# Patient Record
Sex: Male | Born: 1955 | Race: White | Hispanic: No | Marital: Single | Smoking: Current every day smoker
Health system: Southern US, Community
[De-identification: ages and names within clinical notes are randomized; demographics above are authoritative.]

## PROBLEM LIST (undated history)

## (undated) DIAGNOSIS — Z720 Tobacco use: Secondary | ICD-10-CM

## (undated) DIAGNOSIS — F1010 Alcohol abuse, uncomplicated: Secondary | ICD-10-CM

---

## 2019-11-15 ENCOUNTER — Encounter (HOSPITAL_COMMUNITY): Payer: Self-pay

## 2019-11-15 ENCOUNTER — Emergency Department (HOSPITAL_COMMUNITY)
Admission: EM | Admit: 2019-11-15 | Discharge: 2019-11-17 | Disposition: A | Discharge status: Home / Self Care | Payer: No Typology Code available for payment source | Attending: Emergency Medicine | Admitting: Emergency Medicine

## 2019-11-15 ENCOUNTER — Other Ambulatory Visit: Payer: Self-pay

## 2019-11-15 DIAGNOSIS — F1010 Alcohol abuse, uncomplicated: Secondary | ICD-10-CM

## 2019-11-15 DIAGNOSIS — F10920 Alcohol use, unspecified with intoxication, uncomplicated: Secondary | ICD-10-CM

## 2019-11-15 DIAGNOSIS — R45851 Suicidal ideations: Secondary | ICD-10-CM | POA: Insufficient documentation

## 2019-11-15 DIAGNOSIS — F1924 Other psychoactive substance dependence with psychoactive substance-induced mood disorder: Secondary | ICD-10-CM | POA: Diagnosis not present

## 2019-11-15 DIAGNOSIS — Y903 Blood alcohol level of 60-79 mg/100 ml: Secondary | ICD-10-CM | POA: Diagnosis not present

## 2019-11-15 DIAGNOSIS — F1020 Alcohol dependence, uncomplicated: Secondary | ICD-10-CM | POA: Diagnosis not present

## 2019-11-15 DIAGNOSIS — F10229 Alcohol dependence with intoxication, unspecified: Secondary | ICD-10-CM | POA: Diagnosis present

## 2019-11-15 DIAGNOSIS — Z20822 Contact with and (suspected) exposure to covid-19: Secondary | ICD-10-CM | POA: Diagnosis not present

## 2019-11-15 LAB — COMPREHENSIVE METABOLIC PANEL
ALT: 73 U/L — ABNORMAL HIGH (ref 0–44)
AST: 154 U/L — ABNORMAL HIGH (ref 15–41)
Albumin: 3.5 g/dL (ref 3.5–5.0)
Alkaline Phosphatase: 101 U/L (ref 38–126)
Anion gap: 16 — ABNORMAL HIGH (ref 5–15)
BUN: <5 mg/dL — ABNORMAL LOW (ref 8–23)
CO2: 22 mmol/L (ref 22–32)
Calcium: 8.7 mg/dL — ABNORMAL LOW (ref 8.9–10.3)
Chloride: 91 mmol/L — ABNORMAL LOW (ref 98–111)
Creatinine, Ser: 0.71 mg/dL (ref 0.61–1.24)
GFR calc Af Amer: >60 mL/min (ref >60)
GFR calc non Af Amer: >60 mL/min (ref >60)
Glucose, Bld: 97 mg/dL (ref 70–99)
Potassium: 3.6 mmol/L (ref 3.5–5.1)
Sodium: 129 mmol/L — ABNORMAL LOW (ref 135–145)
Total Bilirubin: 1.4 mg/dL — ABNORMAL HIGH (ref 0.3–1.2)
Total Protein: 6.6 g/dL (ref 6.5–8.1)

## 2019-11-15 LAB — CBC WITH DIFFERENTIAL/PLATELET
Abs Immature Granulocytes: 0.01 10*3/uL (ref 0.00–0.07)
Basophils Absolute: 0 10*3/uL (ref 0.0–0.1)
Basophils Relative: 1 %
Eosinophils Absolute: 0 10*3/uL (ref 0.0–0.5)
Eosinophils Relative: 1 %
HCT: 48.9 % (ref 39.0–52.0)
Hemoglobin: 16.9 g/dL (ref 13.0–17.0)
Immature Granulocytes: 0 %
Lymphocytes Relative: 33 %
Lymphs Abs: 1.1 10*3/uL (ref 0.7–4.0)
MCH: 33.9 pg (ref 26.0–34.0)
MCHC: 34.6 g/dL (ref 30.0–36.0)
MCV: 98.2 fL (ref 80.0–100.0)
Monocytes Absolute: 0.3 10*3/uL (ref 0.1–1.0)
Monocytes Relative: 9 %
Neutro Abs: 1.8 10*3/uL (ref 1.7–7.7)
Neutrophils Relative %: 56 %
Platelets: 101 10*3/uL — ABNORMAL LOW (ref 150–400)
RBC: 4.98 MIL/uL (ref 4.22–5.81)
RDW: 13.4 % (ref 11.5–15.5)
WBC: 3.3 10*3/uL — ABNORMAL LOW (ref 4.0–10.5)
nRBC: 0 % (ref 0.0–0.2)

## 2019-11-15 LAB — RAPID URINE DRUG SCREEN, HOSP PERFORMED
Amphetamines: NOT DETECTED
Barbiturates: NOT DETECTED
Benzodiazepines: NOT DETECTED
Cocaine: NOT DETECTED
Opiates: NOT DETECTED
Tetrahydrocannabinol: NOT DETECTED

## 2019-11-15 LAB — ETHANOL: Alcohol, Ethyl (B): 75 mg/dL — ABNORMAL HIGH (ref <10)

## 2019-11-15 LAB — RESPIRATORY PANEL BY RT PCR (FLU A&B, COVID)
Influenza A by PCR: NEGATIVE
Influenza B by PCR: NEGATIVE
SARS Coronavirus 2 by RT PCR: NEGATIVE

## 2019-11-15 MED ORDER — NICOTINE 21 MG/24HR TD PT24
21.0000 mg | MEDICATED_PATCH | Freq: Every day | TRANSDERMAL | Status: DC
  Administered 2019-11-15 – 2019-11-16 (×2): 21 mg via TRANSDERMAL
  Filled 2019-11-15 (×2): qty 1

## 2019-11-15 MED ORDER — THIAMINE HCL 100 MG/ML IJ SOLN: 100.0000 mg | Freq: Every day | INTRAMUSCULAR | Status: DC

## 2019-11-15 MED ORDER — ONDANSETRON HCL 4 MG PO TABS: 4.0000 mg | ORAL_TABLET | Freq: Three times a day (TID) | ORAL | Status: DC | PRN

## 2019-11-15 MED ORDER — LORAZEPAM 1 MG PO TABS
0.0000 mg | ORAL_TABLET | Freq: Four times a day (QID) | ORAL | Status: DC
  Administered 2019-11-15: 2 mg via ORAL
  Filled 2019-11-15: qty 2

## 2019-11-15 MED ORDER — LORAZEPAM 2 MG/ML IJ SOLN: 0.0000 mg | Freq: Four times a day (QID) | INTRAMUSCULAR | Status: DC

## 2019-11-15 MED ORDER — LORAZEPAM 1 MG PO TABS: 0.0000 mg | ORAL_TABLET | Freq: Two times a day (BID) | ORAL | Status: DC

## 2019-11-15 MED ORDER — IBUPROFEN 200 MG PO TABS: 600.0000 mg | ORAL_TABLET | Freq: Three times a day (TID) | ORAL | Status: DC | PRN

## 2019-11-15 MED ORDER — THIAMINE HCL 100 MG PO TABS
100.0000 mg | ORAL_TABLET | Freq: Every day | ORAL | Status: DC
  Administered 2019-11-15 – 2019-11-16 (×2): 100 mg via ORAL
  Filled 2019-11-15 (×2): qty 1

## 2019-11-15 MED ORDER — LORAZEPAM 2 MG/ML IJ SOLN: 0.0000 mg | Freq: Two times a day (BID) | INTRAMUSCULAR | Status: DC

## 2019-11-15 MED ORDER — ALUM & MAG HYDROXIDE-SIMETH 200-200-20 MG/5ML PO SUSP: 30.0000 mL | Freq: Four times a day (QID) | ORAL | Status: DC | PRN

## 2019-11-15 NOTE — ED Triage Notes (Signed)
Pt BIB by [employer]. Pt has been IVC'd by sister due to being unable to care for himself related to excessive ETOH use. Pt has been refusing to care for himself and listen to his family. Family has attempted to help patient but patient continues to refuse and becomes verbally aggressive with them. Pt has been calm and cooperative with officers. Pt appears to be intoxicated at this time. Pt reports having depression and SI over the last year after his mother's passing. Pt reports increased drinking since his mother's passing over a year ago. Pt reports if he "had the guts" he would shoot and kill himself with a gun.

## 2019-11-15 NOTE — ED Provider Notes (Signed)
[facility]-EMERGENCY DEPT Provider Note   CSN: [id number] Arrival date & time: 11/15/19  1353     History Chief Complaint  Patient presents with  . IVC  . Alcohol Intoxication  . Suicidal    William [name] is a 64 y.o. male.  64 year old male with history of alcohol abuse presents under IVC.  Family concerned that patient cannot take care of himself.  Patient also expresses suicidal ideations without definitive plan.  Denies any prior history of suicide attempt he notes he has been more depressed recently.  States that he drinks about 9 beers every day.  Last use of alcohol was today.  Denies any abdominal pain or change in his stools.  No emesis noted.  Has also had poor nutrition as well.        History reviewed. No pertinent past medical history.  There are no problems to display for this patient.   History reviewed. No pertinent surgical history.     History reviewed. No pertinent family history.  Social History   Tobacco Use  . Smoking status: Not on file  Substance Use Topics  . Alcohol use: Not on file  . Drug use: Not on file    Home Medications Prior to Admission medications   Not on File    Allergies    Patient has no allergy information on record.  Review of Systems   Review of Systems  All other systems reviewed and are negative.   Physical Exam Updated Vital Signs BP 101/80 (BP Location: Left Arm)   Pulse 78   Temp 97.6 F (36.4 C) (Oral)   Resp 18   SpO2 99%   Physical Exam Vitals and nursing note reviewed.  Constitutional:      General: He is not in acute distress.    Appearance: Normal appearance. He is well-developed. He is not toxic-appearing.  HENT:     Head: Normocephalic and atraumatic.  Eyes:     General: Lids are normal.     Conjunctiva/sclera: Conjunctivae normal.     Pupils: Pupils are equal, round, and reactive to light.  Neck:     Thyroid: No thyroid mass.     Trachea: No tracheal deviation.    Cardiovascular:     Rate and Rhythm: Normal rate and regular rhythm.     Heart sounds: Normal heart sounds. No murmur. No gallop.   Pulmonary:     Effort: Pulmonary effort is normal. No respiratory distress.     Breath sounds: Normal breath sounds. No stridor. No decreased breath sounds, wheezing, rhonchi or rales.  Abdominal:     General: Bowel sounds are normal. There is no distension.     Palpations: Abdomen is soft.     Tenderness: There is no abdominal tenderness. There is no rebound.  Musculoskeletal:        General: No tenderness. Normal range of motion.     Cervical back: Normal range of motion and neck supple.  Skin:    General: Skin is warm and dry.     Findings: No abrasion or rash.  Neurological:     General: No focal deficit present.     Mental Status: He is alert and oriented to person, place, and time.     GCS: GCS eye subscore is 4. GCS verbal subscore is 5. GCS motor subscore is 6.     Cranial Nerves: No cranial nerve deficit.     Sensory: No sensory deficit.  Psychiatric:  Attention and Perception: Attention normal.        Mood and Affect: Affect is flat.        Speech: Speech normal.        Behavior: Behavior is withdrawn.        Thought Content: Thought content includes suicidal ideation. Thought content does not include suicidal plan.     ED Results / Procedures / Treatments   Labs (all labs ordered are listed, but only abnormal results are displayed) Labs Reviewed  ETHANOL  RAPID URINE DRUG SCREEN, HOSP PERFORMED  CBC WITH DIFFERENTIAL/PLATELET  COMPREHENSIVE METABOLIC PANEL    EKG None  Radiology No results found.  Procedures Procedures (including critical care time)  Medications Ordered in ED Medications - No data to display  ED Course  I have reviewed the triage vital signs and the nursing notes.  Pertinent labs & imaging results that were available during my care of the patient were reviewed by me and considered in my medical  decision making (see chart for details).    MDM Rules/Calculators/A&P                     Will check laboratory studies and medically clear patient and placed on CIWA while and have behavioral health see Final Clinical Impression(s) / ED Diagnoses Final diagnoses:  None    Rx / DC Orders ED Discharge Orders    None       Lacretia Leigh, MD 11/15/19 1453

## 2019-11-16 ENCOUNTER — Encounter (HOSPITAL_COMMUNITY): Payer: Self-pay | Admitting: *Deleted

## 2019-11-16 NOTE — ED Notes (Signed)
Pt provided with meal tray at this time 

## 2019-11-16 NOTE — Progress Notes (Addendum)
CSW called back to insure pt has no guns in his home or at his disposal and his sister Clydie Braun stated no, that pt does not own, nor has access to any guns or weapons.  CSW will continue to follow for D/C needs.  Dorothe Pea. Brek Reece  MSW, LCSW, LCAS, CSI Transitions of Care Clinical Social Worker Care Coordination Department Ph: [phone]

## 2019-11-16 NOTE — Progress Notes (Addendum)
[facility] did not receive pt's referral for SUD [place].  CSW resent to : [phone].  CSW spoke to [facility] who stated pt was denied due to a need for physical assistance.  EDP updated.  CSW will continue to follow for D/C needs.  Dorothe Pea. Martie Muhlbauer  MSW, LCSW, LCAS, CSI Transitions of Care Clinical Social Worker Care Coordination Department Ph: [phone]

## 2019-11-16 NOTE — ED Notes (Signed)
Patient resting comfortably. Per Nicola Girt with social work, patient will go home with home health if sister can agree to regularly check on patient and get home set up for patient to return. Will call TCU and let this RN know when patient can be transported home.

## 2019-11-16 NOTE — Progress Notes (Addendum)
CSW updated pt's sister Clydie Braun at ph: [phone] who voiced understanding that pt would be D/C'd and was agreeable to being present when the pt arrives via PTAR to assist him in getting comfortable for the night in his trailer.  Per pt's sister Clydie Braun, pt's sister Diane and Diane's son lives on the same property as the pt and can also check in on the pt as well as be present when the pt arrives.  Per pt's sister Clydie Braun, pt's sister Diane and her son will go to the pt's trailer and clean up prior to the pt returning due to there being, "beer cans everywhere, even covering the floor", per pt's sister.  8:54 PM CSW received a call from pt's brother-in-law who accepted information for recovery options including [facility] in [place], [place].  Pt's brother stated the CSW calling APS will not be helpful as pt has a hx of not accepting help or opening his door when help is offered.  CSW will continue to follow for D/C needs.  Dorothe Pea. Vianey Caniglia  MSW, LCSW, LCAS, CSI Transitions of Care Clinical Social Worker Care Coordination Department Ph: [phone]

## 2019-11-16 NOTE — ED Notes (Signed)
TTS assessment being done at this time.  

## 2019-11-16 NOTE — ED Notes (Signed)
Sitter remains at bedside. Pt is calm and cooperative at this time. Pt is alert and oriented. Will continue to monitor.

## 2019-11-16 NOTE — ED Notes (Signed)
TTS consulted with pt at this time. Peer support ordered for this pt.

## 2019-11-16 NOTE — ED Notes (Signed)
[name] sister would like an update, [phone].

## 2019-11-16 NOTE — Progress Notes (Signed)
Case mgmt thought this man would appreciate a visit.  Chaplain met him and had conversation.  He asked about what chaplains do and I said  We can be like a pastor in a hospital.  He started expressing guilt of not going to church - used to go regularly but then  he started drinking.  I helped him understand lots of people have something that they use to help them with difficult things.(Eating, drinking, etc.   He was really hard on himself .  He referred to being in a deep dark place and hard to seek any way out.. I sought to encourage him to do to let the program help him and do what he can to come out of that dark place. We talked about God loving his children no matter who they are and what has happened.  He warmed up to that.  He said his sister set it up cause she was worried about him.  He is on day 2  I think and he said he had been in a hallway somewhere upstairs yesterday and how he did not like that and couldn't sleep with all the hospital noises and patients and being out  in the open.  He liked the room he was in much better in the ED 32   He invited me to come back anytime that he would love to have another visit.   He welcomed me to pray with him.  I had prayer being thankful for the good days and for the tough days when they come that he could look at them as only temporary days.  Its like a book and he is writing a new chapter.  He can turn the page and no looking back unless he wants to.  He seemed encouraged.  S , Chaplain    11/16/19 1400  Clinical Encounter Type  Visited With Patient  Visit Type Initial;Spiritual support;Other (Comment)  Referral From Care management  Consult/Referral To Chaplain  Spiritual Encounters  Spiritual Needs Prayer;Other (Comment)  Stress Factors  Patient Stress Factors Other (Comment) (police came knocking on his door)   

## 2019-11-16 NOTE — ED Provider Notes (Signed)
9:45 p.m.-I was informed by transition of care team that the patient cannot be placed for treatment and that he is comfortable with discharge.  At this time he is alert and sober.  He understands it is important to stop drinking understands the mechanisms to do that.  Family members are apparently on the way to pick him up.   Mancel Bale, MD 11/16/19 2156

## 2019-11-16 NOTE — Evaluation (Addendum)
Physical Therapy Evaluation Patient Details Name: William [name] MRN: [id number] DOB: Jul 09, 1956 Today's Date: 11/16/2019   History of Present Illness  64 year old male with history of alcohol abuse presents under IVC.  Family concerned that patient cannot take care of himself.  Patient also expresses suicidal ideations without definitive plan.  Denies any prior history of suicide attempt he notes he has been more depressed recently. IVC rescinded.  Clinical Impression  The patient is very pleasant. Presents with decreased balance responses with ambulation if not supporting with UE. He may benefit from A Rw for stability while recovering strength. Patient did best with RW while ambulating. Patient could benefit from a short term rehab to improve strength  And independence. Noted to cruise on walls /bed in room without device. Pt admitted with above diagnosis.  Pt currently with functional limitations due to the deficits listed below (see PT Problem List). Pt will benefit from skilled PT to increase their independence and safety with mobility to allow discharge to the venue listed below.   .    Follow Up Recommendations SNF;Supervision for mobility/OOBif agreeable and eligible otherwise home with HHPT and RW and some support      Equipment Recommendations  Rolling walker with 5" wheels    Recommendations for Other Services       Precautions / Restrictions Precautions Precautions: Fall      Mobility  Bed Mobility Overal bed mobility: Independent                Transfers Overall transfer level: Needs assistance Equipment used: None Transfers: Sit to/from Stand Sit to Stand: Supervision         General transfer comment: extra effort from low seat. bed raised and stood, wide base, knees flexed  Ambulation/Gait Ambulation/Gait assistance: Min guard;Min assist Gait Distance (Feet): 80 Feet Assistive device: Rolling walker (2 wheeled) Gait Pattern/deviations: Step-through  pattern;Shuffle;Trunk flexed Gait velocity: decr   General Gait Details: ambulated x 30' without Ad with noted slower speed, reaching for walls to steady self  Stairs            Wheelchair Mobility    Modified Rankin (Stroke Patients Only)       Balance Overall balance assessment: History of Falls;Needs assistance   Sitting balance-Leahy Scale: Good     Standing balance support: No upper extremity supported;During functional activity Standing balance-Leahy Scale: Poor Standing balance comment: static is fair, duynamic without  holding on with 1 UE is poor.                             Pertinent Vitals/Pain Pain Assessment: Faces Faces Pain Scale: Hurts little more Pain Location: back Pain Descriptors / Indicators: Discomfort Pain Intervention(s): Monitored during session    Home Living Family/patient expects to be discharged to:: Private residence Living Arrangements: Alone Available Help at Discharge: Family;Available PRN/intermittently Type of Home: Mobile home Home Access: Stairs to enter Entrance Stairs-Rails: Psychiatric nurse of Steps: 3 Home Layout: One level Home Equipment: None      Prior Function Level of Independence: Independent         Comments: drives     Hand Dominance        Extremity/Trunk Assessment   Upper Extremity Assessment Upper Extremity Assessment: Overall WFL for tasks assessed    Lower Extremity Assessment Lower Extremity Assessment: Generalized weakness    Cervical / Trunk Assessment Cervical / Trunk Assessment: Kyphotic  Communication   Communication: No  difficulties  Cognition Arousal/Alertness: Awake/alert Behavior During Therapy: WFL for tasks assessed/performed Overall Cognitive Status: Within Functional Limits for tasks assessed                                 General Comments: knows hospital setting and date      General Comments      Exercises General  Exercises - Lower Extremity Long Arc Quad: 10 reps;AROM Heel Slides: Seated Hip Flexion/Marching: 10 reps;Both;Seated   Assessment/Plan    PT Assessment Patient needs continued PT services  PT Problem List Decreased strength;Decreased balance;Decreased knowledge of precautions;Decreased mobility;Decreased knowledge of use of DME;Decreased activity tolerance;Decreased skin integrity       PT Treatment Interventions DME instruction;Functional mobility training;Gait training;Therapeutic activities;Therapeutic exercise    PT Goals (Current goals can be found in the Care Plan section)  Acute Rehab PT Goals Patient Stated Goal: I want to go somewhere to stop drinking PT Goal Formulation: With patient Time For Goal Achievement: 11/30/19 Potential to Achieve Goals: Fair    Frequency Min 3X/week   Barriers to discharge Decreased caregiver support      Co-evaluation               AM-PAC PT "6 Clicks" Mobility  Outcome Measure Help needed turning from your back to your side while in a flat bed without using bedrails?: None Help needed moving from lying on your back to sitting on the side of a flat bed without using bedrails?: None Help needed moving to and from a bed to a chair (including a wheelchair)?: A Little Help needed standing up from a chair using your arms (e.g., wheelchair or bedside chair)?: A Little Help needed to walk in hospital room?: A Little Help needed climbing 3-5 steps with a railing? : A Lot 6 Click Score: 19    End of Session Equipment Utilized During Treatment: Gait belt Activity Tolerance: Patient tolerated treatment well Patient left: in bed;with nursing/sitter in room Nurse Communication: Mobility status PT Visit Diagnosis: Unsteadiness on feet (R26.81);Difficulty in walking, not elsewhere classified (R26.2);Repeated falls (R29.6)    Time: 6767-2094 PT Time Calculation (min) (ACUTE ONLY): 31 min   Charges:   PT Evaluation $PT Eval Low  Complexity: 1 Low PT Treatments $Gait Training: 8-22 mins        Blanchard Kelch PT Acute Rehabilitation Services Pager [phone] Office [phone]   Rada Hay 11/16/2019, 3:25 PM

## 2019-11-16 NOTE — Progress Notes (Signed)
RN updated and will update pt's sister Clydie Braun at ph: [phone] whe PTAR is in transit to the pt's home with the pt.  CSW will continue to follow for D/C needs.  Dorothe Pea. Haruto Demaria  MSW, LCSW, LCAS, CSI Transitions of Care Clinical Social Worker Care Coordination Department Ph: [phone]

## 2019-11-16 NOTE — Consult Note (Signed)
William [name]  11/16/2019 11:14 AM William [name]  MRN:  [id number] Principal Problem: <principal problem not specified> [name] Diagnoses: Active Problems:   * No active hospital problems. *   Subjective: "My sister worries because I was drinking too much and falling a lot."  Patient assessed by nurse practitioner along with Dr. Lucianne Muss.  Patient alert and oriented, answers appropriately. Patient denies suicidal and homicidal ideations. Patient denies history of suicide attempts, denies history of self-harm behaviors. Patient denies auditory and visual hallucinations. Patient denies symptoms of paranoia.  Patient reports he lives alone in [place].  Patient denies access to weapons.  Patient reports independent of activities of daily living.  Patient denies use of assistive devices. Patient reports desire to "cut down on drinking and cigarette smoking."     Total Time spent with patient: 20 minutes  Past Psychiatric History:   Past Medical History: History reviewed. No pertinent past medical history. History reviewed. No pertinent surgical history. Family History: History reviewed. No pertinent family history. Family Psychiatric  History: Denies Social History:  Social History   Substance and Sexual Activity  Alcohol Use None     Social History   Substance and Sexual Activity  Drug Use Not on file    Social History   Socioeconomic History  . Marital status: Single    Spouse name: Not on file  . Number of children: Not on file  . Years of education: Not on file  . Highest education level: Not on file  Occupational History  . Not on file  Tobacco Use  . Smoking status: Not on file  Substance and Sexual Activity  . Alcohol use: Not on file  . Drug use: Not on file  . Sexual activity: Not on file  Other Topics Concern  . Not on file  Social History Narrative  . Not on file   Social Determinants of Health   Financial Resource Strain:   . Difficulty of  Paying Living Expenses:   Food Insecurity:   . Worried About Programme researcher, broadcasting/film/video in the Last Year:   . Barista in the Last Year:   Transportation Needs:   . Freight forwarder (Medical):   Marland Kitchen Lack of Transportation (Non-Medical):   Physical Activity:   . Days of Exercise per Week:   . Minutes of Exercise per Session:   Stress:   . Feeling of Stress :   Social Connections:   . Frequency of Communication with Friends and Family:   . Frequency of Social Gatherings with Friends and Family:   . Attends Religious Services:   . Active Member of Clubs or Organizations:   . Attends Banker Meetings:   Marland Kitchen Marital Status:     Has this patient used any form of tobacco in the last 30 days? (Cigarettes, Smokeless Tobacco, Cigars, and/or Pipes) A prescription for an FDA-approved tobacco cessation medication was offered at [name] and the patient refused  Current Medications: Current Facility-Administered Medications  Medication Dose Route Frequency Provider Last Rate Last Admin  . alum & mag hydroxide-simeth (MAALOX/MYLANTA) 200-200-20 MG/5ML suspension 30 mL  30 mL Oral Q6H PRN Lorre Nick, MD      . ibuprofen (ADVIL) tablet 600 mg  600 mg Oral Q8H PRN Lorre Nick, MD      . LORazepam (ATIVAN) injection 0-4 mg  0-4 mg Intravenous Q6H Lorre Nick, MD       Or  . LORazepam (ATIVAN) tablet 0-4 mg  0-4 mg Oral Q6H Lorre Nick, MD   2 mg at 11/15/19 2121  . [START ON 11/17/2019] LORazepam (ATIVAN) injection 0-4 mg  0-4 mg Intravenous Q12H Lorre Nick, MD       Or  . Melene Muller ON 11/17/2019] LORazepam (ATIVAN) tablet 0-4 mg  0-4 mg Oral Q12H Lorre Nick, MD      . nicotine (NICODERM CQ - dosed in mg/24 hours) patch 21 mg  21 mg Transdermal Daily Lorre Nick, MD   21 mg at 11/16/19 1043  . ondansetron (ZOFRAN) tablet 4 mg  4 mg Oral Q8H PRN Lorre Nick, MD      . thiamine tablet 100 mg  100 mg Oral Daily Lorre Nick, MD   100 mg at 11/16/19 1043   Or  .  thiamine (B-1) injection 100 mg  100 mg Intravenous Daily Lorre Nick, MD       No current outpatient medications on file.   PTA Medications: (Not in a hospital admission)   Musculoskeletal: Strength & Muscle Tone: within normal limits Gait & Station: unsteady Patient leans: unable to assess  Psychiatric Specialty Exam: Physical Exam Vitals and nursing note reviewed.  Constitutional:      Appearance: He is well-developed.  HENT:     Head: Normocephalic.  Cardiovascular:     Rate and Rhythm: Normal rate.  Pulmonary:     Effort: Pulmonary effort is normal.  Neurological:     Mental Status: He is alert and oriented to person, place, and time.  Psychiatric:        Attention and Perception: Attention normal.        Mood and Affect: Mood is depressed.        Speech: Speech normal.        Behavior: Behavior normal. Behavior is cooperative.        Thought Content: Thought content normal.        Cognition and Memory: Cognition normal.        Judgment: Judgment normal.     Review of Systems  Constitutional: Negative.   HENT: Negative.   Eyes: Negative.   Respiratory: Negative.   Cardiovascular: Negative.   Gastrointestinal: Negative.   Genitourinary: Negative.   Musculoskeletal: Negative.   Skin: Negative.   Neurological: Negative.   Psychiatric/Behavioral: Negative.     Blood pressure (!) 107/52, pulse 78, temperature 97.9 F (36.6 C), temperature source Oral, resp. rate 16, SpO2 100 %.There is no height or weight on file to calculate BMI.  General Appearance: Casual  Eye Contact:  Fair  Speech:  Clear and Coherent and Normal Rate  Volume:  Normal  Mood:  Depressed  Affect:  Appropriate and Congruent  Thought Process:  Coherent, Goal Directed and Descriptions of Associations: Intact  Orientation:  Full (Time, Place, and Person)  Thought Content:  WDL and Logical  Suicidal Thoughts:  No  Homicidal Thoughts:  No  Memory:  Immediate;   Good Recent;    Good Remote;   Good  Judgement:  Fair  Insight:  Good  Psychomotor Activity:  Normal  Concentration:  Concentration: Good and Attention Span: Good  Recall:  Good  Fund of Knowledge:  Good  Language:  Good  Akathisia:  No  Handed:  Right  AIMS (if indicated):     Assets:  Communication Skills Desire for Improvement Financial Resources/Insurance Housing Intimacy Leisure Time Physical Health Resilience Social Support Transportation  ADL's:  Unable to assess   Cognition:  WNL  Sleep:  Demographic Factors:  Male and Caucasian  Loss Factors: NA  Historical Factors: NA  Risk Reduction Factors:   Positive social support, Positive therapeutic relationship and Positive coping skills or problem solving skills  Continued Clinical Symptoms:  Alcohol/Substance Abuse/Dependencies  Cognitive Features That Contribute To Risk:  None    Suicide Risk:  Minimal: No identifiable suicidal ideation.  Patients presenting with no risk factors but with morbid ruminations; may be classified as minimal risk based on the severity of the depressive symptoms    Plan Of Care/Follow-up recommendations:  Other:  Follow up with substance use treatment resources provided by Peer Support Specialist  Disposition:  Cleared by psychiatry Emmaline Kluver, [place] 11/16/2019, 11:14 AM

## 2019-11-16 NOTE — Progress Notes (Addendum)
  TOC CM contacted [facility] rep, Kathlene November and arranged HH. States it takes 7-10 days to get auth from [place] for [facility]. Contacted [facility] for RW for dc as recommended by PT. RW will be delivered to his room 32. Waiting on disposition Detox Program at [facility] with [facility]. Chaplain consulted for Spiritual Needs. Pt states he is ready to get better and stop drinking. Isidoro Donning RN CCM, [facility] ED TOC Caryl Ada [phone]  11/20/2019 late entry [employer] confirmed sister paid out of pocket for pt to receive RW for home. Isidoro Donning RN CCM, [facility] ED TOC CM [phone]

## 2019-11-16 NOTE — TOC Initial Note (Addendum)
Transition of Care [facility]) - Initial/Assessment Note    Patient Details  Name: William [name] MRN: [id number] Date of Birth: 1955-12-31  Transition of Care [facility]) CM/SW Contact:    Erenest Rasher, RN Phone Number: [phone] 11/16/2019, 1:36 PM  Clinical Narrative:                 TOC CM spoke to pt and states he really would like to get help with the drinking. He feels more depressed after his mother passed a year ago. He has two sisters who are concerned about his well-being and excessive drinking. Gave permission to speak to Santiago Glad and Shauna Hugh to give updates. Waiting response back to see if pt can go to IP substance abuse program. PT evaluation for gait and needed DME. Pt states he still drives. Goes to [facility], [facility] and send progress notes to his PCP. He receives a monthly check, and lives in his own mobile home.   TOC CM contacted sister and gave her an update on waiting acceptance at IP rehab program.     Expected Discharge Plan: IP Rehab Facility Barriers to Discharge: Active Substance Use - Placement   Patient Goals and CMS Choice Patient states their goals for this hospitalization and ongoing recovery are:: patient feels he wants to get better      Expected Discharge Plan and Services Expected Discharge Plan: [place] In-house Referral: Clinical Social Work Discharge Planning Services: CM Consult   Living arrangements for the past 2 months: Mobile Home                                      Prior Living Arrangements/Services Living arrangements for the past 2 months: Mobile Home Lives with:: Self          Need for Family Participation in Patient Care: Yes (Comment) Care giver support system in place?: No (comment)   Criminal Activity/Legal Involvement Pertinent to Current Situation/Hospitalization: No - Comment as needed  Activities of Daily Living Home Assistive Devices/Equipment: None ADL Screening (condition at  time of admission) Patient's cognitive ability adequate to safely complete daily activities?: Yes Is the patient deaf or have difficulty hearing?: No Does the patient have difficulty seeing, even when wearing glasses/contacts?: No Does the patient have difficulty concentrating, remembering, or making decisions?: No Patient able to express need for assistance with ADLs?: Yes Does the patient have difficulty dressing or bathing?: No Independently performs ADLs?: Yes (appropriate for developmental age) Does the patient have difficulty walking or climbing stairs?: No Weakness of Legs: None Weakness of Arms/Hands: None  Permission Sought/Granted Permission sought to share information with : Case Manager, Customer service manager, PCP, Family Supports Permission granted to share information with : Yes, Verbal Permission Granted  Share Information with NAME: Denny Levy  Permission granted to share info w AGENCY: IP Substance Abuse Facilities  Permission granted to share info w Relationship: sister  Permission granted to share info w Contact Information: [phone]  Emotional Assessment Appearance:: Appears older than stated age Attitude/Demeanor/Rapport: Engaged Affect (typically observed): Accepting, Pleasant Orientation: : Oriented to Self, Oriented to Place, Oriented to  Time, Oriented to Situation Alcohol / Substance Use: Tobacco Use, Alcohol Use Psych Involvement: No (comment)  Admission diagnosis:  IVC There are no problems to display for this patient.  PCP:  Clinic, [place]:   [id number] - [place], [place] -  [address] [place] [address] Phone: [phone] Fax: [phone]     Social Determinants of Health (SDOH) Interventions    Readmission Risk Interventions No flowsheet data found.

## 2019-11-16 NOTE — Discharge Instructions (Signed)
For your behavioral health needs, you are advised to follow up with the [facility].  Contact them at your earliest opportunity for an appointment:       [facility]      [address] [place], [place] [address]      [phone]  For supportive services for veterans in the [place] area, contact the [facility]:       [facility]      [address], [address]      [place], [place] [address]      [phone]      Hours of operation: 8:00 am - 7:00 pm, Monday - Friday  For crisis services for veterans, call the [employer], available 24 hours a day, 7 days a week:       [employer] [facility]      [phone]

## 2019-11-16 NOTE — ED Provider Notes (Signed)
Emergency Medicine Observation Re-evaluation Note  Dario Yono is a 64 y.o. male, seen on rounds today.  Pt initially presented to the ED for complaints of IVC, Alcohol Intoxication, and Suicidal Currently, the patient is in bed with no new complaints.  Physical Exam  BP (!) 113/92 (BP Location: Right Arm)   Pulse 79   Temp 98 F (36.7 C) (Oral)   Resp 16   SpO2 100%  Physical Exam Sitting up in hallway bed in no distress, resting comfortably   ED Course / MDM  EKG:    I have reviewed the labs performed to date as well as medications administered while in observation.  Recent changes in the last 24 hours include TTS eval awaiting reassessment in am. Plan  Current plan is for reassessment this am. Patient is under full IVC at this time.   Milagros Loll, MD 11/16/19 [phone]

## 2019-11-16 NOTE — ED Notes (Signed)
Sitter at bedside, pt calm and cooperative at this time.

## 2019-11-16 NOTE — Patient Outreach (Signed)
ED Peer Support Specialist Patient Intake (Complete at intake & 30-60 Day Follow-up)  Name: William [name]  MRN: [id number]  Age: 64 y.o.   Date of Admission: 11/16/2019  Intake:   Comments:      Primary Reason Admitted: Alcohol Intoxication   Lab values: Alcohol/ETOH:   Positive UDS?   Amphetamines:   Barbiturates:   Benzodiazepines:   Cocaine:   Opiates:   Cannabinoids:    Demographic information: Gender:   Ethnicity:   Marital Status:   Insurance Status:   Control and instrumentation engineer (Work Engineer, agricultural, Sales executive, etc.:   Lives with:   Living situation:    Reported Patient History: Patient reported health conditions:   Patient aware of HIV and hepatitis status:    In past year, has patient visited ED for any reason?    Number of ED visits:    Reason(s) for visit:    In past year, has patient been hospitalized for any reason?    Number of hospitalizations:    Reason(s) for hospitalization:    In past year, has patient been arrested?    Number of arrests:    Reason(s) for arrest:    In past year, has patient been incarcerated?    Number of incarcerations:    Reason(s) for incarceration:    In past year, has patient received medication-assisted treatment?    In past year, patient received the following treatments:    In past year, has patient received any harm reduction services?    Did this include any of the following?    In past year, has patient received care from a mental health provider for diagnosis other than SUD?    In past year, is this first time patient has overdosed?    Number of past overdoses:    In past year, is this first time patient has been hospitalized for an overdose?    Number of hospitalizations for overdose(s):    Is patient currently receiving treatment for a mental health diagnosis?    Patient reports experiencing difficulty participating in SUD treatment:      Most important reason(s) for this difficulty?     Has patient received prior services for treatment?    In past, patient has received services from following agencies:    Plan of Care:  Suggested follow up at these agencies/treatment centers:    Other information: Stated that he is tired of drinking so much an that he wants to seek help for his alcohol intake. [facility] is faxing Pt information to [facility] for detox services, and are waiting for reply.   Arlys John Rockie Vawter, CPSS  11/16/2019 12:50 PM

## 2019-11-16 NOTE — BH Assessment (Addendum)
Assessment Note  William [name] is an 64 y.o. male presenting to [facility] ED under IVC. Per triage note Pt BIB by [employer]. Pt has been IVC'd by sister due to being unable to care for himself related to excessive ETOH use. Pt has been refusing to care for himself and listen to his family. Family has attempted to help patient but patient continues to refuse and becomes verbally aggressive with them. Pt has been calm and cooperative with officers. Pt appears to be intoxicated at this time. Pt reports having depression and SI over the last year after his mother's passing. Pt reports increased drinking since his mother's passing over a year ago. Pt reports if he "had the guts" he would shoot and kill himself with a gun. During assessment patient was alert and oriented x4, calm and cooperative, appeared depressed with a flat affect, appearance was disheveled. When asked why patient was presenting to ED patient reported "My sister wanted me to come to the ED she's concerned about my health, drinking and smoking. I drink 24/7. I drink beer, I drink 9 beers 25oz a day."  "I've been falling a lot lately and not eating. 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 Sometimes I want hurt myself. My depression and drinking have gotten worse within the last 6 months."  Patient reported his age of first use for alcohol at 71.  Patient currently denies any withdrawal symptoms currently. Patient reports the only time he stopped drinking was "a year and a half ago for about 1 month." Patient currently denies SI/HI/AH/VH and does not appear to be responding to any internal or external stimuli.  Per NP Ada patient is recommended for observation overnight and reassess in the morning  Diagnosis: Substance-Induced Mood Disorder,  Alcohol use Disorder, Severe  Past Medical History: History reviewed. No pertinent past medical history.  History reviewed. No pertinent surgical history.  Family History: History reviewed. No pertinent family history.  Social History:  has no history on file for tobacco, alcohol, and drug.  Additional Social History:  Alcohol / Drug Use Pain Medications: See MAR Prescriptions: See MAR Over the Counter: See MAR History of alcohol / drug use?: Yes Substance #1 Name of Substance 1: Alcohol  CIWA: CIWA-Ar BP: (!) 92/55 Pulse Rate: 98 Nausea and Vomiting: no nausea and no vomiting Tactile Disturbances: none Tremor: no tremor Auditory Disturbances: not present Paroxysmal Sweats: no sweat visible Visual Disturbances: not present Anxiety: no anxiety, at ease Headache, Fullness in Head: none present Agitation: normal activity Orientation and Clouding of Sensorium: oriented and can do serial additions CIWA-Ar Total: 0 COWS:    Allergies: No Known Allergies  Home Medications: (Not in a hospital admission)   OB/GYN Status:  No LMP for male patient.  General Assessment Data [facility] of [facility]: [facility] TTS Assessment: In system Is this a Tele or Face-to-Face Assessment?: Tele Assessment Is this an Initial Assessment or a Re-assessment for this encounter?: Initial Assessment Patient Accompanied by:: N/A Language Other than English: No Living Arrangements: Other (Comment)(Private Residence) What gender do you identify as?: Male Marital status: Single Living Arrangements: Alone Can pt return to current living arrangement?: Yes Admission Status: Involuntary Petitioner: Police Is patient capable of signing voluntary admission?: No Referral Source: Other Insurance type: Therapist, sports Exam [facility]  Walk-in ONLY) Medical Exam completed: Yes  Crisis Care Plan Living Arrangements: Alone Legal Guardian: Other:(Self) Name of Psychiatrist: None Name  of Therapist: None  Education Status Is patient currently in school?: No Is the patient employed, unemployed or receiving disability?: Unemployed  Risk to self with the past 6 months Suicidal Ideation: No Has patient been a risk to self within the past 6 months prior to admission? : No Suicidal Intent: No Has patient had any suicidal intent within the past 6 months prior to admission? : No Is patient at risk for suicide?: No Suicidal Plan?: No Has patient had any suicidal plan within the past 6 months prior to admission? : No Access to Means: No What has been your use of drugs/alcohol within the last 12 months?: Alcohol Abuse Previous Attempts/Gestures: No How many times?: 0 Other Self Harm Risks: NOne Triggers for Past Attempts: None known Intentional Self Injurious Behavior: None Family Suicide History: No Recent stressful life event(s): Other (Comment)(None reported) Persecutory voices/beliefs?: No Depression: Yes Depression Symptoms: Isolating, Loss of interest in usual pleasures, Feeling worthless/self pity Substance abuse history and/or treatment for substance abuse?: Yes Suicide prevention information given to non-admitted patients: Not applicable  Risk to Others within the past 6 months Homicidal Ideation: No Does patient have any lifetime risk of violence toward others beyond the six months prior to admission? : No Thoughts of Harm to Others: No Current Homicidal Intent: No Current Homicidal Plan: No Access to Homicidal Means: No Identified Victim: None History of harm to others?: No Assessment of Violence: None Noted Does patient have access to weapons?: No Criminal Charges Pending?: No Does patient have a court date: No Is patient on probation?: No  Psychosis Hallucinations: None noted Delusions: None noted  Mental Status Report Appearance/Hygiene: In scrubs Eye Contact: Fair Motor Activity: Freedom of movement Speech: Logical/coherent Level of  Consciousness: Alert Mood: Depressed Affect: Flat Anxiety Level: None Thought Processes: Coherent Judgement: Unimpaired Orientation: Person, Place, Situation, Time, Appropriate for developmental age Obsessive Compulsive Thoughts/Behaviors: None  Cognitive Functioning Concentration: Normal Memory: Recent Intact, Remote Intact Is patient IDD: No Insight: Fair Impulse Control: Fair Appetite: Fair Have you had any weight changes? : No Change Sleep: No Change Total Hours of Sleep: 5 Vegetative Symptoms: None  ADLScreening [facility] Assessment Services) Patient's cognitive ability adequate to safely complete daily activities?: Yes Patient able to express need for assistance with ADLs?: Yes Independently performs ADLs?: Yes (appropriate for developmental age)  Prior Inpatient Therapy Prior Inpatient Therapy: No  Prior Outpatient Therapy Prior Outpatient Therapy: Yes Prior Therapy Dates: Unknown Prior Therapy Facilty/Provider(s): Unknown Reason for Treatment: Unknown Does patient have an ACCT team?: No Does patient have Intensive In-House Services?  : No Does patient have [facility] services? : No Does patient have P4CC services?: No  ADL Screening (condition at time of admission) Patient's cognitive ability adequate to safely complete daily activities?: Yes Is the patient deaf or have difficulty hearing?: No Does the patient have difficulty seeing, even when wearing glasses/contacts?: No Does the patient have difficulty concentrating, remembering, or making decisions?: No Patient able to express need for assistance with ADLs?: Yes Does the patient have difficulty dressing or bathing?: No Independently performs ADLs?: Yes (appropriate for developmental age) Does the patient have difficulty walking or climbing stairs?: No Weakness of Legs: None Weakness of Arms/Hands: None  Home Assistive Devices/Equipment Home Assistive Devices/Equipment: None  Therapy Consults (therapy consults  require a physician order) PT Evaluation Needed: No OT Evalulation Needed: No SLP Evaluation Needed:  No Abuse/Neglect Assessment (Assessment to be complete while patient is alone) Abuse/Neglect Assessment Can Be Completed: Yes Physical Abuse: Denies Verbal Abuse: Denies Sexual Abuse: Denies Exploitation of patient/patient's resources: Denies Self-Neglect: Denies Values / Beliefs Cultural Requests During Hospitalization: None Spiritual Requests During Hospitalization: None Consults Spiritual Care Consult Needed: No Transition of Care Team Consult Needed: No Advance Directives (For Healthcare) Does Patient Have a Medical Advance Directive?: No Would patient like information on creating a medical advance directive?: No - Patient declined          Disposition: Per NP Ada patient is recommended for observation overnight and reassess in the morning Disposition Initial Assessment Completed for this Encounter: Yes  On Site Evaluation by:   Reviewed with Physician:    Benay Pike MS LCASA 11/16/2019 1:41 AM

## 2019-11-16 NOTE — BH Assessment (Signed)
[facility] Assessment Progress Note  Per Berneice Heinrich, FNP, this pt does not require psychiatric hospitalization at this time.  Pt presents under IVC initiated by sister and upheld by EDP Lorre Nick, MD, which has been rescinded by Nelly Rout, MD.  Pt is to be discharged from [facility].  Discharge instructions advise pt to follow up with the [facility], and also include other area supportive services for veterans.  Pt would also benefit from seeing Peer Support Specialists, and a peer support consult has been ordered for pt..  Pt's nurse, Dekina, has been notified.  Doylene Canning, MA Triage Specialist [phone]

## 2019-11-20 ENCOUNTER — Telehealth: Payer: Self-pay | Admitting: *Deleted

## 2019-11-20 NOTE — Telephone Encounter (Signed)
TOC CM received call from [facility], Kathlene November, agency spoke to [facility] and pt has not seen PCP in 2 years. He will need to establish with PCP before they can authorize [facility] visits. TOC CM contacted pt and sister left HIPAA compliant message for a return call. Isidoro Donning RN CCM, [facility] ED TOC CM [phone]

## 2019-12-05 ENCOUNTER — Encounter: Payer: Self-pay | Admitting: Emergency Medicine

## 2019-12-05 ENCOUNTER — Emergency Department: Payer: No Typology Code available for payment source

## 2019-12-05 ENCOUNTER — Inpatient Hospital Stay
Admission: EM | Admit: 2019-12-05 | Discharge: 2019-12-18 | DRG: 640 | Disposition: A | Discharge status: Home / Self Care | Payer: No Typology Code available for payment source | Attending: Internal Medicine | Admitting: Internal Medicine

## 2019-12-05 ENCOUNTER — Other Ambulatory Visit: Payer: Self-pay

## 2019-12-05 DIAGNOSIS — R4182 Altered mental status, unspecified: Secondary | ICD-10-CM | POA: Diagnosis not present

## 2019-12-05 DIAGNOSIS — F1020 Alcohol dependence, uncomplicated: Secondary | ICD-10-CM | POA: Diagnosis present

## 2019-12-05 DIAGNOSIS — J441 Chronic obstructive pulmonary disease with (acute) exacerbation: Secondary | ICD-10-CM

## 2019-12-05 DIAGNOSIS — D6959 Other secondary thrombocytopenia: Secondary | ICD-10-CM | POA: Diagnosis present

## 2019-12-05 DIAGNOSIS — F1010 Alcohol abuse, uncomplicated: Secondary | ICD-10-CM

## 2019-12-05 DIAGNOSIS — G92 Toxic encephalopathy: Secondary | ICD-10-CM | POA: Diagnosis present

## 2019-12-05 DIAGNOSIS — R0602 Shortness of breath: Secondary | ICD-10-CM

## 2019-12-05 DIAGNOSIS — J9601 Acute respiratory failure with hypoxia: Secondary | ICD-10-CM

## 2019-12-05 DIAGNOSIS — F1029 Alcohol dependence with unspecified alcohol-induced disorder: Secondary | ICD-10-CM

## 2019-12-05 DIAGNOSIS — K5900 Constipation, unspecified: Secondary | ICD-10-CM | POA: Diagnosis present

## 2019-12-05 DIAGNOSIS — Z66 Do not resuscitate: Secondary | ICD-10-CM | POA: Diagnosis present

## 2019-12-05 DIAGNOSIS — E512 Wernicke's encephalopathy: Secondary | ICD-10-CM | POA: Diagnosis not present

## 2019-12-05 DIAGNOSIS — R0603 Acute respiratory distress: Secondary | ICD-10-CM

## 2019-12-05 DIAGNOSIS — G9341 Metabolic encephalopathy: Secondary | ICD-10-CM | POA: Diagnosis present

## 2019-12-05 DIAGNOSIS — R000 Tachycardia, unspecified: Secondary | ICD-10-CM

## 2019-12-05 DIAGNOSIS — R1310 Dysphagia, unspecified: Secondary | ICD-10-CM | POA: Diagnosis present

## 2019-12-05 DIAGNOSIS — K760 Fatty (change of) liver, not elsewhere classified: Secondary | ICD-10-CM

## 2019-12-05 DIAGNOSIS — E876 Hypokalemia: Secondary | ICD-10-CM

## 2019-12-05 DIAGNOSIS — F17210 Nicotine dependence, cigarettes, uncomplicated: Secondary | ICD-10-CM | POA: Diagnosis present

## 2019-12-05 DIAGNOSIS — Z20822 Contact with and (suspected) exposure to covid-19: Secondary | ICD-10-CM | POA: Diagnosis present

## 2019-12-05 DIAGNOSIS — R296 Repeated falls: Secondary | ICD-10-CM | POA: Diagnosis present

## 2019-12-05 HISTORY — DX: Tobacco use: Z72.0

## 2019-12-05 HISTORY — DX: Alcohol abuse, uncomplicated: F10.10

## 2019-12-05 LAB — COMPREHENSIVE METABOLIC PANEL
ALT: 24 U/L (ref 0–44)
AST: 44 U/L — ABNORMAL HIGH (ref 15–41)
Albumin: 3.2 g/dL — ABNORMAL LOW (ref 3.5–5.0)
Alkaline Phosphatase: 83 U/L (ref 38–126)
Anion gap: 13 (ref 5–15)
BUN: 9 mg/dL (ref 8–23)
CO2: 25 mmol/L (ref 22–32)
Calcium: 8.8 mg/dL — ABNORMAL LOW (ref 8.9–10.3)
Chloride: 94 mmol/L — ABNORMAL LOW (ref 98–111)
Creatinine, Ser: 0.63 mg/dL (ref 0.61–1.24)
GFR calc Af Amer: >60 mL/min (ref >60)
GFR calc non Af Amer: >60 mL/min (ref >60)
Glucose, Bld: 126 mg/dL — ABNORMAL HIGH (ref 70–99)
Potassium: 3.3 mmol/L — ABNORMAL LOW (ref 3.5–5.1)
Sodium: 132 mmol/L — ABNORMAL LOW (ref 135–145)
Total Bilirubin: 1.8 mg/dL — ABNORMAL HIGH (ref 0.3–1.2)
Total Protein: 6.7 g/dL (ref 6.5–8.1)

## 2019-12-05 LAB — CBC WITH DIFFERENTIAL/PLATELET
Abs Immature Granulocytes: 0.04 10*3/uL (ref 0.00–0.07)
Basophils Absolute: 0 10*3/uL (ref 0.0–0.1)
Basophils Relative: 0 %
Eosinophils Absolute: 0 10*3/uL (ref 0.0–0.5)
Eosinophils Relative: 0 %
HCT: 42.2 % (ref 39.0–52.0)
Hemoglobin: 15 g/dL (ref 13.0–17.0)
Immature Granulocytes: 0 %
Lymphocytes Relative: 9 %
Lymphs Abs: 1 10*3/uL (ref 0.7–4.0)
MCH: 34.8 pg — ABNORMAL HIGH (ref 26.0–34.0)
MCHC: 35.5 g/dL (ref 30.0–36.0)
MCV: 97.9 fL (ref 80.0–100.0)
Monocytes Absolute: 0.8 10*3/uL (ref 0.1–1.0)
Monocytes Relative: 8 %
Neutro Abs: 8.8 10*3/uL — ABNORMAL HIGH (ref 1.7–7.7)
Neutrophils Relative %: 83 %
Platelets: 135 10*3/uL — ABNORMAL LOW (ref 150–400)
RBC: 4.31 MIL/uL (ref 4.22–5.81)
RDW: 13.2 % (ref 11.5–15.5)
WBC: 10.7 10*3/uL — ABNORMAL HIGH (ref 4.0–10.5)
nRBC: 0 % (ref 0.0–0.2)

## 2019-12-05 LAB — CK: Total CK: 33 U/L — ABNORMAL LOW (ref 49–397)

## 2019-12-05 LAB — MAGNESIUM: Magnesium: 1.9 mg/dL (ref 1.7–2.4)

## 2019-12-05 LAB — SARS CORONAVIRUS 2 BY RT PCR (HOSPITAL ORDER, PERFORMED IN ~~LOC~~ HOSPITAL LAB): SARS Coronavirus 2: NEGATIVE

## 2019-12-05 MED ORDER — THIAMINE HCL 100 MG PO TABS
100.0000 mg | ORAL_TABLET | Freq: Every day | ORAL | Status: DC
  Administered 2019-12-05 – 2019-12-06 (×2): 100 mg via ORAL
  Filled 2019-12-05 (×2): qty 1

## 2019-12-05 MED ORDER — LORAZEPAM 2 MG PO TABS
0.0000 mg | ORAL_TABLET | Freq: Four times a day (QID) | ORAL | Status: AC
  Administered 2019-12-05 – 2019-12-06 (×2): 2 mg via ORAL
  Filled 2019-12-05 (×2): qty 1

## 2019-12-05 MED ORDER — LORAZEPAM 2 MG/ML IJ SOLN: 0.0000 mg | Freq: Two times a day (BID) | INTRAMUSCULAR | Status: DC

## 2019-12-05 MED ORDER — LORAZEPAM 2 MG/ML IJ SOLN
0.0000 mg | Freq: Four times a day (QID) | INTRAMUSCULAR | Status: AC
  Administered 2019-12-06 – 2019-12-07 (×2): 2 mg via INTRAVENOUS
  Filled 2019-12-05 (×2): qty 1

## 2019-12-05 MED ORDER — LORAZEPAM 2 MG PO TABS: 0.0000 mg | ORAL_TABLET | Freq: Two times a day (BID) | ORAL | Status: AC

## 2019-12-05 MED ORDER — THIAMINE HCL 100 MG/ML IJ SOLN: 100.0000 mg | Freq: Every day | INTRAMUSCULAR | Status: DC

## 2019-12-05 MED ORDER — POTASSIUM CHLORIDE CRYS ER 20 MEQ PO TBCR
40.0000 meq | EXTENDED_RELEASE_TABLET | Freq: Once | ORAL | Status: AC
  Administered 2019-12-06: 40 meq via ORAL
  Filled 2019-12-05: qty 2

## 2019-12-05 NOTE — ED Provider Notes (Signed)
[facility] Emergency Department Provider Note  ____________________________________________   First MD Initiated Contact with Patient 12/05/19 1320     (approximate)  I have reviewed the triage vital signs and the nursing notes.   HISTORY  Chief Complaint Fall    HPI William [name] is a 64 y.o. male here with fall and generalized weakness.  The patient states that he is here because he feels generally weak.  He states that over the last several days, he has essentially been drinking only.  He states he does not have much of an appetite and has been essentially drinking alcohol only.  He states that over the last several days, he has felt weak, and has fallen multiple times due to this weakness.  He states he fell and landed on his right chest wall yesterday, and since then has had aching, throbbing, stabbing, right chest wall pain.  He said some mild cough.  No shortness of breath.  No fever.  He states he feels generally fatigued, and subsequent presents for evaluation.  He does not know if he had his head but has had some mild headache and paraspinal neck pain.  No upper extremity or lower extremity numbness or weakness.  No other complaints.  Nursing medication changes.  No abdominal pain, nausea, vomiting.        History reviewed. No pertinent past medical history.  There are no problems to display for this patient.   History reviewed. No pertinent surgical history.  Prior to Admission medications   Not on File    Allergies Patient has no known allergies.  No family history on file.  Social History Social History   Tobacco Use  . Smoking status: Current Every Day Smoker    Packs/day: 2.00    Types: Cigarettes  Substance Use Topics  . Alcohol use: Yes    Alcohol/week: 10.0 standard drinks    Types: 10 Cans of beer per week    Comment: everyday  . Drug use: Not on file    Review of Systems  Review of Systems  Constitutional: Positive  for appetite change and fatigue. Negative for chills and fever.  HENT: Negative for sore throat.   Respiratory: Positive for cough. Negative for shortness of breath.   Cardiovascular: Positive for chest pain.  Gastrointestinal: Positive for nausea. Negative for abdominal pain.  Genitourinary: Negative for flank pain.  Musculoskeletal: Negative for neck pain.  Skin: Negative for rash and wound.  Allergic/Immunologic: Negative for immunocompromised state.  Neurological: Positive for weakness. Negative for numbness.  Hematological: Does not bruise/bleed easily.  All other systems reviewed and are negative.    ____________________________________________  PHYSICAL EXAM:      VITAL SIGNS: ED Triage Vitals  Enc Vitals Group     BP 12/05/19 0928 95/63     Pulse Rate 12/05/19 0928 98     Resp 12/05/19 0928 20     Temp 12/05/19 0928 98.2 F (36.8 C)     Temp Source 12/05/19 0928 Oral     SpO2 12/05/19 0928 96 %     Weight 12/05/19 0929 175 lb (79.4 kg)     Height 12/05/19 0929 6\' 2"  (1.88 m)     Head Circumference --      Peak Flow --      Pain Score 12/05/19 1317 0     Pain Loc --      Pain Edu? --      Excl. in [place]? --  Physical Exam Vitals and nursing note reviewed.  Constitutional:      General: He is not in acute distress.    Appearance: He is well-developed.     Comments: Disheveled, appears older than stated age  HENT:     Head: Normocephalic and atraumatic.     Mouth/Throat:     Mouth: Mucous membranes are dry.  Eyes:     Conjunctiva/sclera: Conjunctivae normal.  Cardiovascular:     Rate and Rhythm: Normal rate and regular rhythm.     Heart sounds: Normal heart sounds. No murmur. No friction rub.  Pulmonary:     Effort: Pulmonary effort is normal. No respiratory distress.     Breath sounds: Normal breath sounds. No wheezing or rales.  Chest:     Comments: Tenderness to palpation over the right anterolateral chest wall without bruising or  deformity. Abdominal:     General: There is no distension.     Palpations: Abdomen is soft.     Tenderness: There is no abdominal tenderness.  Musculoskeletal:     Cervical back: Neck supple.     Comments: Scattered ecchymoses in various stages of healing  Skin:    General: Skin is warm.     Capillary Refill: Capillary refill takes less than 2 seconds.  Neurological:     Mental Status: He is alert and oriented to person, place, and time.     Motor: No abnormal muscle tone.       ____________________________________________   LABS (all labs ordered are listed, but only abnormal results are displayed)  Labs Reviewed  CBC WITH DIFFERENTIAL/PLATELET - Abnormal; Notable for the following components:      Result Value   WBC 10.7 (*)    MCH 34.8 (*)    Platelets 135 (*)    Neutro Abs 8.8 (*)    All other components within normal limits  COMPREHENSIVE METABOLIC PANEL - Abnormal; Notable for the following components:   Sodium 132 (*)    Potassium 3.3 (*)    Chloride 94 (*)    Glucose, Bld 126 (*)    Calcium 8.8 (*)    Albumin 3.2 (*)    AST 44 (*)    Total Bilirubin 1.8 (*)    All other components within normal limits  CK - Abnormal; Notable for the following components:   Total CK 33 (*)    All other components within normal limits  SARS CORONAVIRUS 2 BY RT PCR (HOSPITAL ORDER, PERFORMED IN [facility])  MAGNESIUM  URINALYSIS, COMPLETE (UACMP) WITH MICROSCOPIC    ____________________________________________  EKG: Sinus tachycardia, ventricular rate 100.  PR 146, QRS 102, QTc 513.  PVC noted.  Incomplete right bundle branch block.  Nonspecific ST changes, but no acute ST elevations to suggest acute ischemia or infarct. ________________________________________  RADIOLOGY All imaging, including plain films, CT scans, and ultrasounds, independently reviewed by me, and interpretations confirmed via formal radiology reads.  ED MD interpretation:   CT  Head/C-Spine: NAICA, negative C-Spine CXR: Clear, no rib fx or pulm contusions  Official radiology report(s): DG Chest 2 View  Result Date: 12/05/2019 CLINICAL DATA:  Fall, weakness EXAM: CHEST - 2 VIEW COMPARISON:  None. FINDINGS: Mild, likely chronic interstitial prominence related to background emphysema. Patchy atelectasis/scarring at the lung bases. No pleural effusion. No pneumothorax cardiomediastinal contours are within normal limits. No acute osseous abnormality. Chronic rib fractures IMPRESSION: No acute process in the chest Electronically Signed   By: Jackquline Berlin.D.  On: 12/05/2019 14:38   CT Head Wo Contrast  Result Date: 12/05/2019 CLINICAL DATA:  Fall EXAM: CT HEAD WITHOUT CONTRAST TECHNIQUE: Contiguous axial images were obtained from the base of the skull through the vertex without intravenous contrast. COMPARISON:  None. FINDINGS: Brain: There is no acute intracranial hemorrhage, mass effect, or edema. Gray-white differentiation is preserved. There is no extra-axial fluid collection. Prominence of the ventricles and sulci reflects generalized parenchymal volume loss. Patchy and confluent areas of hypoattenuation in the supratentorial white matter nonspecific but probably reflect moderate chronic microvascular ischemic changes. These findings are greater than expected for age. Vascular: No hyperdense vessel. There is mild atherosclerotic calcification at the skull base. Skull: Calvarium is unremarkable. Sinuses/Orbits: Patchy mucosal thickening.  Orbits are unremarkable. Other: None. IMPRESSION: No evidence of acute intracranial injury. Chronic/nonemergent findings detailed above. Electronically Signed   By: Guadlupe Spanish M.D.   On: 12/05/2019 14:29   CT Cervical Spine Wo Contrast  Result Date: 12/05/2019 CLINICAL DATA:  Fall EXAM: CT CERVICAL SPINE WITHOUT CONTRAST TECHNIQUE: Multidetector CT imaging of the cervical spine was performed without intravenous contrast. Multiplanar CT  image reconstructions were also generated. COMPARISON:  None. FINDINGS: Alignment: Anteroposterior alignment is maintained. Skull base and vertebrae: There is fusion across the C6-C7 disc space as well as both facets. There are large anterior osteophytes at C3-C6. The largest at C3 and C4 indent the pharynx. There is no acute fracture. Soft tissues and spinal canal: No prevertebral fluid or swelling. No visible canal hematoma. Disc levels: Multilevel degenerative changes are present without high-grade osseous encroachment on spinal canal. There is multilevel foraminal stenosis Upper chest: Emphysema. Other: None. IMPRESSION: No acute cervical spine fracture. Electronically Signed   By: Guadlupe Spanish M.D.   On: 12/05/2019 14:35    ____________________________________________  PROCEDURES   Procedure(s) performed (including Critical Care):  Procedures  ____________________________________________  INITIAL IMPRESSION / MDM / ASSESSMENT AND PLAN / ED COURSE  As part of my medical decision making, I reviewed the following data within the electronic MEDICAL RECORD NUMBER Nursing notes reviewed and incorporated, Old chart reviewed, Notes from prior ED visits, and [place] Controlled Substance Database       *Dehaven Sine was evaluated in Emergency Department on 12/05/2019 for the symptoms described in the history of present illness. He was evaluated in the context of the global COVID-19 pandemic, which necessitated consideration that the patient might be at risk for infection with the SARS-CoV-2 virus that causes COVID-19. Institutional protocols and algorithms that pertain to the evaluation of patients at risk for COVID-19 are in a state of rapid change based on information released by regulatory bodies including the CDC and federal and state organizations. These policies and algorithms were followed during the patient's care in the ED.  Some ED evaluations and interventions may be delayed as a result of limited  staffing during the pandemic.*     Medical Decision Making:  64 yo M here with recurrent falls, generalized weakness, and rib pain s/p fall. On exam, pt appears dehydrated, disheveled but with no apparent distress. Imaging, labs as above. CXR shows no rib fx, PNA, hemothorax or other complications. CT negative for bleed or injury. Labs show mild hypokalemia but are otherwise largely unremarkable. Following fluids and eating, patient continues to have difficulty ambulating independently and reportedly lives alone, with >5 falls in the last 2-3 days. He expresses concern about returning home and feels like he cannot care for himself. He also expresses interested in alcohol detox. Will  place on CIWA and consult SW to assist with home resources or placement if needed. PT ordered as well for evaluation.  ____________________________________________  FINAL CLINICAL IMPRESSION(S) / ED DIAGNOSES  Final diagnoses:  Multiple falls  Uncomplicated alcohol dependence (HCC)     MEDICATIONS GIVEN DURING THIS VISIT:  Medications  LORazepam (ATIVAN) injection 0-4 mg ( Intravenous See Alternative 12/05/19 2117)    Or  LORazepam (ATIVAN) tablet 0-4 mg (2 mg Oral Given 12/05/19 2117)  LORazepam (ATIVAN) injection 0-4 mg (has no administration in time range)    Or  LORazepam (ATIVAN) tablet 0-4 mg (has no administration in time range)  thiamine tablet 100 mg (100 mg Oral Given 12/05/19 2118)    Or  thiamine (B-1) injection 100 mg ( Intravenous See Alternative 12/05/19 2118)     ED Discharge Orders    None       Note:  This document was prepared using Dragon voice recognition software and may include unintentional dictation errors.   Shaune Pollack, MD 12/05/19 2249

## 2019-12-05 NOTE — ED Notes (Signed)
Pt alert and NAD at this time.

## 2019-12-05 NOTE — ED Notes (Signed)
Patient ambulated down hall and back with walker.  Steady gait.  When I talked about him going home, he says he really doesn't want to go home.  He lives by self --says some help from a neighbor.

## 2019-12-05 NOTE — ED Notes (Signed)
Patient on stretcher. Says he has pain on and off--with movement.  Right lower ribs.  He is disheveled with dark bruising over arms, black substance on bottoms of feet.  He is alert and oriented.

## 2019-12-05 NOTE — ED Triage Notes (Signed)
Patient presents to the ED with left side pain after a fall this morning.  Patient states he has been drinking a large amount of alcohol over the past 2-3 days and has had nothing to eat for three days.  Patient told EMS, this morning he had only had 1 beer.  Patient was able to get up after his fall and bring himself to a chair on his back porch to wait for EMS.  Patient appears uncomfortable.

## 2019-12-05 NOTE — ED Notes (Signed)
PT given snacks and drink

## 2019-12-05 NOTE — BH Assessment (Signed)
Contacted the [facility] to determine if they can accept pt for hospitalization or if they are on diversion. The lady who answered, it sounded like her name was Tameka, but the line was very soft, stated that they are currently on diversion and have no beds available.  [place]: [phone]

## 2019-12-05 NOTE — TOC Initial Note (Signed)
Transition of Care [facility]) - Initial/Assessment Note   Patient Details  Name: William [name] MRN: [id number] Date of Birth: 11/02/1955  Transition of Care [facility]) CM/SW Contact:    Sherie Don, LCSW Phone Number: 12/05/2019, 9:31 PM  Clinical Narrative: Patient is a 64 year old male who presented to the ED for recent falls and subsequent pain. TOC received consult for HH/DME needs. CSW spoke with patient to complete initial assessment. Patient reported he lives alone in a mobile home and uses his rolling walker to get around, but stated "I don't wanna go home right now." CSW discussed [facility] and patient was agreeable to referral. CSW reached out to [place] with [facility], who accepted the referral. Helene Kelp requested the clinicals be faxed over to the patient's PCP at the [facility] clinic. Patient was unable to remember his PCP's name. CSW attempted to get the fax information through the [facility] as the [facility] clinic was closed. CSW was informed the patient has not been seen for several years and no longer has a PCP and will need to get in touch with eligibility in order to be reconnected with a PCP.  CSW spoke with patient again and patient reported he has not been seen by a PCP in 3-4 years. CSW explained HH cannot be set up without a PCP and patient expressed safety concerns about returning home. CSW spoke with patient's RN, Arby Barrette, who informed CSW the patient has poor hygiene as he is unable to complete his ADLs adequately at home and is having difficulty with ambulation. CSW discussed SNF placement with EDP. EDP agreeable to requesting PT consult in order to determine if patient meets criteria for SNF placement.              Expected Discharge Plan: Skilled Nursing Facility Barriers to Discharge: Continued Medical Work up  Patient Goals and CMS Choice Patient states their goals for this hospitalization and ongoing recovery are:: Go to rehab to improve strength  Expected Discharge Plan and  Services Expected Discharge Plan: [place] Choice: [place] Living arrangements for the past 2 months: Mobile Home  Prior Living Arrangements/Services Living arrangements for the past 2 months: Mobile Home Lives with:: Self Patient language and need for interpreter reviewed:: Yes Do you feel safe going back to the place where you live?: No   Patient continues to have frequent falls and cannot provide personal care to adequately complete ADLs  Need for Family Participation in Patient Care: Yes (Comment) Care giver support system in place?: Yes (comment)(Karen Francoise Schaumann (sister) PH: [phone]) Current home services: DME(Rolling walker) Criminal Activity/Legal Involvement Pertinent to Current Situation/Hospitalization: No - Comment as needed  Permission Sought/Granted Permission sought to share information with : Case Manager, Customer service manager, Family Supports Permission granted to share information with : Yes, Verbal Permission Granted  Share Information with NAME: Denny Levy  Permission granted to share info w AGENCY: SNF placements  Permission granted to share info w Relationship: Sister  Permission granted to share info w Contact Information: [phone]  Emotional Assessment Appearance:: Appears older than stated age Attitude/Demeanor/Rapport: Engaged Affect (typically observed): Accepting Orientation: : Oriented to Self, Oriented to Place, Oriented to  Time, Oriented to Situation Alcohol / Substance Use: Tobacco Use, Alcohol Use Psych Involvement: No (comment)  Admission diagnosis:  fall There are no problems to display for this patient.  PCP:  Clinic, [place]:   [id number] [place], [place] [address] [place] [address] Phone: [phone] Fax: [phone]  Readmission Risk Interventions No flowsheet data found.

## 2019-12-06 ENCOUNTER — Encounter: Payer: Self-pay | Admitting: Internal Medicine

## 2019-12-06 DIAGNOSIS — R1310 Dysphagia, unspecified: Secondary | ICD-10-CM | POA: Diagnosis present

## 2019-12-06 DIAGNOSIS — F1010 Alcohol abuse, uncomplicated: Secondary | ICD-10-CM | POA: Diagnosis not present

## 2019-12-06 DIAGNOSIS — E512 Wernicke's encephalopathy: Principal | ICD-10-CM

## 2019-12-06 DIAGNOSIS — G9341 Metabolic encephalopathy: Secondary | ICD-10-CM | POA: Diagnosis present

## 2019-12-06 DIAGNOSIS — Z720 Tobacco use: Secondary | ICD-10-CM

## 2019-12-06 DIAGNOSIS — R4182 Altered mental status, unspecified: Secondary | ICD-10-CM | POA: Diagnosis present

## 2019-12-06 DIAGNOSIS — K5900 Constipation, unspecified: Secondary | ICD-10-CM | POA: Diagnosis present

## 2019-12-06 DIAGNOSIS — F17210 Nicotine dependence, cigarettes, uncomplicated: Secondary | ICD-10-CM | POA: Diagnosis present

## 2019-12-06 DIAGNOSIS — F1020 Alcohol dependence, uncomplicated: Secondary | ICD-10-CM | POA: Diagnosis present

## 2019-12-06 DIAGNOSIS — Z66 Do not resuscitate: Secondary | ICD-10-CM | POA: Diagnosis present

## 2019-12-06 DIAGNOSIS — K760 Fatty (change of) liver, not elsewhere classified: Secondary | ICD-10-CM | POA: Diagnosis present

## 2019-12-06 DIAGNOSIS — R296 Repeated falls: Secondary | ICD-10-CM | POA: Diagnosis present

## 2019-12-06 DIAGNOSIS — G92 Toxic encephalopathy: Secondary | ICD-10-CM | POA: Diagnosis present

## 2019-12-06 DIAGNOSIS — R062 Wheezing: Secondary | ICD-10-CM

## 2019-12-06 DIAGNOSIS — R531 Weakness: Secondary | ICD-10-CM | POA: Insufficient documentation

## 2019-12-06 DIAGNOSIS — J9601 Acute respiratory failure with hypoxia: Secondary | ICD-10-CM | POA: Diagnosis present

## 2019-12-06 DIAGNOSIS — J441 Chronic obstructive pulmonary disease with (acute) exacerbation: Secondary | ICD-10-CM | POA: Diagnosis present

## 2019-12-06 DIAGNOSIS — Z20822 Contact with and (suspected) exposure to covid-19: Secondary | ICD-10-CM | POA: Diagnosis present

## 2019-12-06 DIAGNOSIS — D6959 Other secondary thrombocytopenia: Secondary | ICD-10-CM | POA: Diagnosis present

## 2019-12-06 DIAGNOSIS — D696 Thrombocytopenia, unspecified: Secondary | ICD-10-CM | POA: Insufficient documentation

## 2019-12-06 DIAGNOSIS — E876 Hypokalemia: Secondary | ICD-10-CM | POA: Diagnosis present

## 2019-12-06 LAB — URINALYSIS, COMPLETE (UACMP) WITH MICROSCOPIC
Bacteria, UA: NONE SEEN
Bilirubin Urine: NEGATIVE
Glucose, UA: NEGATIVE mg/dL
Hgb urine dipstick: NEGATIVE
Ketones, ur: NEGATIVE mg/dL
Leukocytes,Ua: NEGATIVE
Nitrite: NEGATIVE
Protein, ur: NEGATIVE mg/dL
Specific Gravity, Urine: 1.021 (ref 1.005–1.030)
pH: 5 (ref 5.0–8.0)

## 2019-12-06 LAB — BASIC METABOLIC PANEL
Anion gap: 11 (ref 5–15)
BUN: 9 mg/dL (ref 8–23)
CO2: 26 mmol/L (ref 22–32)
Calcium: 8.7 mg/dL — ABNORMAL LOW (ref 8.9–10.3)
Chloride: 98 mmol/L (ref 98–111)
Creatinine, Ser: 0.53 mg/dL — ABNORMAL LOW (ref 0.61–1.24)
GFR calc Af Amer: >60 mL/min (ref >60)
GFR calc non Af Amer: >60 mL/min (ref >60)
Glucose, Bld: 115 mg/dL — ABNORMAL HIGH (ref 70–99)
Potassium: 3.5 mmol/L (ref 3.5–5.1)
Sodium: 135 mmol/L (ref 135–145)

## 2019-12-06 LAB — GLUCOSE, CAPILLARY: Glucose-Capillary: 169 mg/dL — ABNORMAL HIGH (ref 70–99)

## 2019-12-06 LAB — CBC WITH DIFFERENTIAL/PLATELET
Abs Immature Granulocytes: 0.02 10*3/uL (ref 0.00–0.07)
Basophils Absolute: 0 10*3/uL (ref 0.0–0.1)
Basophils Relative: 1 %
Eosinophils Absolute: 0.1 10*3/uL (ref 0.0–0.5)
Eosinophils Relative: 1 %
HCT: 43.2 % (ref 39.0–52.0)
Hemoglobin: 14.9 g/dL (ref 13.0–17.0)
Immature Granulocytes: 0 %
Lymphocytes Relative: 10 %
Lymphs Abs: 0.6 10*3/uL — ABNORMAL LOW (ref 0.7–4.0)
MCH: 34.2 pg — ABNORMAL HIGH (ref 26.0–34.0)
MCHC: 34.5 g/dL (ref 30.0–36.0)
MCV: 99.1 fL (ref 80.0–100.0)
Monocytes Absolute: 0.4 10*3/uL (ref 0.1–1.0)
Monocytes Relative: 7 %
Neutro Abs: 4.6 10*3/uL (ref 1.7–7.7)
Neutrophils Relative %: 81 %
Platelets: 132 10*3/uL — ABNORMAL LOW (ref 150–400)
RBC: 4.36 MIL/uL (ref 4.22–5.81)
RDW: 13.3 % (ref 11.5–15.5)
WBC: 5.8 10*3/uL (ref 4.0–10.5)
nRBC: 0 % (ref 0.0–0.2)

## 2019-12-06 LAB — VITAMIN B12: Vitamin B-12: 833 pg/mL (ref 180–914)

## 2019-12-06 LAB — FOLATE: Folate: 8.8 ng/mL (ref >5.9)

## 2019-12-06 MED ORDER — SODIUM CHLORIDE 0.9 % IV BOLUS
1000.0000 mL | Freq: Once | INTRAVENOUS | Status: AC
  Administered 2019-12-06: 1000 mL via INTRAVENOUS

## 2019-12-06 MED ORDER — THIAMINE HCL 100 MG/ML IJ SOLN: 500.0000 mg | Freq: Three times a day (TID) | INTRAVENOUS | Status: DC

## 2019-12-06 MED ORDER — NICOTINE 21 MG/24HR TD PT24
21.0000 mg | MEDICATED_PATCH | Freq: Once | TRANSDERMAL | Status: AC
  Administered 2019-12-06: 21 mg via TRANSDERMAL
  Filled 2019-12-06: qty 1

## 2019-12-06 MED ORDER — IPRATROPIUM-ALBUTEROL 0.5-2.5 (3) MG/3ML IN SOLN
3.0000 mL | Freq: Four times a day (QID) | RESPIRATORY_TRACT | Status: DC
  Administered 2019-12-06 – 2019-12-07 (×2): 3 mL via RESPIRATORY_TRACT
  Filled 2019-12-06 (×3): qty 3

## 2019-12-06 MED ORDER — ACETAMINOPHEN 650 MG RE SUPP: 650.0000 mg | Freq: Four times a day (QID) | RECTAL | Status: DC | PRN

## 2019-12-06 MED ORDER — THIAMINE HCL 100 MG/ML IJ SOLN
500.0000 mg | Freq: Three times a day (TID) | INTRAVENOUS | Status: DC
  Administered 2019-12-07 – 2019-12-10 (×11): 500 mg via INTRAVENOUS
  Filled 2019-12-06 (×13): qty 5

## 2019-12-06 MED ORDER — FOLIC ACID 5 MG/ML IJ SOLN
1.0000 mg | Freq: Every day | INTRAMUSCULAR | Status: DC
  Administered 2019-12-06 – 2019-12-10 (×4): 1 mg via INTRAVENOUS
  Filled 2019-12-06 (×6): qty 0.2

## 2019-12-06 MED ORDER — BUDESONIDE 0.5 MG/2ML IN SUSP
0.5000 mg | Freq: Two times a day (BID) | RESPIRATORY_TRACT | Status: DC
  Administered 2019-12-06 – 2019-12-18 (×22): 0.5 mg via RESPIRATORY_TRACT
  Filled 2019-12-06 (×24): qty 2

## 2019-12-06 MED ORDER — ONDANSETRON HCL 4 MG PO TABS: 4.0000 mg | ORAL_TABLET | Freq: Four times a day (QID) | ORAL | Status: DC | PRN

## 2019-12-06 MED ORDER — DOXEPIN HCL 25 MG PO CAPS
25.0000 mg | ORAL_CAPSULE | Freq: Every day | ORAL | Status: DC
  Administered 2019-12-06: 25 mg via ORAL
  Filled 2019-12-06: qty 1

## 2019-12-06 MED ORDER — THIAMINE HCL 100 MG/ML IJ SOLN: 100.0000 mg | Freq: Once | INTRAMUSCULAR | Status: DC

## 2019-12-06 MED ORDER — ACETAMINOPHEN 325 MG PO TABS: 650.0000 mg | ORAL_TABLET | Freq: Four times a day (QID) | ORAL | Status: DC | PRN

## 2019-12-06 MED ORDER — THIAMINE HCL 100 MG/ML IJ SOLN
500.0000 mg | INTRAVENOUS | Status: AC
  Administered 2019-12-06: 500 mg via INTRAVENOUS
  Filled 2019-12-06: qty 5

## 2019-12-06 MED ORDER — ONDANSETRON HCL 4 MG/2ML IJ SOLN: 4.0000 mg | Freq: Four times a day (QID) | INTRAMUSCULAR | Status: DC | PRN

## 2019-12-06 NOTE — TOC Progression Note (Signed)
Transition of Care [facility]) - Progression Note    Patient Details  Name: William [name] MRN: [id number] Date of Birth: 1956-02-03  [facility]) CM/SW Contact  Trenton Founds, RN Phone Number: 12/06/2019, 3:00 PM  Clinical Narrative:   RNCM completed PASSR information to obtain number and completed FL-2 for signature. Patient is open to looking into any facility that can offer him a bed. Information sent to all local skilled facilities. RNCM will follow.     Expected Discharge Plan: Skilled Nursing Facility Barriers to Discharge: Continued Medical Work up  Expected Discharge Plan and Services Expected Discharge Plan: Skilled Nursing Facility     Post Acute Care Choice: Skilled Nursing Facility Living arrangements for the past 2 months: Mobile Home                                       Social Determinants of Health (SDOH) Interventions    Readmission Risk Interventions No flowsheet data found.

## 2019-12-06 NOTE — ED Provider Notes (Signed)
Procedures     ----------------------------------------- 2:51 PM on 12/06/2019 -----------------------------------------  Patient ambulated to the bathroom, found to be ataxic.  He is confused and slowly responsive.  Worried about Warnicke's encephalopathy.  Discussed with the hospitalist for further evaluation.  Chest x-ray and CT scan of the head obtained were okay.  I am repeating some labs.  He was placed on 2 L nasal cannula due to borderline hypoxia with a oxygen saturation of 89% on room air.    Sharman Cheek, MD 12/06/19 1452

## 2019-12-06 NOTE — ED Notes (Signed)
RN went to assess pt. Pt states he thinks it is September and says he is in a room. Pt lights dimmed for comfort for pt to relax. Pt on cardiac, bp and pulse ox monitoring.

## 2019-12-06 NOTE — ED Notes (Signed)
Pt using urinal, noted shob on exertion with productive cough. Sats 89% on RA, pt denies any hx of lung disease, is an every day smoker. Pt denies any pain at this time, EDP made aware of VS.

## 2019-12-06 NOTE — ED Notes (Signed)
Pt taken to floor with ED tech.

## 2019-12-06 NOTE — ED Notes (Signed)
Report attempted. Placed on hold for 6 minutes. Will try again in several minutes

## 2019-12-06 NOTE — ED Notes (Signed)
Patient oxygen level has been sitting at 89-90% patient placed on 2L oxygen

## 2019-12-06 NOTE — ED Notes (Signed)
Patient is with physical therapy 

## 2019-12-06 NOTE — NC FL2 (Signed)
  [place] MEDICAID FL2 LEVEL OF CARE SCREENING TOOL     IDENTIFICATION  Patient Name: William [name] Birthdate: 1955/08/07 Sex: male Admission Date (Current Location): 12/05/2019  [place] and [place] Number:  Chiropodist and Address:  [facility], [address] [place], [place] [address]      Provider Number: [id number]  Attending Physician Name and Address:  Alford [place], MD  Relative Name and Phone Number:  [name] [phone]    Current Level of Care: Hospital Recommended Level of Care: Skilled Nursing Facility Prior Approval Number:    Date Approved/Denied:   PASRR Number: [id number]  Discharge Plan: SNF    Current Diagnoses: There are no problems to display for this patient.   Orientation RESPIRATION BLADDER Height & Weight     Self, Time, Situation, Place  O2 Continent Weight: 79.4 kg Height:  6\' 2"  (188 cm)  BEHAVIORAL SYMPTOMS/MOOD NEUROLOGICAL BOWEL NUTRITION STATUS      Continent Diet(Regular)  AMBULATORY STATUS COMMUNICATION OF NEEDS Skin   Limited Assist Verbally Normal                       Personal Care Assistance Level of Assistance  Bathing, Feeding, Dressing Bathing Assistance: Limited assistance Feeding assistance: Limited assistance Dressing Assistance: Limited assistance     Functional Limitations Info  Sight, Hearing, Speech Sight Info: Adequate Hearing Info: Adequate Speech Info: Adequate    SPECIAL CARE FACTORS FREQUENCY  PT (By licensed PT), OT (By licensed OT)                    Contractures Contractures Info: Not present    Additional Factors Info  Code Status, Allergies Code Status Info: Full Allergies Info: No known allergies           Current Medications (12/06/2019):  This is the current hospital active medication list Current Facility-Administered Medications  Medication Dose Route Frequency Provider Last Rate Last Admin  . folic acid injection 1 mg  1 mg  Intravenous Daily 12/08/2019, MD      . LORazepam (ATIVAN) injection 0-4 mg  0-4 mg Intravenous Q6H Sharman Cheek, MD   Stopped at 12/06/19 [phone]   Or  . LORazepam (ATIVAN) tablet 0-4 mg  0-4 mg Oral Q6H 1601, MD   2 mg at 12/06/19 0439  . [START ON 12/08/2019] LORazepam (ATIVAN) injection 0-4 mg  0-4 mg Intravenous Q12H 12/10/2019, MD       Or  . Shaune Pollack ON 12/08/2019] LORazepam (ATIVAN) tablet 0-4 mg  0-4 mg Oral Q12H 12/10/2019, MD      . nicotine (NICODERM CQ - dosed in mg/24 hours) patch 21 mg  21 mg Transdermal Once Shaune Pollack, MD   21 mg at 12/06/19 0432  . thiamine tablet 100 mg  100 mg Oral Daily 12/08/19, MD   100 mg at 12/06/19 1050   Or  . thiamine (B-1) injection 100 mg  100 mg Intravenous Daily 12/08/19, MD      . thiamine (B-1) injection 100 mg  100 mg Intravenous Once Shaune Pollack, MD       No current outpatient medications on file.     Discharge Medications: Please see discharge summary for a list of discharge medications.  Relevant Imaging Results:  Relevant Lab Results:   Additional Information SS# Sharman Cheek  [id number], RN

## 2019-12-06 NOTE — Evaluation (Signed)
Physical Therapy Evaluation Patient Details Name: William [name] MRN: [id number] DOB: 06-09-1956 Today's Date: 12/06/2019   History of Present Illness  Patient is a 64 y.o. male admitted with fall with rib pain and generalized weakness after recent falls. Patient self reports 10 falls within the past 6 months due to weakness.Patient feels he is unable to care for himself at home at this time.    Clinical Impression  Patient presents with impaired functional mobility, fatigue, generalized weakness, and deconditioning. Patient required moderate assistance for bed mobility and transfers. Sitting balance impaired with right and posterior lean. Sp02 90% at rest and 94-89% with activity on room air. Patient unable to ambulate more than 2 ft at this time due to fatigue and generalized weakness. Patient would benefit from continued PT services to address functional deficits. Given these functional mobility impairments and high risk for falls, patient lives at home alone and is unsafe to discharge home alone at this time. SNF is recommended.     Follow Up Recommendations SNF    Equipment Recommendations  3in1 (PT)    Recommendations for Other Services       Precautions / Restrictions Precautions Precautions: Fall Restrictions Weight Bearing Restrictions: No      Mobility  Bed Mobility Overal bed mobility: Needs Assistance Bed Mobility: Supine to Sit;Sit to Supine     Supine to sit: Mod assist;HOB elevated Sit to supine: Mod assist   General bed mobility comments: assistance for trunk support provided with bed mobility. verbal cues for sequending and technique. extra time required to complete tasks and patient fatigued with activity   Transfers Overall transfer level: Needs assistance Equipment used: Rolling walker (2 wheeled) Transfers: Sit to/from Stand Sit to Stand: Mod assist         General transfer comment: facilitation for forward weight shifting with transition from sit to  stand. verbal cues for technique. lifting and lowering assistance provided   Ambulation/Gait Ambulation/Gait assistance: Mod assist Gait Distance (Feet): 2 Feet Assistive device: Rolling walker (2 wheeled) Gait Pattern/deviations: Leaning posteriorly;Wide base of support Gait velocity: decreased    General Gait Details: patient unable to safely ambulate further due to poor activity tolerance and generalized weakness. patient required faciliation for weight shifting and verbal cues for foot placement to take steps along edge of bed.   Stairs            Wheelchair Mobility    Modified Rankin (Stroke Patients Only)       Balance Overall balance assessment: History of Falls;Needs assistance Sitting-balance support: Single extremity supported;Feet supported Sitting balance-Leahy Scale: Poor Sitting balance - Comments: patient with right and posterior lean in sitting position. maximal cues and minimal faciliation provided for midline to maintain midline posture  Postural control: Right lateral lean;Posterior lean Standing balance support: Bilateral upper extremity supported Standing balance-Leahy Scale: Poor Standing balance comment: patient relying on bed for posterior leg support. cues for forward weight shifting, upright posture. standing tolerance limited to ~ 2 minutes and patient fatigued with activity                              Pertinent Vitals/Pain Pain Assessment: 0-10 Pain Score: 2  Pain Location: right hip  Pain Descriptors / Indicators: Discomfort Pain Intervention(s): Monitored during session;Limited activity within patient's tolerance;Repositioned    Home Living Family/patient expects to be discharged to:: Private residence Living Arrangements: Alone   Type of Home: Mobile home Home Access: Stairs  to enter Entrance Stairs-Rails: Chemical engineer of Steps: 4 Home Layout: One level Home Equipment: Walker - 2 wheels      Prior  Function Level of Independence: Independent with assistive device(s)         Comments: with rolling walker      Hand Dominance        Extremity/Trunk Assessment   Upper Extremity Assessment Upper Extremity Assessment: Generalized weakness    Lower Extremity Assessment Lower Extremity Assessment: Generalized weakness;RLE deficits/detail;LLE deficits/detail RLE Deficits / Details: quad 4/5 BLE, hip flexor 3/5 RLE with guarding due to hip pain on right RLE Sensation: WNL    Cervical / Trunk Assessment Cervical / Trunk Assessment: Kyphotic(with right neck side bend at rest )  Communication   Communication: Other (comment)(difficult to understand at times )  Cognition Arousal/Alertness: Awake/alert;Lethargic Behavior During Therapy: Flat affect(delayed responses at time ) Overall Cognitive Status: Within Functional Limits for tasks assessed                                 General Comments: oriented to month, year, place, self, and situation       General Comments General comments (skin integrity, edema, etc.): patient with cough during session that he reports is chronic and unchanged from baseline.     Exercises General Exercises - Lower Extremity Ankle Circles/Pumps: AROM;15 reps;Supine;Both(with cues for technique )   Assessment/Plan    PT Assessment Patient needs continued PT services  PT Problem List Decreased strength;Decreased range of motion;Decreased activity tolerance;Decreased balance;Decreased mobility;Decreased safety awareness;Pain       PT Treatment Interventions DME instruction;Gait training;Functional mobility training;Stair training;Therapeutic activities;Therapeutic exercise;Balance training;Neuromuscular re-education;Patient/family education    PT Goals (Current goals can be found in the Care Plan section)  Acute Rehab PT Goals Patient Stated Goal: patient feels he is unable to manage at home and wants to go to rehab at discharge  PT  Goal Formulation: With patient Time For Goal Achievement: 12/20/19 Potential to Achieve Goals: Fair    Frequency Min 2X/week   Barriers to discharge Decreased caregiver support lives alone     Co-evaluation               AM-PAC PT "6 Clicks" Mobility  Outcome Measure Help needed turning from your back to your side while in a flat bed without using bedrails?: A Lot Help needed moving from lying on your back to sitting on the side of a flat bed without using bedrails?: A Lot Help needed moving to and from a bed to a chair (including a wheelchair)?: A Lot Help needed standing up from a chair using your arms (e.g., wheelchair or bedside chair)?: A Lot Help needed to walk in hospital room?: Total Help needed climbing 3-5 steps with a railing? : Total 6 Click Score: 10    End of Session Equipment Utilized During Treatment: Gait belt Activity Tolerance: Patient limited by fatigue Patient left: in bed;with nursing/sitter in room;with call bell/phone within reach Nurse Communication: Mobility status(discharge recommendations ) PT Visit Diagnosis: Unsteadiness on feet (R26.81);Repeated falls (R29.6);Muscle weakness (generalized) (M62.81);Difficulty in walking, not elsewhere classified (R26.2)    Time: 1610-9604 PT Time Calculation (min) (ACUTE ONLY): 23 min   Charges:   PT Evaluation $PT Eval Low Complexity: 1 Low PT Treatments $Therapeutic Activity: 8-22 mins        Minna Merritts, PT, MPT   Percell Locus 12/06/2019, 2:49 PM

## 2019-12-06 NOTE — ED Notes (Signed)
Patient asked writer to assist him to the bathroom, when patient sat up in his bed he had urinated and had a small amount of stool in his bed. Writer assisted him to use his walker and he attempted but was unable to stand with the walker, he appeared to be weak and his legs were wobbly. Writer offered a wheelchair and assisted patient to wheelchair and moved to bathroom. While in bathroom patient had a hard time trying to get fresh clothing on and cleaning himself, very weak, had a hard time holding his head up while on the commode. Writer informed MD.

## 2019-12-06 NOTE — H&P (Signed)
[facility] [place] NAME: William [name]    MR#:  [id number]  DATE OF BIRTH:  Jun 17, 1956  DATE OF ADMISSION:  12/05/2019  PRIMARY CARE PHYSICIAN: [facility]   REQUESTING/REFERRING PHYSICIAN: Dr Maisie Fus  CHIEF COMPLAINT:   Chief Complaint  Patient presents with  . Fall    HISTORY OF PRESENT ILLNESS:  William [name]  is a 64 y.o. male coming in with falls and weakness.  He states he has been drinking too much and smoking too much and not eating very well.  He normally walks with a walker.  Patient coughing and has shortness of breath.  Sister had him involuntarily committed couple weeks ago.  Patient refused to go to rehab at that time.  Family has noticed a decline in the patient over the past couple years  PAST MEDICAL HISTORY:   Past Medical History:  Diagnosis Date  . Alcohol abuse   . Tobacco abuse     PAST SURGICAL HISTORY:  History reviewed. No pertinent surgical history.  SOCIAL HISTORY:   Social History   Tobacco Use  . Smoking status: Current Every Day Smoker    Packs/day: 2.00    Types: Cigarettes  . Smokeless tobacco: Never Used  Substance Use Topics  . Alcohol use: Yes    Alcohol/week: 18.0 standard drinks    Types: 18 Cans of beer per week    Comment: everyday    FAMILY HISTORY:   Family History  Problem Relation Age of Onset  . CAD Father     DRUG ALLERGIES:  No Known Allergies  REVIEW OF SYSTEMS:  CONSTITUTIONAL: No fever, positive for chills.  Positive for fatigue and weakness.  EYES: No blurred or double vision.  EARS, NOSE, AND THROAT: No tinnitus or ear pain. No sore throat RESPIRATORY: Positive for cough and shortness of breath.  No wheezing or hemoptysis.  CARDIOVASCULAR: No chest pain, orthopnea, edema.  GASTROINTESTINAL: Some nausea.  No vomiting, diarrhea or abdominal pain. No blood in bowel movements GENITOURINARY: No dysuria, hematuria.  ENDOCRINE: No polyuria,  nocturia,  HEMATOLOGY: No anemia, easy bruising or bleeding SKIN: No rash or lesion. MUSCULOSKELETAL: No joint pain or arthritis.   NEUROLOGIC: No tingling, numbness, weakness.  PSYCHIATRY: No anxiety or depression.   MEDICATIONS AT HOME:   Prior to Admission medications   Not on File   Patient states he takes no medications  VITAL SIGNS:  Blood pressure (!) 121/94, pulse (!) 103, temperature 98 F (36.7 C), temperature source Oral, resp. rate 20, height 6\' 2"  (1.88 m), weight 79.4 kg, SpO2 92 %.  PHYSICAL EXAMINATION:  GENERAL:  64 y.o.-year-old patient lying in the bed with no acute distress.  EYES: Pupils equal, round, reactive to light and accommodation. No scleral icterus. Extraocular muscles intact.  HEENT: Head atraumatic, normocephalic. Oropharynx and nasopharynx clear.  NECK:  Supple, no jugular venous distention. No thyroid enlargement, no tenderness.  LUNGS: Decreased breath sounds bilaterally, no wheezing, rales,rhonchi or crepitation. No use of accessory muscles of respiration.  CARDIOVASCULAR: S1, S2 normal. No murmurs, rubs, or gallops.  ABDOMEN: Soft, nontender, nondistended. Bowel sounds present. No organomegaly or mass.  EXTREMITIES: No pedal edema, cyanosis, or clubbing.  NEUROLOGIC: Cranial nerves II through XII are intact. Muscle strength 5/5 in all extremities. Sensation intact. Gait not checked.  PSYCHIATRIC: The patient is alert and answers simple questions SKIN: No rash, lesion, or ulcer.   LABORATORY PANEL:   CBC Recent Labs  Lab 12/06/19  1444  WBC 5.8  HGB 14.9  HCT 43.2  PLT 132*   ------------------------------------------------------------------------------------------------------------------  Chemistries  Recent Labs  Lab 12/05/19 1346  NA 132*  K 3.3*  CL 94*  CO2 25  GLUCOSE 126*  BUN 9  CREATININE 0.63  CALCIUM 8.8*  MG 1.9  AST 44*  ALT 24  ALKPHOS 83  BILITOT 1.8*    ------------------------------------------------------------------------------------------------------------------    RADIOLOGY:  DG Chest 2 View  Result Date: 12/05/2019 CLINICAL DATA:  Fall, weakness EXAM: CHEST - 2 VIEW COMPARISON:  None. FINDINGS: Mild, likely chronic interstitial prominence related to background emphysema. Patchy atelectasis/scarring at the lung bases. No pleural effusion. No pneumothorax cardiomediastinal contours are within normal limits. No acute osseous abnormality. Chronic rib fractures IMPRESSION: No acute process in the chest Electronically Signed   By: Guadlupe Spanish M.D.   On: 12/05/2019 14:38   CT Head Wo Contrast  Result Date: 12/05/2019 CLINICAL DATA:  Fall EXAM: CT HEAD WITHOUT CONTRAST TECHNIQUE: Contiguous axial images were obtained from the base of the skull through the vertex without intravenous contrast. COMPARISON:  None. FINDINGS: Brain: There is no acute intracranial hemorrhage, mass effect, or edema. Gray-white differentiation is preserved. There is no extra-axial fluid collection. Prominence of the ventricles and sulci reflects generalized parenchymal volume loss. Patchy and confluent areas of hypoattenuation in the supratentorial white matter nonspecific but probably reflect moderate chronic microvascular ischemic changes. These findings are greater than expected for age. Vascular: No hyperdense vessel. There is mild atherosclerotic calcification at the skull base. Skull: Calvarium is unremarkable. Sinuses/Orbits: Patchy mucosal thickening.  Orbits are unremarkable. Other: None. IMPRESSION: No evidence of acute intracranial injury. Chronic/nonemergent findings detailed above. Electronically Signed   By: Guadlupe Spanish M.D.   On: 12/05/2019 14:29   CT Cervical Spine Wo Contrast  Result Date: 12/05/2019 CLINICAL DATA:  Fall EXAM: CT CERVICAL SPINE WITHOUT CONTRAST TECHNIQUE: Multidetector CT imaging of the cervical spine was performed without intravenous  contrast. Multiplanar CT image reconstructions were also generated. COMPARISON:  None. FINDINGS: Alignment: Anteroposterior alignment is maintained. Skull base and vertebrae: There is fusion across the C6-C7 disc space as well as both facets. There are large anterior osteophytes at C3-C6. The largest at C3 and C4 indent the pharynx. There is no acute fracture. Soft tissues and spinal canal: No prevertebral fluid or swelling. No visible canal hematoma. Disc levels: Multilevel degenerative changes are present without high-grade osseous encroachment on spinal canal. There is multilevel foraminal stenosis Upper chest: Emphysema. Other: None. IMPRESSION: No acute cervical spine fracture. Electronically Signed   By: Guadlupe Spanish M.D.   On: 12/05/2019 14:35    IMPRESSION AND PLAN:   1.  Acute metabolic encephalopathy which could be a Wernicke's encephalopathy we will give high-dose thiamine and physical therapy evaluation. 2.  Mild wheeze with tobacco abuse.  We will give nebulizer treatments and continue to monitor. 3.  Alcohol abuse.  Monitor closely for signs of withdrawal.  Patient high risk to go into withdrawal.  Alcohol withdrawal protocol. 4.  Thrombocytopenia likely secondary to alcohol abuse 5.  Weakness.  Physical therapy recommends rehab  All the records, laboratory and radiological data are reviewed and case discussed with ED provider. Management plans discussed with the patient, family and they are in agreement.  The patient requires inpatient hospitalization because usually takes a few days of high-dose IV thiamine to get improvement in the patient's mental status.  Patient also high risk to go through withdrawal.  CODE STATUS: DNR  TOTAL  TIME TAKING CARE OF THIS PATIENT: 50 minutes.    Alford Highland M.D on 12/06/2019 at 3:20 PM  Between 7am to 6pm - Pager - [phone]  After 6pm call admission pager [phone]  [facility]  CC: Primary care physician; [facility]

## 2019-12-07 DIAGNOSIS — J441 Chronic obstructive pulmonary disease with (acute) exacerbation: Secondary | ICD-10-CM

## 2019-12-07 LAB — BASIC METABOLIC PANEL
Anion gap: 7 (ref 5–15)
BUN: 7 mg/dL — ABNORMAL LOW (ref 8–23)
CO2: 29 mmol/L (ref 22–32)
Calcium: 8.4 mg/dL — ABNORMAL LOW (ref 8.9–10.3)
Chloride: 104 mmol/L (ref 98–111)
Creatinine, Ser: 0.6 mg/dL — ABNORMAL LOW (ref 0.61–1.24)
GFR calc Af Amer: >60 mL/min (ref >60)
GFR calc non Af Amer: >60 mL/min (ref >60)
Glucose, Bld: 92 mg/dL (ref 70–99)
Potassium: 3.8 mmol/L (ref 3.5–5.1)
Sodium: 140 mmol/L (ref 135–145)

## 2019-12-07 LAB — CBC
HCT: 39 % (ref 39.0–52.0)
Hemoglobin: 13 g/dL (ref 13.0–17.0)
MCH: 33.7 pg (ref 26.0–34.0)
MCHC: 33.3 g/dL (ref 30.0–36.0)
MCV: 101 fL — ABNORMAL HIGH (ref 80.0–100.0)
Platelets: 107 10*3/uL — ABNORMAL LOW (ref 150–400)
RBC: 3.86 MIL/uL — ABNORMAL LOW (ref 4.22–5.81)
RDW: 13.6 % (ref 11.5–15.5)
WBC: 4 10*3/uL (ref 4.0–10.5)
nRBC: 0 % (ref 0.0–0.2)

## 2019-12-07 LAB — HIV ANTIBODY (ROUTINE TESTING W REFLEX): HIV Screen 4th Generation wRfx: NONREACTIVE

## 2019-12-07 LAB — GLUCOSE, CAPILLARY: Glucose-Capillary: 135 mg/dL — ABNORMAL HIGH (ref 70–99)

## 2019-12-07 MED ORDER — SODIUM CHLORIDE 0.9 % IV SOLN
INTRAVENOUS | Status: DC | PRN
  Administered 2019-12-07 – 2019-12-09 (×2): 250 mL via INTRAVENOUS

## 2019-12-07 MED ORDER — LORAZEPAM 2 MG/ML IJ SOLN
0.0000 mg | Freq: Four times a day (QID) | INTRAMUSCULAR | Status: AC
  Administered 2019-12-07 – 2019-12-08 (×2): 2 mg via INTRAVENOUS
  Administered 2019-12-08: 1 mg via INTRAVENOUS
  Administered 2019-12-08 – 2019-12-09 (×2): 2 mg via INTRAVENOUS
  Filled 2019-12-07 (×5): qty 1

## 2019-12-07 MED ORDER — METHYLPREDNISOLONE SODIUM SUCC 40 MG IJ SOLR
40.0000 mg | Freq: Every day | INTRAMUSCULAR | Status: DC
  Administered 2019-12-07: 40 mg via INTRAVENOUS
  Filled 2019-12-07: qty 1

## 2019-12-07 MED ORDER — METHYLPREDNISOLONE SODIUM SUCC 40 MG IJ SOLR
40.0000 mg | Freq: Three times a day (TID) | INTRAMUSCULAR | Status: DC
  Administered 2019-12-07 – 2019-12-08 (×2): 40 mg via INTRAVENOUS
  Filled 2019-12-07 (×2): qty 1

## 2019-12-07 MED ORDER — LORAZEPAM 2 MG PO TABS: 0.0000 mg | ORAL_TABLET | Freq: Four times a day (QID) | ORAL | Status: AC

## 2019-12-07 MED ORDER — QUETIAPINE FUMARATE 25 MG PO TABS
25.0000 mg | ORAL_TABLET | Freq: Every day | ORAL | Status: DC
  Administered 2019-12-08 – 2019-12-17 (×9): 25 mg via ORAL
  Filled 2019-12-07 (×10): qty 1

## 2019-12-07 MED ORDER — IPRATROPIUM-ALBUTEROL 0.5-2.5 (3) MG/3ML IN SOLN
3.0000 mL | Freq: Three times a day (TID) | RESPIRATORY_TRACT | Status: DC
  Administered 2019-12-07 – 2019-12-15 (×20): 3 mL via RESPIRATORY_TRACT
  Filled 2019-12-07 (×23): qty 3

## 2019-12-07 MED ORDER — FUROSEMIDE 10 MG/ML IJ SOLN
INTRAMUSCULAR | Status: AC
  Filled 2019-12-07: qty 4

## 2019-12-07 MED ORDER — METHYLPREDNISOLONE SODIUM SUCC 125 MG IJ SOLR
INTRAMUSCULAR | Status: AC
  Administered 2019-12-07: 125 mg
  Filled 2019-12-07: qty 2

## 2019-12-07 MED ORDER — ALBUTEROL SULFATE (2.5 MG/3ML) 0.083% IN NEBU
2.5000 mg | INHALATION_SOLUTION | RESPIRATORY_TRACT | Status: DC | PRN
  Filled 2019-12-07: qty 3

## 2019-12-07 MED ORDER — FUROSEMIDE 10 MG/ML IJ SOLN
40.0000 mg | Freq: Once | INTRAMUSCULAR | Status: AC
  Administered 2019-12-07: 40 mg via INTRAVENOUS

## 2019-12-07 NOTE — Significant Event (Signed)
Rapid Response Follow Up Spoke with Tresa Endo RN in person on 2C. Patient appears much the same as earlier. Calm but increased respirations. Per her he did tolerate chest PT well and seemed to have a bit better respirations after.  Assessed patient in person. Currently patient's heart rate better in upper 90s (SR) and oxygen saturations 92% on room air. Respirations 22 and still shallow. Patient reports he just feels sleepy and is as oriented as previous assessment. Lungs still with rhonchi but slightly less than earlier. Cough still congested but no longer needs encouragement to give strong cough.

## 2019-12-07 NOTE — TOC Progression Note (Signed)
Transition of Care [facility]) - Progression Note    Patient Details  Name: William [name] MRN: [id number] Date of Birth: 04/27/1956  Transition [facility]) CM/SW Contact  Margarito Liner, LCSW Phone Number: 12/07/2019, 10:31 AM  Clinical Narrative: Faxed [facility] Screening Check[facility] to [facility] for [facility]F authorization.    Expected Discharge Plan: Skilled Nursing Facility Barriers to Discharge: Continued Medical Work up  Expected Discharge Plan and Services Expected Discharge Plan: Skilled Nursing Facility     Post Acute Care Choice: Skilled Nursing Facility Living arrangements for the past 2 months: Mobile Home                                       Social Determinants of Health (SDOH) Interventions    Readmission Risk Interventions No flowsheet data found.

## 2019-12-07 NOTE — Plan of Care (Signed)
Continuing with plan of care. 

## 2019-12-07 NOTE — Progress Notes (Signed)
MEWS guidelines initiated.  Dr. Renae Gloss ordered Lasix 40mg  IV once.

## 2019-12-07 NOTE — Progress Notes (Signed)
Attempted to deliver nebulizer treatment and chest PT via the Chest Vest. Patient repeatedly pulling mask off. Pulling at tubing. Patient refusing therapy at this time.

## 2019-12-07 NOTE — Plan of Care (Signed)
Patient scored 13 on the CIWA scale and he was given 2mg  of ativan and doxepin. Patient was combative and uncooperative. He was placed on a low bed with floor mats for safety and mittens to prevent pulling of lines. Patient is currently resting.  He is alert to self only. Will continue to monitor.  

## 2019-12-07 NOTE — Progress Notes (Signed)
Patient ID: William [name], male   DOB: Jun 30, 1956, 64 y.o.   MRN: [id number] [facility] PROGRESS NOTE  William [name] [id number]:[id number] DOB: June 27, 1956 DOA: 12/05/2019 PCP: [facility], [name]  HPI/Subjective: Patient was agitated overnight.  Did not sleep too much.  Still coughing and some wheeze.  Still feeling weak but agreeable to rehab at this time.  Objective: Vitals:   12/07/19 1329 12/07/19 1337  BP: 105/83   Pulse: (!) 116 (!) 112  Resp: (!) 42 (!) 48  Temp: 98.7 F (37.1 C)   SpO2:  90%    Intake/Output Summary (Last 24 hours) at 12/07/2019 1416 Last data filed at 12/07/2019 0900 Gross per 24 hour  Intake 1250 ml  Output 0 ml  Net 1250 ml   Filed Weights   12/05/19 0929 12/06/19 2301  Weight: 79.4 kg 86.6 kg    ROS: Review of Systems  Constitutional: Negative for fever.  Eyes: Negative for blurred vision.  Respiratory: Positive for cough, shortness of breath and wheezing.   Cardiovascular: Negative for chest pain.  Gastrointestinal: Negative for abdominal pain, nausea and vomiting.  Genitourinary: Negative for dysuria.  Musculoskeletal: Negative for joint pain.  Neurological: Negative for dizziness.   Exam: Physical Exam  HENT:  Nose: No mucosal edema.  Mouth/Throat: No oropharyngeal exudate or posterior oropharyngeal edema.  Eyes: Conjunctivae and lids are normal.  Neck: Carotid bruit is not present.  Cardiovascular: S1 normal and S2 normal. Exam reveals no gallop.  No murmur heard. Respiratory: No respiratory distress. He has decreased breath sounds in the right lower field and the left lower field. He has no wheezes. He has no rhonchi. He has no rales.  GI: Soft. Bowel sounds are normal. There is no abdominal tenderness.  Musculoskeletal:     Right ankle: No swelling.     Left ankle: No swelling.  Lymphadenopathy:    He has no cervical adenopathy.  Neurological: He is alert. No cranial nerve deficit.  Able to straight leg raise  Skin: Skin is  warm. No rash noted. Nails show no clubbing.  Psychiatric:  Able to follow commands this morning when I saw him      Data Reviewed: Basic Metabolic Panel: Recent Labs  Lab 12/05/19 1346 12/06/19 1444 12/07/19 0457  NA 132* 135 140  K 3.3* 3.5 3.8  CL 94* 98 104  CO2 25 26 29   GLUCOSE 126* 115* 92  BUN 9 9 7*  CREATININE 0.63 0.53* 0.60*  CALCIUM 8.8* 8.7* 8.4*  MG 1.9  --   --    Liver Function Tests: Recent Labs  Lab 12/05/19 1346  AST 44*  ALT 24  ALKPHOS 83  BILITOT 1.8*  PROT 6.7  ALBUMIN 3.2*   CBC: Recent Labs  Lab 12/05/19 1346 12/06/19 1444 12/07/19 0457  WBC 10.7* 5.8 4.0  NEUTROABS 8.8* 4.6  --   HGB 15.0 14.9 13.0  HCT 42.2 43.2 39.0  MCV 97.9 99.1 101.0*  PLT 135* 132* 107*   Cardiac Enzymes: Recent Labs  Lab 12/05/19 1346  CKTOTAL 33*    CBG: Recent Labs  Lab 12/06/19 1257 12/07/19 1343  GLUCAP 169* 135*    Recent Results (from the past 240 hour(s))  SARS Coronavirus 2 by RT PCR (hospital order, performed in [facility] hospital lab) Nasopharyngeal Nasopharyngeal Swab     Status: None   Collection Time: 12/05/19  6:35 PM   Specimen: Nasopharyngeal Swab  Result Value Ref Range Status   SARS Coronavirus 2 NEGATIVE NEGATIVE Final  Comment: (NOTE) SARS-CoV-2 target nucleic acids are NOT DETECTED. The SARS-CoV-2 RNA is generally detectable in upper and lower respiratory specimens during the acute phase of infection. The lowest concentration of SARS-CoV-2 viral copies this assay can detect is 250 copies / mL. A negative result does not preclude SARS-CoV-2 infection and should not be used as the sole basis for treatment or other patient management decisions.  A negative result may occur with improper specimen collection / handling, submission of specimen other than nasopharyngeal swab, presence of viral mutation(s) within the areas targeted by this assay, and inadequate number of viral copies (<250 copies / mL). A negative result  must be combined with clinical observations, patient history, and epidemiological information. Fact Sheet for Patients:   BoilerBrush.com.cy Fact Sheet for Healthcare Providers: https://pope.com/ This test is not yet approved or cleared  by the [place] FDA and has been authorized for detection and/or diagnosis of SARS-CoV-2 by FDA under an Emergency Use Authorization (EUA).  This EUA will remain in effect (meaning this test can be used) for the duration of the COVID-19 declaration under Section 564(b)(1) of the Act, 21 U.S.C. section 360bbb-3(b)(1), unless the authorization is terminated or revoked sooner. Performed at [facility], [address], [place], [place] [address]      Studies: No results found.  Scheduled Meds: . budesonide (PULMICORT) nebulizer solution  0.5 mg Nebulization BID  . folic acid  1 mg Intravenous Daily  . ipratropium-albuterol  3 mL Nebulization TID  . LORazepam  0-4 mg Intravenous Q6H   Or  . LORazepam  0-4 mg Oral Q6H  . [START ON 12/08/2019] LORazepam  0-4 mg Intravenous Q12H   Or  . [START ON 12/08/2019] LORazepam  0-4 mg Oral Q12H  . methylPREDNISolone (SOLU-MEDROL) injection  40 mg Intravenous Q8H  . QUEtiapine  25 mg Oral QHS   Continuous Infusions: . thiamine injection 500 mg (12/07/19 1326)    Assessment/Plan:  1. COPD exacerbation with tachypnea.  Solu-Medrol started this morning and will give every 8 for right now.  Continue nebulizer treatments. 2. Acute metabolic encephalopathy which could be Wernicke's encephalopathy.  Continue high-dose thiamine.  We will also give Seroquel at night. 3. Alcohol abuse.  Continue to monitor closely for signs of withdrawal.  Patient high risk to go into delirium tremens.  Alcohol withdrawal protocol. 4. Thrombocytopenia secondary to alcohol abuse 5. Weakness.  Physical therapy recommends rehab      Code Status:     Code Status Orders   (From admission, onward)         Start     Ordered   12/06/19 1519  Do not attempt resuscitation (DNR)  Continuous    Question Answer Comment  In the event of cardiac or respiratory ARREST Do not call a "code blue"   In the event of cardiac or respiratory ARREST Do not perform Intubation, CPR, defibrillation or ACLS   In the event of cardiac or respiratory ARREST Use medication by any route, position, wound care, and other measures to relive pain and suffering. May use oxygen, suction and manual treatment of airway obstruction as needed for comfort.   Comments nurse may pronounce      12/06/19 1519        Code Status History    Date Active Date Inactive Code Status Order ID Comments User Context   12/05/2019 1617 12/06/2019 1519 Full Code [id number]  Shaune Pollack, MD ED   11/15/2019 1454 11/17/2019 0858 Full Code [id number]  Lorre Nick, MD ED   Advance Care Planning Activity     Family Communication: Left message for patient's sister Disposition Plan: Status is: Inpatient  Dispo: The patient is from: Home              Anticipated d/c is to: Rehab              Anticipated d/c date is: Patient high risk to go through withdrawal while here in the hospital.  Likely will be here through the weekend              Patient currently being treated for COPD exacerbation.  Patient high risk for going through withdrawal.  Likely will be with [place] through the weekend.  Time spent: 29 minutes, case discussed with nursing staff  Alford Highland  [facility]

## 2019-12-07 NOTE — Progress Notes (Signed)
Pt unable to comprehend instructions for IS and Flutter. Equipment left in room. Chest Vest ordered TID

## 2019-12-07 NOTE — Progress Notes (Signed)
   12/07/19 1300  Clinical Encounter Type  Visited With Patient;Health care provider  Visit Type Initial  Referral From Chaplain  Consult/Referral To Chaplain  Chaplain responded to a page. When chaplain arrived she was told it was a RR. Staff were working on patient and chaplain said a silent prayer and left.

## 2019-12-07 NOTE — Significant Event (Signed)
Rapid Response Event Note  Overview: Paged: 13:40  Arrival: 13:40      Initial Focused Assessment: Rapid response RN arrived in patient's room with RT and Patient's RN at bedside with patient finishing a breathing treatment. Patient calm in bed with noted tachynpea and shallow breathing. Patient able to respond to voice and oriented to self and place and while able to state it was Friday said that it was September when it is May. Patient with known history of alcohol abuse and smoking two packs of cigarettes a day. Patient also had received lasix dose earlier in shift. Currently lungs with bilateral rhnochi, patient able to cough upon command but needed encouragement to produce strong cough. Cough congested. Patient's vitals at this time HR 117 ST, BP 92/63, RR 45 and shallow, oxygen saturations 93% on room air.   Interventions: Discuss patient's condition with Dr. Renae Gloss over the phone on speaker with patient's RN and RT. Current plan to increase steroid dose and start patient with incentive spirometer, flutter valve, and chest PT vest to try and help patient mobilize secretions.  Plan of Care (if not transferred): Current plan to remain on 1C and increase steroids and try to mobilize secretions with above interventions. Nurse instructed to recall rapid response if needed.  Event Summary:   at      at          [place]

## 2019-12-07 NOTE — Progress Notes (Signed)
   12/07/19 1202  Assess: MEWS Score  Temp 97.8 F (36.6 C)  BP (!) 141/78  Pulse Rate (!) 104  Resp (!) 38  Level of Consciousness Alert  SpO2 92 %  O2 Device Room Air  Patient Activity (if Appropriate) In bed  Assess: MEWS Score  MEWS Temp 0  MEWS Systolic 0  MEWS Pulse 1  MEWS RR 3  MEWS LOC 0  MEWS Score 4  MEWS Score Color Red  Assess: if the MEWS score is Yellow or Red  Were vital signs taken at a resting state? Yes  Focused Assessment Documented focused assessment  Early Detection of Sepsis Score *See Row Information* Low  MEWS guidelines implemented *See Row Information* Yes  Treat  MEWS Interventions Other (Comment) (Dr. Renae Gloss ordered Lasix 40mg  IV once)  Take Vital Signs  Increase Vital Sign Frequency  Red: Q 1hr X 4 then Q 4hr X 4, if remains red, continue Q 4hrs  Escalate  MEWS: Escalate Red: discuss with charge nurse/RN and provider, consider discussing with RRT  Notify: Charge Nurse/RN  Name of Charge Nurse/RN Notified , RN  Date Charge Nurse/RN Notified 12/07/19  Time Charge Nurse/RN Notified 1212  Notify: Provider  Provider Name/Title Dr. 12/09/19  Date Provider Notified 12/07/19  Time Provider Notified 1212  Notification Type Page  Notification Reason Change in status  Response See new orders  Date of Provider Response 12/07/19  Time of Provider Response 1215  Notify: Rapid Response  Name of Rapid Response RN Notified Sarah, ICU RN  Date Rapid Response Notified 12/07/19  Time Rapid Response Notified 1235  Document  Patient Outcome Other (Comment) (Monitoring after intervention)  Progress note created (see row info) Yes

## 2019-12-08 ENCOUNTER — Inpatient Hospital Stay: Payer: No Typology Code available for payment source

## 2019-12-08 DIAGNOSIS — J9601 Acute respiratory failure with hypoxia: Secondary | ICD-10-CM

## 2019-12-08 LAB — GLUCOSE, CAPILLARY: Glucose-Capillary: 176 mg/dL — ABNORMAL HIGH (ref 70–99)

## 2019-12-08 MED ORDER — METHYLPREDNISOLONE SODIUM SUCC 40 MG IJ SOLR
40.0000 mg | Freq: Two times a day (BID) | INTRAMUSCULAR | Status: DC
  Administered 2019-12-08 – 2019-12-10 (×4): 40 mg via INTRAVENOUS
  Filled 2019-12-08 (×4): qty 1

## 2019-12-08 NOTE — Progress Notes (Signed)
Responded to Rapid Response. Upon arrival, RN informed that patient's SpO2 dropped into the 70's. RN placed patient on 100% NRB. Once RT arrived, SpO2 was up to 100%. Patient is diminished with a loose cough, upper airway secretions. Patient with weak/moderate cough. SVN given since patient is calm at this time. O2 weaned back down to room air. Will continue to monitor.

## 2019-12-08 NOTE — Progress Notes (Addendum)
   12/08/19 0000  Clinical Encounter Type  Visited With Patient;Other (Comment)  Visit Type Initial  Referral From Nurse  Consult/Referral To Chaplain  Chaplain responded to a page and when she arrived to room discovered it was a RR. Staff was working on patient. Chaplain silently prayed and left shortly thereafter.

## 2019-12-08 NOTE — TOC Progression Note (Signed)
Transition of Care [facility]) - Progression Note    Patient Details  Name: William [name] MRN: [id number] Date of Birth: [birth year]  Transition [facility] CM/SW Contact  Eilleen Kempf, [place] Phone Number: 12/08/2019, 4:35 PM  Clinical Narrative:    5/29: CSW contacted Clydie Braun and shared info about [facility] and [facility] in [place]. Clydie Braun reports both sisters will visit tomorrow (Sunday) and let CSW know what they decide, most likely will prefer [facility].     Expected Discharge Plan: Skilled Nursing Facility Barriers to Discharge: Continued Medical Work up  Expected Discharge Plan and Services Expected Discharge Plan: Skilled Nursing Facility     Post Acute Care Choice: Skilled Nursing Facility Living arrangements for the past 2 months: Mobile Home                                       Social Determinants of Health (SDOH) Interventions    Readmission Risk Interventions No flowsheet data found.

## 2019-12-08 NOTE — Progress Notes (Signed)
Patient ID: William [name], male   DOB: 15-Dec-1955, 64 y.o.   MRN: [id number] [facility] PROGRESS NOTE  Ashrith Sagan [id number]:[id number] DOB: April 04, 1956 DOA: 12/05/2019 PCP: [facility], [facility]  HPI/Subjective: Patient awakened from sleep.  He asked me who I was.  I told him I have been seeing him every day from when I admitted him 2 days ago in the emergency room.  I introduced myself as Dr. Renae Gloss.  Patient has a little shortness of breath.  Objective: Vitals:   12/08/19 0531 12/08/19 1153  BP: 111/86 96/71  Pulse: 86 83  Resp: 20 (!) 32  Temp: 97.8 F (36.6 C) 97.7 F (36.5 C)  SpO2: 98% 98%    Intake/Output Summary (Last 24 hours) at 12/08/2019 1216 Last data filed at 12/08/2019 0404 Gross per 24 hour  Intake 170.1 ml  Output --  Net 170.1 ml   Filed Weights   12/05/19 0929 12/06/19 2301  Weight: 79.4 kg 86.6 kg    ROS: Review of Systems  Unable to perform ROS: Acuity of condition  Respiratory: Positive for shortness of breath.   Cardiovascular: Negative for chest pain.  Gastrointestinal: Negative for abdominal pain.   Exam: Physical Exam  HENT:  Nose: No mucosal edema.  Mouth/Throat: No oropharyngeal exudate or posterior oropharyngeal edema.  Eyes: Conjunctivae are normal.  Left eyelid ptosis  Neck: Carotid bruit is not present.  Cardiovascular: S1 normal and S2 normal. Exam reveals no gallop.  No murmur heard. Respiratory: No respiratory distress. He has decreased breath sounds in the right lower field and the left lower field. He has wheezes in the right lower field and the left lower field. He has no rhonchi. He has no rales.  GI: Soft. Bowel sounds are normal. There is no abdominal tenderness.  Musculoskeletal:     Right ankle: No swelling.     Left ankle: No swelling.  Lymphadenopathy:    He has no cervical adenopathy.  Neurological: He is alert. No cranial nerve deficit.  Able to straight leg raise  Skin: Skin is warm. No rash noted. Nails show no  clubbing.  Psychiatric:  Patient awakened from sleep and follows simple commands      Data Reviewed: Basic Metabolic Panel: Recent Labs  Lab 12/05/19 1346 12/06/19 1444 12/07/19 0457  NA 132* 135 140  K 3.3* 3.5 3.8  CL 94* 98 104  CO2 25 26 29   GLUCOSE 126* 115* 92  BUN 9 9 7*  CREATININE 0.63 0.53* 0.60*  CALCIUM 8.8* 8.7* 8.4*  MG 1.9  --   --    Liver Function Tests: Recent Labs  Lab 12/05/19 1346  AST 44*  ALT 24  ALKPHOS 83  BILITOT 1.8*  PROT 6.7  ALBUMIN 3.2*   CBC: Recent Labs  Lab 12/05/19 1346 12/06/19 1444 12/07/19 0457  WBC 10.7* 5.8 4.0  NEUTROABS 8.8* 4.6  --   HGB 15.0 14.9 13.0  HCT 42.2 43.2 39.0  MCV 97.9 99.1 101.0*  PLT 135* 132* 107*   Cardiac Enzymes: Recent Labs  Lab 12/05/19 1346  CKTOTAL 33*    CBG: Recent Labs  Lab 12/06/19 1257 12/07/19 1343 12/08/19 0023  GLUCAP 169* 135* 176*    Recent Results (from the past 240 hour(s))  SARS Coronavirus 2 by RT PCR (hospital order, performed in [facility] hospital lab) Nasopharyngeal Nasopharyngeal Swab     Status: None   Collection Time: 12/05/19  6:35 PM   Specimen: Nasopharyngeal Swab  Result Value Ref Range Status  SARS Coronavirus 2 NEGATIVE NEGATIVE Final    Comment: (NOTE) SARS-CoV-2 target nucleic acids are NOT DETECTED. The SARS-CoV-2 RNA is generally detectable in upper and lower respiratory specimens during the acute phase of infection. The lowest concentration of SARS-CoV-2 viral copies this assay can detect is 250 copies / mL. A negative result does not preclude SARS-CoV-2 infection and should not be used as the sole basis for treatment or other patient management decisions.  A negative result may occur with improper specimen collection / handling, submission of specimen other than nasopharyngeal swab, presence of viral mutation(s) within the areas targeted by this assay, and inadequate number of viral copies (<250 copies / mL). A negative result must be  combined with clinical observations, patient history, and epidemiological information. Fact Sheet for Patients:   StrictlyIdeas.no Fact Sheet for Healthcare Providers: BankingDealers.co.za This test is not yet approved or cleared  by the [place] FDA and has been authorized for detection and/or diagnosis of SARS-CoV-2 by FDA under an Emergency Use Authorization (EUA).  This EUA will remain in effect (meaning this test can be used) for the duration of the COVID-19 declaration under Section 564(b)(1) of the Act, 21 U.S.C. section 360bbb-3(b)(1), unless the authorization is terminated or revoked sooner. Performed at [facility], [address] [place], [place] [address]      Studies: DG Chest Regal 1 View  Result Date: 12/08/2019 CLINICAL DATA:  Respiratory distress EXAM: PORTABLE CHEST 1 VIEW COMPARISON:  12/05/2019 FINDINGS: The heart size and mediastinal contours are within normal limits. Both lungs are clear. The visualized skeletal structures are unremarkable. IMPRESSION: No active disease. Electronically Signed   By: Ulyses Jarred M.D.   On: 12/08/2019 01:09    Scheduled Meds: . budesonide (PULMICORT) nebulizer solution  0.5 mg Nebulization BID  . folic acid  1 mg Intravenous Daily  . ipratropium-albuterol  3 mL Nebulization TID  . LORazepam  0-4 mg Intravenous Q6H   Or  . LORazepam  0-4 mg Oral Q6H  . LORazepam  0-4 mg Oral Q12H  . methylPREDNISolone (SOLU-MEDROL) injection  40 mg Intravenous Q12H  . QUEtiapine  25 mg Oral QHS   Continuous Infusions: . sodium chloride Stopped (12/08/19 0110)  . thiamine injection 500 mg (12/08/19 0535)    Assessment/Plan:  1. Acute hypoxic respiratory failure with COPD exacerbation.  The patient required to go on oxygen yesterday with his pulse ox down at 83%.  Patient moving better air today.  Decrease dose of Solu-Medrol 40 mg every 12 hours. 2. Acute metabolic  encephalopathy which could be Wernicke's encephalopathy.  Continue high-dose thiamine.  Continue to give Seroquel at night. 3. Alcohol abuse.  Continue to monitor closely for signs of withdrawal.  Continue alcohol withdrawal protocol.  We will check an ultrasound of the right upper quadrant tomorrow. 4. Thrombocytopenia secondary to alcohol abuse 5. Weakness.  Physical therapy recommends rehab   Code Status:     Code Status Orders  (From admission, onward)         Start     Ordered   12/06/19 1519  Do not attempt resuscitation (DNR)  Continuous    Question Answer Comment  In the event of cardiac or respiratory ARREST Do not call a "code blue"   In the event of cardiac or respiratory ARREST Do not perform Intubation, CPR, defibrillation or ACLS   In the event of cardiac or respiratory ARREST Use medication by any route, position, wound care, and other measures to relive pain  and suffering. May use oxygen, suction and manual treatment of airway obstruction as needed for comfort.   Comments nurse may pronounce      12/06/19 1519        Code Status History    Date Active Date Inactive Code Status Order ID Comments User Context   12/05/2019 1617 12/06/2019 1519 Full Code [id number]  Shaune Pollack, MD ED   11/15/2019 1454 11/17/2019 0858 Full Code [id number]  Lorre Nick, MD ED   Advance Care Planning Activity     Family Communication: Left message for patient's sister Disposition Plan: Status is: Inpatient  Dispo: The patient is from: Home              Anticipated d/c is to: Rehab              Anticipated d/c date is: We will need rehab bed for this patient.  Earliest potential likely 12/11/2019              Patient currently being treated for COPD exacerbation.  Patient treated for alcohol withdrawal.  Time spent: 26 minutes.  Willona Phariss [employer]  [facility] [employer]

## 2019-12-08 NOTE — Progress Notes (Signed)
Rapid response called o2 sats 76% on r/a, 4l o2 applied sats remained in the 80's non rebreather placed sats 98%, resp in for treatment, Nsg supervisor I to see pt, Erica CCU nurse into see pt, Ouma NP notified, orders given

## 2019-12-08 NOTE — Plan of Care (Signed)
Continuing with plan of care. 

## 2019-12-08 NOTE — Progress Notes (Signed)
Patient ID: William [name], male   DOB: 05/24/1956, 64 y.o.   MRN: [id number]  Patient sister William [name] gave me a call back and I gave her an update on the patient's condition and what I am treating.  Dr Alford Highland

## 2019-12-08 NOTE — Progress Notes (Signed)
Spoke with NP Ouma @ 2115 and she stated she was OK with pt BP of 92/63 (73) as long as his MAP is >65  A.Arlys John

## 2019-12-09 DIAGNOSIS — K760 Fatty (change of) liver, not elsewhere classified: Secondary | ICD-10-CM

## 2019-12-09 NOTE — Progress Notes (Signed)
Patient ID: William [name], male   DOB: 1956/05/04, 64 y.o.   MRN: [id number] [facility] PROGRESS NOTE  [name]:[id number] DOB: 01/12/1956 DOA: 12/05/2019 PCP: [facility]  HPI/Subjective: Patient feeling okay today. Slept well last night. States they will feed him enough.  Objective: Vitals:   12/08/19 2043 12/09/19 0529  BP: 92/63 137/89  Pulse: 96 85  Resp: 20 (!) 22  Temp: (!) 97.5 F (36.4 C) 98.6 F (37 C)  SpO2: 98% 97%    Intake/Output Summary (Last 24 hours) at 12/09/2019 1117 Last data filed at 12/08/2019 1600 Gross per 24 hour  Intake 220.22 ml  Output --  Net 220.22 ml   Filed Weights   12/05/19 0929 12/06/19 2301  Weight: 79.4 kg 86.6 kg    ROS: Review of Systems  Constitutional: Negative for fever.  Eyes: Negative for blurred vision.  Respiratory: Positive for shortness of breath. Negative for cough.   Cardiovascular: Negative for chest pain.  Gastrointestinal: Negative for abdominal pain, constipation, diarrhea, nausea and vomiting.  Genitourinary: Negative for dysuria.  Musculoskeletal: Negative for joint pain.  Neurological: Negative for dizziness.   Exam: Physical Exam  HENT:  Nose: No mucosal edema.  Mouth/Throat: No oropharyngeal exudate or posterior oropharyngeal edema.  Eyes: Conjunctivae are normal.  Left eyelid ptosis  Neck: Carotid bruit is not present.  Cardiovascular: S1 normal and S2 normal. Exam reveals no gallop.  No murmur heard. Respiratory: No respiratory distress. He has decreased breath sounds in the right lower field and the left lower field. He has wheezes in the right lower field and the left lower field. He has no rhonchi. He has no rales.  GI: Soft. Bowel sounds are normal. There is no abdominal tenderness.  Musculoskeletal:     Right ankle: No swelling.     Left ankle: No swelling.  Lymphadenopathy:    He has no cervical adenopathy.  Neurological: He is alert. No cranial nerve deficit.  Able to  straight leg raise  Skin: Skin is warm. No rash noted. Nails show no clubbing.  Psychiatric:  Patient awakened from sleep and follows simple commands      Data Reviewed: Basic Metabolic Panel: Recent Labs  Lab 12/05/19 1346 12/06/19 1444 12/07/19 0457  NA 132* 135 140  K 3.3* 3.5 3.8  CL 94* 98 104  CO2 25 26 29   GLUCOSE 126* 115* 92  BUN 9 9 7*  CREATININE 0.63 0.53* 0.60*  CALCIUM 8.8* 8.7* 8.4*  MG 1.9  --   --    Liver Function Tests: Recent Labs  Lab 12/05/19 1346  AST 44*  ALT 24  ALKPHOS 83  BILITOT 1.8*  PROT 6.7  ALBUMIN 3.2*   CBC: Recent Labs  Lab 12/05/19 1346 12/06/19 1444 12/07/19 0457  WBC 10.7* 5.8 4.0  NEUTROABS 8.8* 4.6  --   HGB 15.0 14.9 13.0  HCT 42.2 43.2 39.0  MCV 97.9 99.1 101.0*  PLT 135* 132* 107*   Cardiac Enzymes: Recent Labs  Lab 12/05/19 1346  CKTOTAL 33*    CBG: Recent Labs  Lab 12/06/19 1257 12/07/19 1343 12/08/19 0023  GLUCAP 169* 135* 176*    Recent Results (from the past 240 hour(s))  SARS Coronavirus 2 by RT PCR (hospital order, performed in [facility] hospital lab) Nasopharyngeal Nasopharyngeal Swab     Status: None   Collection Time: 12/05/19  6:35 PM   Specimen: Nasopharyngeal Swab  Result Value Ref Range Status   SARS Coronavirus 2 NEGATIVE NEGATIVE  Final    Comment: (NOTE) SARS-CoV-2 target nucleic acids are NOT DETECTED. The SARS-CoV-2 RNA is generally detectable in upper and lower respiratory specimens during the acute phase of infection. The lowest concentration of SARS-CoV-2 viral copies this assay can detect is 250 copies / mL. A negative result does not preclude SARS-CoV-2 infection and should not be used as the sole basis for treatment or other patient management decisions.  A negative result may occur with improper specimen collection / handling, submission of specimen other than nasopharyngeal swab, presence of viral mutation(s) within the areas targeted by this assay, and inadequate  number of viral copies (<250 copies / mL). A negative result must be combined with clinical observations, patient history, and epidemiological information. Fact Sheet for Patients:   StrictlyIdeas.no Fact Sheet for Healthcare Providers: BankingDealers.co.za This test is not yet approved or cleared  by the [place] FDA and has been authorized for detection and/or diagnosis of SARS-CoV-2 by FDA under an Emergency Use Authorization (EUA).  This EUA will remain in effect (meaning this test can be used) for the duration of the COVID-19 declaration under Section 564(b)(1) of the Act, 21 U.S.C. section 360bbb-3(b)(1), unless the authorization is terminated or revoked sooner. Performed at [facility], [address] [place], [place] [address]      Studies: DG Chest [place] 1 View  Result Date: 12/08/2019 CLINICAL DATA:  Respiratory distress EXAM: PORTABLE CHEST 1 VIEW COMPARISON:  12/05/2019 FINDINGS: The heart size and mediastinal contours are within normal limits. Both lungs are clear. The visualized skeletal structures are unremarkable. IMPRESSION: No active disease. Electronically Signed   By: Ulyses Jarred M.D.   On: 12/08/2019 01:09   US Abdomen Limited RUQ  Result Date: 12/08/2019 CLINICAL DATA:  Alcohol abuse EXAM: ULTRASOUND ABDOMEN LIMITED RIGHT UPPER QUADRANT COMPARISON:  None. FINDINGS: Gallbladder: No gallstones or wall thickening visualized. No sonographic Murphy sign noted by sonographer. Common bile duct: Diameter: 3.1 mm Liver: Diffuse increased echogenicity throughout the liver without focal mass. Portal vein is patent on color Doppler imaging with normal direction of blood flow towards the liver. Other: None. IMPRESSION: 1. Diffuse increased echogenicity throughout the liver is nonspecific but often seen with hepatic steatosis. No other abnormalities. Electronically Signed   By: Dorise Bullion III M.D   On: 12/08/2019  14:29    Scheduled Meds: . budesonide (PULMICORT) nebulizer solution  0.5 mg Nebulization BID  . folic acid  1 mg Intravenous Daily  . ipratropium-albuterol  3 mL Nebulization TID  . LORazepam  0-4 mg Intravenous Q6H   Or  . LORazepam  0-4 mg Oral Q6H  . LORazepam  0-4 mg Oral Q12H  . methylPREDNISolone (SOLU-MEDROL) injection  40 mg Intravenous Q12H  . QUEtiapine  25 mg Oral QHS   Continuous Infusions: . sodium chloride 10 mL/hr at 12/08/19 1600  . thiamine injection 500 mg (12/09/19 1937)    Assessment/Plan:  1. Acute hypoxic respiratory failure with COPD exacerbation.  The patient required to go on oxygen yesterday with his pulse ox down at 83%. Check pulse ox on room air.. Continue dose of Solu-Medrol 40 mg every 12 hours. 2. Acute metabolic encephalopathy which could be Wernicke's encephalopathy.  Continue high-dose thiamine.  Continue to give Seroquel at night. Will likely switch over to oral thiamine tomorrow. 3. Alcohol abuse.  Continue alcohol withdrawal protocol. Right upper quadrant ultrasound shows hepatic steatosis 4. Thrombocytopenia secondary to alcohol abuse 5. Weakness.  Physical therapy recommends rehab   Code Status:  Code Status Orders  (From admission, onward)         Start     Ordered   12/06/19 1519  Do not attempt resuscitation (DNR)  Continuous    Question Answer Comment  In the event of cardiac or respiratory ARREST Do not call a "code blue"   In the event of cardiac or respiratory ARREST Do not perform Intubation, CPR, defibrillation or ACLS   In the event of cardiac or respiratory ARREST Use medication by any route, position, wound care, and other measures to relive pain and suffering. May use oxygen, suction and manual treatment of airway obstruction as needed for comfort.   Comments nurse may pronounce      12/06/19 1519        Code Status History    Date Active Date Inactive Code Status Order ID Comments User Context   12/05/2019 1617  12/06/2019 1519 Full Code [id number]  Shaune Pollack, MD ED   11/15/2019 1454 11/17/2019 0858 Full Code [id number]  Lorre Nick, MD ED   Advance Care Planning Activity     Family Communication: Spoke with patient's sister yesterday afternoon Disposition Plan: Status is: Inpatient  Dispo: The patient is from: Home              Anticipated d/c is to: Rehab              Anticipated d/c date is: We will need rehab bed for this patient.  Earliest potential likely 12/11/2019               Patient currently being treated for COPD exacerbation with IV steroids. Patient also on high-dose IV thiamine.  Time spent: 27 minutes.  Richie Vadala [employer]  [facility] [employer]

## 2019-12-09 NOTE — Progress Notes (Signed)
PT Cancellation Note  Patient Details Name: William [name] MRN: [id number] DOB: September 23, 1955   Cancelled Treatment:    Reason Eval/Treat Not Completed: Fatigue/lethargy limiting ability to participate , Patient refused PT today.  Ezekiel Ina , [place] DPT 12/09/2019, 11:38 AM

## 2019-12-09 NOTE — Plan of Care (Signed)
Continuing with plan of care. 

## 2019-12-10 DIAGNOSIS — E876 Hypokalemia: Secondary | ICD-10-CM

## 2019-12-10 LAB — BASIC METABOLIC PANEL
Anion gap: 11 (ref 5–15)
BUN: 13 mg/dL (ref 8–23)
CO2: 27 mmol/L (ref 22–32)
Calcium: 8.5 mg/dL — ABNORMAL LOW (ref 8.9–10.3)
Chloride: 105 mmol/L (ref 98–111)
Creatinine, Ser: 0.48 mg/dL — ABNORMAL LOW (ref 0.61–1.24)
GFR calc Af Amer: >60 mL/min (ref >60)
GFR calc non Af Amer: >60 mL/min (ref >60)
Glucose, Bld: 101 mg/dL — ABNORMAL HIGH (ref 70–99)
Potassium: 3.1 mmol/L — ABNORMAL LOW (ref 3.5–5.1)
Sodium: 143 mmol/L (ref 135–145)

## 2019-12-10 LAB — CBC
HCT: 42.4 % (ref 39.0–52.0)
Hemoglobin: 14.3 g/dL (ref 13.0–17.0)
MCH: 33.8 pg (ref 26.0–34.0)
MCHC: 33.7 g/dL (ref 30.0–36.0)
MCV: 100.2 fL — ABNORMAL HIGH (ref 80.0–100.0)
Platelets: 109 10*3/uL — ABNORMAL LOW (ref 150–400)
RBC: 4.23 MIL/uL (ref 4.22–5.81)
RDW: 13.4 % (ref 11.5–15.5)
WBC: 8.8 10*3/uL (ref 4.0–10.5)
nRBC: 0 % (ref 0.0–0.2)

## 2019-12-10 LAB — PHOSPHORUS: Phosphorus: 2.6 mg/dL (ref 2.5–4.6)

## 2019-12-10 LAB — MAGNESIUM: Magnesium: 2.2 mg/dL (ref 1.7–2.4)

## 2019-12-10 MED ORDER — AZITHROMYCIN 250 MG PO TABS
500.0000 mg | ORAL_TABLET | Freq: Every day | ORAL | Status: AC
  Administered 2019-12-10: 500 mg via ORAL
  Filled 2019-12-10: qty 2

## 2019-12-10 MED ORDER — METHYLPREDNISOLONE SODIUM SUCC 40 MG IJ SOLR
40.0000 mg | Freq: Every day | INTRAMUSCULAR | Status: DC
  Administered 2019-12-11 – 2019-12-12 (×2): 40 mg via INTRAVENOUS
  Filled 2019-12-10 (×2): qty 1

## 2019-12-10 MED ORDER — LABETALOL HCL 5 MG/ML IV SOLN
5.0000 mg | Freq: Once | INTRAVENOUS | Status: DC
  Filled 2019-12-10: qty 4

## 2019-12-10 MED ORDER — THIAMINE HCL 100 MG PO TABS
100.0000 mg | ORAL_TABLET | Freq: Every day | ORAL | Status: DC
  Filled 2019-12-10: qty 1

## 2019-12-10 MED ORDER — AZITHROMYCIN 250 MG PO TABS
250.0000 mg | ORAL_TABLET | Freq: Every day | ORAL | Status: AC
  Administered 2019-12-12 – 2019-12-14 (×3): 250 mg via ORAL
  Filled 2019-12-10 (×4): qty 1

## 2019-12-10 MED ORDER — FOLIC ACID 1 MG PO TABS
1.0000 mg | ORAL_TABLET | Freq: Every day | ORAL | Status: DC
  Administered 2019-12-12 – 2019-12-18 (×7): 1 mg via ORAL
  Filled 2019-12-10 (×8): qty 1

## 2019-12-10 MED ORDER — POTASSIUM CHLORIDE CRYS ER 20 MEQ PO TBCR
40.0000 meq | EXTENDED_RELEASE_TABLET | Freq: Once | ORAL | Status: AC
  Administered 2019-12-10: 40 meq via ORAL
  Filled 2019-12-10: qty 2

## 2019-12-10 MED ORDER — STARCH (THICKENING) PO POWD
ORAL | Status: DC | PRN
  Filled 2019-12-10: qty 227

## 2019-12-10 MED ORDER — DILTIAZEM HCL ER COATED BEADS 180 MG PO CP24
180.0000 mg | ORAL_CAPSULE | Freq: Every day | ORAL | Status: DC
  Administered 2019-12-10 – 2019-12-18 (×8): 180 mg via ORAL
  Filled 2019-12-10 (×10): qty 1

## 2019-12-10 NOTE — Progress Notes (Signed)
   12/10/19 1617  Assess: MEWS Score  Temp 98 F (36.7 C)  BP 100/71  Pulse Rate (!) 110  Resp (!) 37  SpO2 90 %  O2 Device Room Air  Assess: MEWS Score  MEWS Temp 0  MEWS Systolic 1  MEWS Pulse 1  MEWS RR 3  MEWS LOC 1  MEWS Score 6  MEWS Score Color Red  Assess: if the MEWS score is Yellow or Red  Were vital signs taken at a resting state? Yes  Focused Assessment Documented focused assessment  Early Detection of Sepsis Score *See Row Information* Low  MEWS guidelines implemented *See Row Information* Yes  Treat  MEWS Interventions Administered scheduled meds/treatments  Take Vital Signs  Increase Vital Sign Frequency  Red: Q 1hr X 4 then Q 4hr X 4, if remains red, continue Q 4hrs  Escalate  MEWS: Escalate Red: discuss with charge nurse/RN and provider, consider discussing with RRT  Notify: Charge Nurse/RN  Name of Charge Nurse/RN Notified Alcario Drought  Date Charge Nurse/RN Notified 12/10/19  Time Charge Nurse/RN Notified 1649  Notify: Provider  Provider Name/Title Wieting  Date Provider Notified 12/10/19  Time Provider Notified 1649  Notification Type Call  Notification Reason Change in status  Response No new orders  Document  Patient Outcome Not stable and remains on department  Progress note created (see row info) Yes

## 2019-12-10 NOTE — Progress Notes (Signed)
Mittens applied to pt's hands as he immediately tried to pull of oxygen when I applied it due to his low oxygen levels. Pt also trying to hit me despite my explaining why I have to put oxygen on him. Calling out to his wife. Of course patient is so weak he can't actually hit; the most he can do is lift his arm up.   Dr. Renae Gloss made aware.  When I checked on patient 5 minutes after application of mittens, he was resting quietly and comfortably in bed.

## 2019-12-10 NOTE — TOC Progression Note (Addendum)
Transition of Care [facility]) - Progression Note    Patient Details  Name: William [name] MRN: [id number] Date of Birth: [birth year]  [facility] CM/SW Contact  Liliana Cline, LCSW Phone Number: 12/10/2019, 10:51 AM  Clinical Narrative:   Per Weekend TOC notes, family prefers [facility] or [facility]. CSW called Gavin Pound with [facility] who reported they are not able to take any [facility] patients at this time.   Referral had not been sent to [facility] yet. CSW sent referral then called Admissions Worker Annice Pih who reported they cannot accept patient due to his behaviors.   Attempted to call patient's sister, Clydie Braun, with update. Left a voicemail requesting a return call.   Per chart review, it appears patient is followed by the [facility]. Will send referral to other SNFs contracted with [facility].   12:25- Return call from patient's sister. Provided update. She was agreeable to CSW sending patient's information to other [facility] SNFs and following up on bed availability.  1:20- Faxed patient's information to [facility] SNFs - [facility], [place], [facility], [address] [facility] [place], [facility], and [facility].      Expected Discharge Plan: Skilled Nursing Facility Barriers to Discharge: Continued Medical Work up  Expected Discharge Plan and Services Expected Discharge Plan: Skilled Nursing Facility     Post Acute Care Choice: Skilled Nursing Facility Living arrangements for the past 2 months: Mobile Home                                       Social Determinants of Health (SDOH) Interventions    Readmission Risk Interventions No flowsheet data found.

## 2019-12-10 NOTE — Progress Notes (Signed)
Patient ID: William [name], male   DOB: 13-Aug-1955, 64 y.o.   MRN: [id number] [facility] PROGRESS NOTE  William [name] [id number] DOB: [birth year] DOA: 12/05/2019 [facility]: [facility]  HPI/Subjective: Patient still has a cough.  Not eating very well.  Some shortness of breath.  Objective: Vitals:   12/10/19 1200 12/10/19 1204  BP: 95/82   Pulse: (!) 101 (!) 101  Resp: (!) 38 (!) 38  Temp: 97.7 F (36.5 C)   SpO2: 92%     Intake/Output Summary (Last 24 hours) at 12/10/2019 1242 Last data filed at 12/10/2019 0651 Gross per 24 hour  Intake 638.11 ml  Output --  Net 638.11 ml   Filed Weights   12/05/19 0929 12/06/19 2301  Weight: 79.4 kg 86.6 kg    ROS: Review of Systems  Unable to perform ROS: Acuity of condition  Respiratory: Positive for cough and shortness of breath.   Cardiovascular: Negative for chest pain.  Gastrointestinal: Negative for abdominal pain.   Exam: Physical Exam  HENT:  Nose: No mucosal edema.  Mouth/Throat: No oropharyngeal exudate or posterior oropharyngeal edema.  Eyes: Conjunctivae are normal.  Left eyelid ptosis  Neck: Carotid bruit is not present.  Cardiovascular: S1 normal and S2 normal. Tachycardia present. Exam reveals no gallop.  No murmur heard. Respiratory: No respiratory distress. He has decreased breath sounds in the right middle field, the right lower field and the left lower field. He has wheezes in the right middle field, the right lower field and the left lower field. He has no rhonchi. He has no rales.  GI: Soft. Bowel sounds are normal. There is no abdominal tenderness.  Musculoskeletal:     Right ankle: No swelling.     Left ankle: No swelling.  Lymphadenopathy:    He has no cervical adenopathy.  Neurological: He is alert.  Able to straight leg raise  Skin: Skin is warm. No rash noted. Nails show no clubbing.  Bruising on his arms  Psychiatric:  Patient follows some simple commands      Data Reviewed: Basic  Metabolic Panel: Recent Labs  Lab 12/05/19 1346 12/06/19 1444 12/07/19 0457 12/10/19 0514  NA 132* 135 140 143  K 3.3* 3.5 3.8 3.1*  CL 94* 98 104 105  CO2 25 26 29 27   GLUCOSE 126* 115* 92 101*  BUN 9 9 7* 13  CREATININE 0.63 0.53* 0.60* 0.48*  CALCIUM 8.8* 8.7* 8.4* 8.5*  MG 1.9  --   --  2.2  PHOS  --   --   --  2.6   Liver Function Tests: Recent Labs  Lab 12/05/19 1346  AST 44*  ALT 24  ALKPHOS 83  BILITOT 1.8*  PROT 6.7  ALBUMIN 3.2*   CBC: Recent Labs  Lab 12/05/19 1346 12/06/19 1444 12/07/19 0457 12/10/19 0514  WBC 10.7* 5.8 4.0 8.8  NEUTROABS 8.8* 4.6  --   --   HGB 15.0 14.9 13.0 14.3  HCT 42.2 43.2 39.0 42.4  MCV 97.9 99.1 101.0* 100.2*  PLT 135* 132* 107* 109*   Cardiac Enzymes: Recent Labs  Lab 12/05/19 1346  CKTOTAL 33*    CBG: Recent Labs  Lab 12/06/19 1257 12/07/19 1343 12/08/19 0023  GLUCAP 169* 135* 176*    Recent Results (from the past 240 hour(s))  SARS Coronavirus 2 by RT PCR (hospital order, performed in [facility] hospital lab) Nasopharyngeal Nasopharyngeal Swab     Status: None   Collection Time: 12/05/19  6:35 PM  Specimen: Nasopharyngeal Swab  Result Value Ref Range Status   SARS Coronavirus 2 NEGATIVE NEGATIVE Final    Comment: (NOTE) SARS-CoV-2 target nucleic acids are NOT DETECTED. The SARS-CoV-2 RNA is generally detectable in upper and lower respiratory specimens during the acute phase of infection. The lowest concentration of SARS-CoV-2 viral copies this assay can detect is 250 copies / mL. A negative result does not preclude SARS-CoV-2 infection and should not be used as the sole basis for treatment or other patient management decisions.  A negative result may occur with improper specimen collection / handling, submission of specimen other than nasopharyngeal swab, presence of viral mutation(s) within the areas targeted by this assay, and inadequate number of viral copies (<250 copies / mL). A negative result  must be combined with clinical observations, patient history, and epidemiological information. Fact Sheet for Patients:   BoilerBrush.com.cy Fact Sheet for Healthcare Providers: https://pope.com/ This test is not yet approved or cleared  by the [place] FDA and has been authorized for detection and/or diagnosis of SARS-CoV-2 by FDA under an Emergency Use Authorization (EUA).  This EUA will remain in effect (meaning this test can be used) for the duration of the COVID-19 declaration under Section 564(b)(1) of the Act, 21 U.S.C. section 360bbb-3(b)(1), unless the authorization is terminated or revoked sooner. Performed at [facility], [address] [place], [place] [address]      Studies: US Abdomen Limited RUQ  Result Date: 12/08/2019 CLINICAL DATA:  Alcohol abuse EXAM: ULTRASOUND ABDOMEN LIMITED RIGHT UPPER QUADRANT COMPARISON:  None. FINDINGS: Gallbladder: No gallstones or wall thickening visualized. No sonographic Murphy sign noted by sonographer. Common bile duct: Diameter: 3.1 mm Liver: Diffuse increased echogenicity throughout the liver without focal mass. Portal vein is patent on color Doppler imaging with normal direction of blood flow towards the liver. Other: None. IMPRESSION: 1. Diffuse increased echogenicity throughout the liver is nonspecific but often seen with hepatic steatosis. No other abnormalities. Electronically Signed   By: Gerome Sam III M.D   On: 12/08/2019 14:29    Scheduled Meds: . budesonide (PULMICORT) nebulizer solution  0.5 mg Nebulization BID  . diltiazem  180 mg Oral Daily  . folic acid  1 mg Intravenous Daily  . ipratropium-albuterol  3 mL Nebulization TID  . labetalol  5 mg Intravenous Once  . [START ON 12/11/2019] methylPREDNISolone (SOLU-MEDROL) injection  40 mg Intravenous Daily  . QUEtiapine  25 mg Oral QHS  . [START ON 12/11/2019] thiamine  100 mg Oral Daily   Continuous  Infusions: . sodium chloride 50 mL/hr at 12/10/19 0651    Assessment/Plan:  1. Acute hypoxic respiratory failure with COPD exacerbation.  The patient required to go on oxygen yesterday with his pulse ox down at 83%.  The patient had his oxygen off today and pulse ox was in the 90s so we did take him off oxygen.  Continue Solu-Medrol daily dosing.  The patient does take shallow rapid breaths at times.  Since I am still hearing noises in the lungs, I will get speech therapy evaluation to see if he is aspirating. 2. Acute metabolic encephalopathy which could be Wernicke's encephalopathy.  Patient was given high-dose thiamine without improvement for a few days.  We will switch over to oral thiamine for tomorrow..  Continue to give Seroquel at night. 3. Alcohol abuse.  Continue alcohol withdrawal protocol. Right upper quadrant ultrasound shows hepatic steatosis 4. Thrombocytopenia secondary to alcohol abuse 5. Weakness.  Physical therapy recommends rehab 6. Hypokalemia replace  potassium orally 7. Tachycardia.  Started on Cardizem CD today continue to monitor.   Code Status:     Code Status Orders  (From admission, onward)         Start     Ordered   12/06/19 1519  Do not attempt resuscitation (DNR)  Continuous    Question Answer Comment  In the event of cardiac or respiratory ARREST Do not call a "code blue"   In the event of cardiac or respiratory ARREST Do not perform Intubation, CPR, defibrillation or ACLS   In the event of cardiac or respiratory ARREST Use medication by any route, position, wound care, and other measures to relive pain and suffering. May use oxygen, suction and manual treatment of airway obstruction as needed for comfort.   Comments nurse may pronounce      12/06/19 1519        Code Status History    Date Active Date Inactive Code Status Order ID Comments User Context   12/05/2019 1617 12/06/2019 1519 Full Code [id number]  Duffy Bruce, MD ED   11/15/2019 1454  11/17/2019 0858 Full Code [id number]  Lacretia Leigh, MD ED   Advance Care Planning Activity     Family Communication: Left message for patient's sister on the phone Disposition Plan: Status is: Inpatient  Dispo: The patient is from: Home              Anticipated d/c is to: Rehab              Anticipated d/c date is: We will need rehab bed for this patient.  Earliest potential likely 12/11/2019               Patient currently being treated for COPD exacerbation with IV steroids.  We will also get speech therapy evaluation due to persistent wheeze heard in the lungs.  Time spent: 27 minutes.  [place]  [facility] [employer]

## 2019-12-10 NOTE — Progress Notes (Signed)
   12/10/19 1200  Assess: MEWS Score  Temp 97.7 F (36.5 C)  BP 95/82  Pulse Rate (!) 101  ECG Heart Rate (!) 101  Resp (!) 38  Level of Consciousness Responds to Voice  SpO2 92 %  O2 Device Room Air  Patient Activity (if Appropriate) In bed  Assess: MEWS Score  MEWS Temp 0  MEWS Systolic 1  MEWS Pulse 1  MEWS RR 3  MEWS LOC 1  MEWS Score 6  MEWS Score Color Red  Assess: if the MEWS score is Yellow or Red  Were vital signs taken at a resting state? Yes  Focused Assessment Documented focused assessment  Early Detection of Sepsis Score *See Row Information* Low  MEWS guidelines implemented *See Row Information* Yes  Treat  MEWS Interventions Administered scheduled meds/treatments  Take Vital Signs  Increase Vital Sign Frequency  Red: Q 1hr X 4 then Q 4hr X 4, if remains red, continue Q 4hrs  Escalate  MEWS: Escalate Red: discuss with charge nurse/RN and provider, consider discussing with RRT  Notify: Charge Nurse/RN  Name of Charge Nurse/RN Notified Alcario Drought  Date Charge Nurse/RN Notified 12/10/19  Time Charge Nurse/RN Notified 1216  Notify: Provider  Provider Name/Title Wieting  Date Provider Notified 12/10/19  Time Provider Notified 1217  Notification Type Call  Notification Reason Change in status  Document  Patient Outcome Other (Comment) (Monitoring)  Progress note created (see row info) Yes

## 2019-12-10 NOTE — Progress Notes (Signed)
   12/10/19 0858  Assess: MEWS Score  Temp 97.7 F (36.5 C)  BP 118/78  Pulse Rate (!) 108  Resp (!) 32  SpO2 94 %  O2 Device Nasal Cannula  O2 Flow Rate (L/min) 2 L/min  Assess: MEWS Score  MEWS Temp 0  MEWS Systolic 0  MEWS Pulse 1  MEWS RR 2  MEWS LOC 0  MEWS Score 3  MEWS Score Color Yellow  Assess: if the MEWS score is Yellow or Red  Were vital signs taken at a resting state? Yes  Focused Assessment Documented focused assessment  Early Detection of Sepsis Score *See Row Information* Low  MEWS guidelines implemented *See Row Information* Yes  Treat  MEWS Interventions Administered scheduled meds/treatments  Take Vital Signs  Increase Vital Sign Frequency  Yellow: Q 2hr X 2 then Q 4hr X 2, if remains yellow, continue Q 4hrs  Escalate  MEWS: Escalate Yellow: discuss with charge nurse/RN and consider discussing with provider and RRT  Notify: Provider  Provider Name/Title Wieting  Date Provider Notified 12/10/19  Time Provider Notified 0800  Notification Type Call  Notification Reason Other (Comment) (to make sure he's aware.)  Response See new orders  Date of Provider Response 12/10/19  Time of Provider Response [phone]  Document  Patient Outcome Other (Comment) (Will monitor)  Progress note created (see row info) Yes

## 2019-12-11 ENCOUNTER — Inpatient Hospital Stay: Payer: No Typology Code available for payment source

## 2019-12-11 ENCOUNTER — Encounter: Payer: Self-pay | Admitting: Internal Medicine

## 2019-12-11 DIAGNOSIS — R000 Tachycardia, unspecified: Secondary | ICD-10-CM

## 2019-12-11 MED ORDER — IOHEXOL 300 MG/ML  SOLN
75.0000 mL | Freq: Once | INTRAMUSCULAR | Status: AC | PRN
  Administered 2019-12-11: 75 mL via INTRAVENOUS

## 2019-12-11 MED ORDER — THIAMINE HCL 100 MG/ML IJ SOLN
100.0000 mg | Freq: Every day | INTRAMUSCULAR | Status: DC
  Administered 2019-12-12: 100 mg via INTRAVENOUS
  Filled 2019-12-11: qty 2

## 2019-12-11 MED ORDER — POTASSIUM CHLORIDE IN NACL 20-0.9 MEQ/L-% IV SOLN
INTRAVENOUS | Status: DC
  Filled 2019-12-11 (×2): qty 1000

## 2019-12-11 MED ORDER — HALOPERIDOL LACTATE 5 MG/ML IJ SOLN
1.0000 mg | Freq: Four times a day (QID) | INTRAMUSCULAR | Status: DC | PRN
  Administered 2019-12-11: 1 mg via INTRAVENOUS
  Filled 2019-12-11: qty 1

## 2019-12-11 MED ORDER — LORAZEPAM 2 MG/ML IJ SOLN
0.5000 mg | Freq: Once | INTRAMUSCULAR | Status: AC | PRN
  Administered 2019-12-12: 0.5 mg via INTRAVENOUS
  Filled 2019-12-11: qty 1

## 2019-12-11 NOTE — Progress Notes (Signed)
PT Cancellation Note  Patient Details Name: William [name] MRN: [id number] DOB: [birth year]   Cancelled Treatment:     PT attempt. Pt off floor for barium swallow study. PT will continue to follow per current POC.   Rushie Chestnut 12/11/2019, 1:34 PM

## 2019-12-11 NOTE — Progress Notes (Signed)
   12/11/19 1943  Vitals  Temp 97.6 F (36.4 C)  Temp Source Oral  BP (!) 87/62  MAP (mmHg) 71  BP Location Left Arm  BP Method Automatic  Patient Position (if appropriate) Lying  Pulse Rate 81  Pulse Rate Source Dinamap  Resp 20  Oxygen Therapy  SpO2 100 %  MEWS Score  MEWS Temp 0  MEWS Systolic 1  MEWS Pulse 0  MEWS RR 0  MEWS LOC 1  MEWS Score 2  MEWS Score Color Yellow   Webb Silversmith, NP Notified of current VS. Will continue to monitor patient and continue with the MEWS protocol.

## 2019-12-11 NOTE — Progress Notes (Addendum)
Patient ID: William [name], male   DOB: 1956-03-26, 64 y.o.   MRN: [id number]  Patient did not do well with the swallow eval.  They thought they saw a soft tissue neck mass on the modified barium swallow today.  We will get a CT soft tissues neck and ENT evaluation.  We will give IV fluids and keep n.p.o.  Dr Alford Highland

## 2019-12-11 NOTE — Progress Notes (Signed)
Patient ID: William [name], male   DOB: 1955/12/29, 65 y.o.   MRN: [id number] [facility] PROGRESS NOTE  Jerrik Housholder ATF:[id number] DOB: 1955-08-19 DOA: 12/05/2019 PCP: [facility]  HPI/Subjective: Patient had some agitation yesterday.  This morning still had the safety mittens on.  Does answer few questions.  Still having some cough and some shortness of breath.  He asked me why he was here in the hospital.  Objective: Vitals:   12/11/19 0037 12/11/19 0740  BP: 97/66   Pulse: 94 92  Resp:  20  Temp:    SpO2:  93%   No intake or output data in the 24 hours ending 12/11/19 1225 Filed Weights   12/05/19 0929 12/06/19 2301  Weight: 79.4 kg 86.6 kg    ROS: Review of Systems  Unable to perform ROS: Acuity of condition  Respiratory: Positive for cough and shortness of breath.   Cardiovascular: Negative for chest pain.  Gastrointestinal: Negative for abdominal pain.   Exam: Physical Exam  HENT:  Nose: No mucosal edema.  Mouth/Throat: No oropharyngeal exudate or posterior oropharyngeal edema.  Eyes: Conjunctivae are normal.  Left eyelid ptosis  Neck: Carotid bruit is not present.  Cardiovascular: S1 normal and S2 normal. Exam reveals no gallop.  No murmur heard. Respiratory: No respiratory distress. He has decreased breath sounds in the right middle field, the right lower field, the left middle field and the left lower field. He has no wheezes. He has no rhonchi. He has no rales.  GI: Soft. Bowel sounds are normal. There is no abdominal tenderness.  Musculoskeletal:     Right ankle: No swelling.     Left ankle: No swelling.  Lymphadenopathy:    He has no cervical adenopathy.  Neurological: He is alert.  Able to straight leg raise right leg easier than the left leg  Skin: Skin is warm. Nails show no clubbing.  Bruising on his arms  Psychiatric:  Patient follows some simple commands      Data Reviewed: Basic Metabolic Panel: Recent Labs  Lab  12/05/19 1346 12/06/19 1444 12/07/19 0457 12/10/19 0514  NA 132* 135 140 143  K 3.3* 3.5 3.8 3.1*  CL 94* 98 104 105  CO2 25 26 29 27   GLUCOSE 126* 115* 92 101*  BUN 9 9 7* 13  CREATININE 0.63 0.53* 0.60* 0.48*  CALCIUM 8.8* 8.7* 8.4* 8.5*  MG 1.9  --   --  2.2  PHOS  --   --   --  2.6   Liver Function Tests: Recent Labs  Lab 12/05/19 1346  AST 44*  ALT 24  ALKPHOS 83  BILITOT 1.8*  PROT 6.7  ALBUMIN 3.2*   CBC: Recent Labs  Lab 12/05/19 1346 12/06/19 1444 12/07/19 0457 12/10/19 0514  WBC 10.7* 5.8 4.0 8.8  NEUTROABS 8.8* 4.6  --   --   HGB 15.0 14.9 13.0 14.3  HCT 42.2 43.2 39.0 42.4  MCV 97.9 99.1 101.0* 100.2*  PLT 135* 132* 107* 109*   Cardiac Enzymes: Recent Labs  Lab 12/05/19 1346  CKTOTAL 33*    CBG: Recent Labs  Lab 12/06/19 1257 12/07/19 1343 12/08/19 0023  GLUCAP 169* 135* 176*    Recent Results (from the past 240 hour(s))  SARS Coronavirus 2 by RT PCR (hospital order, performed in [facility] hospital lab) Nasopharyngeal Nasopharyngeal Swab     Status: None   Collection Time: 12/05/19  6:35 PM   Specimen: Nasopharyngeal Swab  Result Value Ref Range Status  SARS Coronavirus 2 NEGATIVE NEGATIVE Final    Comment: (NOTE) SARS-CoV-2 target nucleic acids are NOT DETECTED. The SARS-CoV-2 RNA is generally detectable in upper and lower respiratory specimens during the acute phase of infection. The lowest concentration of SARS-CoV-2 viral copies this assay can detect is 250 copies / mL. A negative result does not preclude SARS-CoV-2 infection and should not be used as the sole basis for treatment or other patient management decisions.  A negative result may occur with improper specimen collection / handling, submission of specimen other than nasopharyngeal swab, presence of viral mutation(s) within the areas targeted by this assay, and inadequate number of viral copies (<250 copies / mL). A negative result must be combined with  clinical observations, patient history, and epidemiological information. Fact Sheet for Patients:   BoilerBrush.com.cy Fact Sheet for Healthcare Providers: https://pope.com/ This test is not yet approved or cleared  by the [place] FDA and has been authorized for detection and/or diagnosis of SARS-CoV-2 by FDA under an Emergency Use Authorization (EUA).  This EUA will remain in effect (meaning this test can be used) for the duration of the COVID-19 declaration under Section 564(b)(1) of the Act, 21 U.S.C. section 360bbb-3(b)(1), unless the authorization is terminated or revoked sooner. Performed at [facility], [address], [place], [place] [address]      Studies: No results found.  Scheduled Meds: . azithromycin  250 mg Oral Daily  . budesonide (PULMICORT) nebulizer solution  0.5 mg Nebulization BID  . diltiazem  180 mg Oral Daily  . folic acid  1 mg Oral Daily  . ipratropium-albuterol  3 mL Nebulization TID  . methylPREDNISolone (SOLU-MEDROL) injection  40 mg Intravenous Daily  . QUEtiapine  25 mg Oral QHS  . thiamine  100 mg Oral Daily   Continuous Infusions: . sodium chloride 50 mL/hr at 12/10/19 0651  . 0.9 % NaCl with KCl 20 mEq / L      Assessment/Plan:  1. Acute hypoxic respiratory failure with COPD exacerbation.  The patient intermittently having to go back on oxygen secondary to hypoxia.  Since the patient took a long time to clear with his respirations and deep cough I did get speech therapy to see him.  They will do a modified barium swallow to see what type of diet is safe for him. 2. Acute metabolic encephalopathy which could be Wernicke's encephalopathy.  Patient was given high-dose thiamine without improvement for a few days.  Now on oral thiamine.  Continue to give Seroquel at night. As needed Haldol.  We will get an MRI of the brain for further evaluation. 3. Alcohol abuse.  Finished alcohol  withdrawal protocol right upper quadrant ultrasound shows hepatic steatosis 4. Thrombocytopenia secondary to alcohol abuse 5. Weakness.  Physical therapy recommends rehab 6. Hypokalemia replace potassium and IV fluid 7. Tachycardia.  Started on Cardizem CD   Code Status:     Code Status Orders  (From admission, onward)         Start     Ordered   12/06/19 1519  Do not attempt resuscitation (DNR)  Continuous    Question Answer Comment  In the event of cardiac or respiratory ARREST Do not call a "code blue"   In the event of cardiac or respiratory ARREST Do not perform Intubation, CPR, defibrillation or ACLS   In the event of cardiac or respiratory ARREST Use medication by any route, position, wound care, and other measures to relive pain and suffering. May use oxygen,  suction and manual treatment of airway obstruction as needed for comfort.   Comments nurse may pronounce      12/06/19 1519        Code Status History    Date Active Date Inactive Code Status Order ID Comments User Context   12/05/2019 1617 12/06/2019 1519 Full Code [id number]  Shaune Pollack, MD ED   11/15/2019 1454 11/17/2019 0858 Full Code [id number]  Lorre Nick, MD ED   Advance Care Planning Activity     Family Communication: Left message for patient's sister on the phone today.  Did speak with her yesterday afternoon. Disposition Plan: Status is: Inpatient  Dispo: The patient is from: Home              Anticipated d/c is to: Rehab              Anticipated d/c date is: We will have to have a rehab bed for him prior to any disposition.  He will have to be less agitated prior to disposition.              Patient currently being treated for COPD exacerbation with IV steroids.  Speech therapy to do a modified barium swallow today to see what type of diet would be safe for him.  Since he has trouble swallowing and altered mental status I will also try to get an MRI of the brain.  Time spent: 27 minutes.  Kyilee Gregg  [employer]  [facility] [employer]

## 2019-12-11 NOTE — Progress Notes (Signed)
Mild RUE CT contrast extravasation..Soft tissue and skin intact .Pulses intact.Recomend observation.Orders written.[employer]

## 2019-12-11 NOTE — Progress Notes (Signed)
Modified Barium Swallow Progress Note  Patient Details  Name: William [name] MRN: [id number] Date of Birth: [birth year]  Today's Date: 12/11/2019  Modified Barium Swallow completed.  Full report located under Chart Review in the Imaging Section.  Brief recommendations include the following:  Clinical Impression  Pt is at severe risk of aspiration with any form of PO intake. Pt has many oropharyngeal deficits that result in inefficient oral phase, delayed swallow and pharyngeal residue with puree, thin liquids and nectar thick liquids. Despite these risk factors, pt has bulging of the posterior pharyngeal wall that impedes full epiglottic closure, hampers the full amplitude and duration of UES opening which allows only ~ 1/10 of bolus to pass into esophagus. In addition the posterior protrusion causes dumping of the bolus into the laryngeal vestibule with penetration and eventual aspiration across all trials.  During this study, when under fluro, it is difficult to visualize nature of the protrusion (soft-tissue or bone) therefore ENT consult requested to further differentiate nature of structure. At this time, pt is safest to remain NPO with no form of PO intake safe from an oropharyngeal standpoint. If it aligns with pt's GOC, would considered alternative means of nutrition at this in the short term.    Swallow Evaluation Recommendations   Recommended Consults: Consider ENT evaluation for differential dx of posterior pharyngeal wall protrusion   SLP Diet Recommendations: NPO       Medication Administration: Via alternative means               Oral Care Recommendations: Oral care QID      Saintclair Schroader B. Dreama Saa M.S., CCC-SLP, [facility] Speech-Language Pathologist Rehabilitation Services Office [phone]   Thedford Bunton Dreama Saa 12/11/2019,2:49 PM

## 2019-12-11 NOTE — TOC Progression Note (Addendum)
Transition of Care [facility]) - Progression Note    Patient Details  Name: William [name] MRN: [id number] Date of Birth: 09-Aug-1955  [facility] of [facility] CM/SW Contact  Margarito Liner, LCSW Phone Number: 12/11/2019, 10:05 AM  Clinical Narrative: [facility] in [place] unable to offer a bed.    12:25 pm: Discussed case with MD this morning. Patient has to be non-combative for 24-48 hours before a SNF will agree to take him. CSW left voicemails at [facility] and [facility] to check status of referral decision. [facility] did not receive referral that was faxed over. CSW faxed again. Admissions coordinator did say they will likely not have [facility] bed for a [facility] but will review referral to determine if they can meet his needs or not. [facility] declined referral on the hub.  Expected Discharge Plan: Skilled Nursing Facility Barriers to Discharge: Continued Medical Work up  Expected Discharge Plan and Services Expected Discharge Plan: Skilled Nursing Facility     Post Acute Care Choice: Skilled Nursing Facility Living arrangements for the past 2 months: Mobile Home                                       Social Determinants of Health (SDOH) Interventions    Readmission Risk Interventions No flowsheet data found.

## 2019-12-11 NOTE — Evaluation (Signed)
Clinical/Bedside Swallow Evaluation Patient Details  Name: William [name] MRN: [id number] Date of Birth: 10-13-1955  Today's Date: 12/11/2019 Time: SLP Start Time (ACUTE ONLY): 6283 SLP Stop Time (ACUTE ONLY): 0959 SLP Time Calculation (min) (ACUTE ONLY): 54 min  Past Medical History:  Past Medical History:  Diagnosis Date  . Alcohol abuse   . Tobacco abuse    Past Surgical History: History reviewed. No pertinent surgical history. HPI:  William [name]  is a 64 y.o. male admitted to [facility] d/t "falls and weakness".  PMHx of alcohol and tobacco abuse. Admitting dx acute metabolic encephalopathy with suspected Wernicke's encephalopathy. DG Chest on 5/26: "No acute process in the chest." DG Chest on 5/29: "No active disease." Hospitalization complicated by intermittent agitation, resulting in bilateral mitts, and x2 RR d/t "shallow breathing" and de-saturation into 70s. Currently on 3L O2 via [place]. Originally consuming regular/thin liquid diet upon admission. MD initiated puree/HTL diet d/t concerns for aspiration d/t ongoing congestion. SLP received orders today to complete BSE.   Assessment / Plan / Recommendation Clinical Impression  Patient presents with oral dysphagia with suspected pharyngeal dysphagia c/b decreased lingual and labial ROM, decreased mandibular ROM, cognitive feeding deficits, congested/wet cough and vocal quality at baseline resulting in open mouth posture/oral holding with all consistencies (ice, tsp/cup sips thin liquids, puree), immediate throat clearing with all consistencies, and delayed coughing with all consistencies. Prior to BSE, patient with severely dry oral cavity with thick, pink secretions (suspected puree/HTL consistencies from previous meal) requiring extensive oral care prior to PO trials. Patient disoriented to place/location, however able to follow simple directions during evaluation with no s/s of agitation/agression.  SLP followed Genoveva Ill Protocol (YSP) during  BSE today - Patient did not pass the Brief Cognitive Screen, OME, and/or the 3-oz water swallow challenge on the YSP. D/t results of the protocol, increased respiratory needs, confusion, deconditioning, and coughing/congestion noted with all consistencies, patient recommended to complete an objective swallowing test via Modified Barium Swallow Study (MBSS) to r/o pharyngeal dysphagia and aspiration on PO diet.   Education provided to MD, RN, and patient re: recommendation for an MBSS today. Will downgrade patient to NPO status - May have ice chips PRN after oral care if awake/alert and sitting upright given 100% supervision and assistance. MD, RN, and patient verbalized understanding and agreement to all education/recommendations. Will determine need to skilled ST services and/or diet recommendation following today's MBSS.   SLP Visit Diagnosis: Dysphagia, oral phase (R13.11) - w/ suspected pharyngeal dysphagia    Aspiration Risk  Mild aspiration risk;Moderate aspiration risk    Diet Recommendation NPO;Ice chips PRN after oral care   Medication Administration: Other (Comment)(Per MD/RN discretion/preference)    Other  Recommendations Oral Care Recommendations: Oral care prior to ice chip/H20   Follow up Recommendations Skilled Nursing facility      Frequency and Duration min 3x week  2 weeks       Prognosis Prognosis for Safe Diet Advancement: Fair Barriers to Reach Goals: Cognitive deficits;Behavior      Swallow Study   General Date of Onset: 12/05/19 HPI: William [name]  is a 64 y.o. male admitted to [facility] d/t "falls and weakness".  PMHx of alcohol and tobacco abuse. Admitting dx acute metabolic encephalopathy with suspected Wernicke's encephalopathy. DG Chest on 5/26: "No acute process in the chest." DG Chest on 5/29: "No active disease." Hospitalization complicated by intermittent agitation, resulting in bilateral mitts, and x2 RR d/t "shallow breathing" and de-saturation into 70s.  Currently on 3L  O2 via [place]. Originally consuming regular/thin liquid diet upon admission. MD initiated puree/HTL diet d/t concerns for aspiration d/t ongoing congestion. SLP received orders today to complete BSE. Type of Study: Bedside Swallow Evaluation Previous Swallow Assessment: No previous swallowing test reported. Diet Prior to this Study: Dysphagia 1 (puree);Honey-thick liquids Temperature Spikes Noted: N/A Respiratory Status: Nasal cannula;Other (comment)(3L) History of Recent Intubation: No Behavior/Cognition: Confused;Lethargic/Drowsy;Requires cueing Oral Cavity Assessment: Dried secretions Oral Care Completed by SLP: Yes Oral Cavity - Dentition: Missing dentition;Poor condition Self-Feeding Abilities: Total assist Patient Positioning: Upright in bed Baseline Vocal Quality: Wet Volitional Cough: Congested Volitional Swallow: (Laryngeal movement noted)    Oral/Motor/Sensory Function Overall Oral Motor/Sensory Function: Moderate impairment Facial ROM: Reduced right;Reduced left Facial Symmetry: Abnormal symmetry right Facial Sensation: Within Functional Limits Lingual ROM: Reduced right;Reduced left Lingual Symmetry: Within Functional Limits Velum: Other (comment)(Difficult to visualize velum movement) Mandible: Impaired   Ice Chips Ice chips: Impaired Presentation: Spoon Oral Phase Functional Implications: Oral holding Pharyngeal Phase Impairments: Wet Vocal Quality;Throat Clearing - Immediate;Cough - Delayed   Thin Liquid Thin Liquid: Impaired Presentation: Cup;Spoon Pharyngeal  Phase Impairments: Wet Vocal Quality;Throat Clearing - Immediate;Cough - Delayed    Nectar Thick Nectar Thick Liquid: Not tested   Honey Thick Honey Thick Liquid: Not tested   Puree Puree: Impaired Presentation: Spoon Pharyngeal Phase Impairments: Suspected delayed Swallow;Cough - Delayed   Solid     Solid: Not tested     Loni Beckwith, M.S. CCC-SLP Speech-Language Pathologist  Loni Beckwith 12/11/2019,10:00 AM

## 2019-12-11 NOTE — Progress Notes (Signed)
Pt found with Oxygen off, this RT attempted to place cannula in nose pt combative hitting this RT stated "leave me alone". Nebulizer tx attempted, pt combative refusing tx, telling this RT to leave. RN notified

## 2019-12-11 NOTE — Plan of Care (Signed)

## 2019-12-12 ENCOUNTER — Inpatient Hospital Stay: Payer: No Typology Code available for payment source

## 2019-12-12 DIAGNOSIS — J441 Chronic obstructive pulmonary disease with (acute) exacerbation: Secondary | ICD-10-CM

## 2019-12-12 DIAGNOSIS — J9601 Acute respiratory failure with hypoxia: Secondary | ICD-10-CM

## 2019-12-12 DIAGNOSIS — G9341 Metabolic encephalopathy: Secondary | ICD-10-CM

## 2019-12-12 LAB — COMPREHENSIVE METABOLIC PANEL
ALT: 22 U/L (ref 0–44)
AST: 27 U/L (ref 15–41)
Albumin: 2.3 g/dL — ABNORMAL LOW (ref 3.5–5.0)
Alkaline Phosphatase: 59 U/L (ref 38–126)
Anion gap: 9 (ref 5–15)
BUN: 15 mg/dL (ref 8–23)
CO2: 26 mmol/L (ref 22–32)
Calcium: 8.1 mg/dL — ABNORMAL LOW (ref 8.9–10.3)
Chloride: 111 mmol/L (ref 98–111)
Creatinine, Ser: 0.49 mg/dL — ABNORMAL LOW (ref 0.61–1.24)
GFR calc Af Amer: >60 mL/min (ref >60)
GFR calc non Af Amer: >60 mL/min (ref >60)
Glucose, Bld: 105 mg/dL — ABNORMAL HIGH (ref 70–99)
Potassium: 3.3 mmol/L — ABNORMAL LOW (ref 3.5–5.1)
Sodium: 146 mmol/L — ABNORMAL HIGH (ref 135–145)
Total Bilirubin: 1.6 mg/dL — ABNORMAL HIGH (ref 0.3–1.2)
Total Protein: 5.8 g/dL — ABNORMAL LOW (ref 6.5–8.1)

## 2019-12-12 LAB — CBC
HCT: 38.1 % — ABNORMAL LOW (ref 39.0–52.0)
Hemoglobin: 12.6 g/dL — ABNORMAL LOW (ref 13.0–17.0)
MCH: 33.7 pg (ref 26.0–34.0)
MCHC: 33.1 g/dL (ref 30.0–36.0)
MCV: 101.9 fL — ABNORMAL HIGH (ref 80.0–100.0)
Platelets: 110 10*3/uL — ABNORMAL LOW (ref 150–400)
RBC: 3.74 MIL/uL — ABNORMAL LOW (ref 4.22–5.81)
RDW: 13.4 % (ref 11.5–15.5)
WBC: 6.7 10*3/uL (ref 4.0–10.5)
nRBC: 0 % (ref 0.0–0.2)

## 2019-12-12 MED ORDER — THIAMINE HCL 100 MG PO TABS
100.0000 mg | ORAL_TABLET | Freq: Every day | ORAL | Status: DC
  Administered 2019-12-13 – 2019-12-18 (×6): 100 mg via ORAL
  Filled 2019-12-12 (×6): qty 1

## 2019-12-12 MED ORDER — DEXTROSE-NACL 5-0.45 % IV SOLN: INTRAVENOUS | Status: DC

## 2019-12-12 NOTE — Consult Note (Signed)
William [name], William [name] [id number] 11/02/1955 Tresa Moore, MD  Reason for Consult: Aspiration, mass posterior pharyngeal wall  HPI: 64 year old gentleman admitted as per HPI. As noted have persistent mental status changes and difficulty swallowing. A modified barium swallow showed a mass pushing into the posterior hypo-pharynx at the level of the epiglottis. Aspiration was also noted on the swallow study. A previous cervical spine CT has shown a large osteophyte at approximately this level repeat neck CT for in this finding. There was also a questionable soft tissue mass small in the right posterior pharynx seen on CT.  Allergies: No Known Allergies  ROS: Review of systems normal other than 12 systems except per HPI.  PMH:  Past Medical History:  Diagnosis Date  . Alcohol abuse   . Tobacco abuse     FH:  Family History  Problem Relation Age of Onset  . CAD Father     SH:  Social History   Socioeconomic History  . Marital status: Single    Spouse name: Not on file  . Number of children: Not on file  . Years of education: Not on file  . Highest education level: Not on file  Occupational History  . Not on file  Tobacco Use  . Smoking status: Current Every Day Smoker    Packs/day: 2.00    Types: Cigarettes  . Smokeless tobacco: Never Used  Substance and Sexual Activity  . Alcohol use: Yes    Alcohol/week: 18.0 standard drinks    Types: 18 Cans of beer per week    Comment: everyday  . Drug use: Not on file  . Sexual activity: Not on file  Other Topics Concern  . Not on file  Social History Narrative  . Not on file   Social Determinants of Health   Financial Resource Strain:   . Difficulty of Paying Living Expenses:   Food Insecurity:   . Worried About Programme researcher, broadcasting/film/video in the Last Year:   . Barista in the Last Year:   Transportation Needs:   . Freight forwarder (Medical):   Marland Kitchen Lack of Transportation (Non-Medical):   Physical Activity:   . Days of  Exercise per Week:   . Minutes of Exercise per Session:   Stress:   . Feeling of Stress :   Social Connections:   . Frequency of Communication with Friends and Family:   . Frequency of Social Gatherings with Friends and Family:   . Attends Religious Services:   . Active Member of Clubs or Organizations:   . Attends Banker Meetings:   Marland Kitchen Marital Status:   Intimate Partner Violence:   . Fear of Current or Ex-Partner:   . Emotionally Abused:   Marland Kitchen Physically Abused:   . Sexually Abused:     PSH: History reviewed. No pertinent surgical history.  Physical  Exam: CN 2-12 grossly intact and symmetric.  Oral cavity, lips, gums, ororpharynx severe dry mucous membranes, no evidence of soft tissue mass was identified. Care was taken to examine the right posterior pharyngeal area where the small pedunculated mass had been seen and identified no mass.  Nasal cavity without polyps or purulence. External nose and ears without masses or lesions. EOMI, PERRLA. Neck supple with no masses or lesions. No lymphadenopathy palpated. Thyroid normal with no masses.  Procedure: At the bedside topical anesthetic of phenylephrine lidocaine was dripped in each nostril. Approximately 5 minutes flexible fiberoptic laryngoscope was introduced into the right nasal cavity. Nasopharynx  was clear. Examination of the tongue base and posterior pharynx were clear with no evidence of mass. There was a protruding submucosal mass in the posterior hypopharyngeal wall which abutted the epiglottis. This was consistent with a large osteophyte which was noted on both the cervical spine CT and the neck CT. There was no evidence of soft tissue tumor. Limited view of the larynx do the osteophyte and the patient positioning, it appeared the vocal both vocal cords were moving, and I witnessed no aspiration.   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 This  is submucosal and with its position appears to prevent complete closure of the epiglottic protection mechanism of the larynx. Likely leads to some of the aspiration noted on the modified barium swallow. This is likely not a new finding and is probably been present for some time. I do not think that he would be an excellent surgical candidate for any bony removal due to his other comorbidities. Would not be an ENT procedure but a neurosurgery procedure, would be very risky. The small soft tissue mass noted on the CT scan of the right posterior pharyngeal wall was not visible on my exam today could have been a small mucus collection or possibly the way his uvula was laying at the time of the CT scan. A more thorough exam could be done in the outpatient clinic we will try to schedule a follow-up appointment with me there following discharge.   Roena Malady 12/12/2019 7:55 AM

## 2019-12-12 NOTE — Progress Notes (Signed)
SLP f/u  Note  Patient Details Name: William [name] MRN: [id number] DOB: 09/12/1955   Chart reviewed. Neck CT performed on 6/1 revealed prominent C3-C4 ventral bridging osteophyte which significantly encroaches upon the posterior aspect of the pharynx at the level of the epiglottis. ENT laryngoscopy performed on 6/2 confirmed that there was a protruding submucosal mass in the posterior hypopharyngeal wall which abutted the epiglottis. This was consistent with a large osteophyte which was noted on both the cervical spine CT and the neck CT. There was no evidence of soft tissue tumor.  I agree with the above findings and that pt has likely had this physiology for some time. However in the setting of his acute illness, he is no longer able to compensate and protect his airway. Given pt's high risk of aspiration across all consistencies, a safe diet cannot be recommended at this time. Unfortunately, ST services don't have anything further to help this gentleman.   Thank you for this consult, ST will sign off.    Lenis Nettleton B. Dreama Saa M.S., CCC-SLP, [facility] Speech-Language Pathologist Rehabilitation Services Office [phone]       Reuel Derby 12/12/2019, 8:47 AM

## 2019-12-12 NOTE — Progress Notes (Signed)
Physical Therapy Treatment Patient Details Name: William [name] MRN: [id number] DOB: 07-Oct-1955 Today's Date: 12/12/2019    History of Present Illness Patient is a 64 y.o. male admitted with fall with rib pain and generalized weakness after recent falls. Patient self reports 10 falls within the past 6 months due to weakness.Patient feels he is unable to care for himself at home at this time.      PT Comments    Pt was supine in bed with HOB elevated ~ 20 degrees upon arriving.  Mitts donned to BUEs but were removed during session.Reapplied at conclusion. Pt is cooperative and able to follow simple commands throughout. Oriented to setting, self, and reports" I have a bone sticking out in my neck." He agrees to PT session and OOB activity.Resting vitals:128/83, HR 95, 91 % on rm air.  Pt exited R side of bed with increased time and min/mod assist. Sat EOB x several minutes without physical assistance. Pt demonstrates fair sitting balance. Upon standing 2 x EOB to RW with min assist, pt c/o dizziness and required to return to supine. BP dropped to 97/79 with HR elevation to 127 bpm. Once repositioned in bed, symptoms resolved and pt requested discontinuing session so he can "take a nap." Therapist feels pt will benefit form SNF at DC to address strength, balance, and safe functional mobility deficts. Acute PT will continue to follow per current POC. Pt was supine in bed with bed alarm in place, mitts donned, and pt resting comfortably at conclusion of session. Rn notified of hypotension upon standing.      Follow Up Recommendations  SNF     Equipment Recommendations  3in1 (PT)    Recommendations for Other Services       Precautions / Restrictions Precautions Precautions: Fall Restrictions Weight Bearing Restrictions: No    Mobility  Bed Mobility Overal bed mobility: Needs Assistance Bed Mobility: Supine to Sit;Sit to Supine     Supine to sit: Min assist;Mod assist Sit to supine: Mod  assist   General bed mobility comments: pt was able to sit up on R side of bed with increased time and min-mod assist. Mod assist to return to supine quickly 2/2 to orthostatic hypotension   Transfers Overall transfer level: Needs assistance Equipment used: Rolling walker (2 wheeled) Transfers: Sit to/from Stand Sit to Stand: Min assist;From elevated surface         General transfer comment: pt demonstarted ability to STS from EOB with min assist + vcs for technique. c/o dizziness on first trial. 2nd trial BP dropped to 97/79 with HR elevation to 127bpm.  Ambulation/Gait             General Gait Details: did not progress to ambulation 2/2 to dizziness/fatigue hypotensive   Stairs             Wheelchair Mobility    Modified Rankin (Stroke Patients Only)       Balance                                            Cognition Arousal/Alertness: Awake/alert Behavior During Therapy: Anxious;WFL for tasks assessed/performed Overall Cognitive Status: Within Functional Limits for tasks assessed                                 General Comments: pt  is oriented to self, location, and was able to state that he has " bone sticking out in my throat". He was able to follow simple one step commands consistently but does present with altered cognition overall.      Exercises      General Comments        Pertinent Vitals/Pain Pain Assessment: No/denies pain Pain Score: 0-No pain Pain Intervention(s): Limited activity within patient's tolerance;Monitored during session;Repositioned    Home Living                      Prior Function            PT Goals (current goals can now be found in the care plan section) Acute Rehab PT Goals Patient Stated Goal: "get out of here" Progress towards PT goals: Progressing toward goals    Frequency    Min 2X/week      PT Plan Current plan remains appropriate    Co-evaluation               AM-PAC PT "6 Clicks" Mobility   Outcome Measure  Help needed turning from your back to your side while in a flat bed without using bedrails?: A Lot Help needed moving from lying on your back to sitting on the side of a flat bed without using bedrails?: A Lot Help needed moving to and from a bed to a chair (including a wheelchair)?: A Lot Help needed standing up from a chair using your arms (e.g., wheelchair or bedside chair)?: A Lot Help needed to walk in hospital room?: Total Help needed climbing 3-5 steps with a railing? : Total 6 Click Score: 10    End of Session Equipment Utilized During Treatment: Gait belt Activity Tolerance: Patient limited by fatigue;Other (comment)(c/o dizziness with minimal activity) Patient left: in bed;with call bell/phone within reach;with bed alarm set;with restraints reapplied(MItts reapplied) Nurse Communication: Mobility status PT Visit Diagnosis: Unsteadiness on feet (R26.81);Repeated falls (R29.6);Muscle weakness (generalized) (M62.81);Difficulty in walking, not elsewhere classified (R26.2)     Time: 5638-7564 PT Time Calculation (min) (ACUTE ONLY): 17 min  Charges:  $Therapeutic Activity: 8-22 mins                    Jetta Lout PTA 12/12/19, 3:21 PM

## 2019-12-12 NOTE — Progress Notes (Signed)
PROGRESS NOTE    William SpenceRobert [name]  WJX:[id number] DOB: 12/12/1955 DOA: 12/05/2019 [facility]   Brief Narrative:  William SpenceRobert [name]  is a 64 y.o. male coming in with falls and weakness.  He states he has been drinking too much and smoking too much and not eating very well.  He normally walks with a walker.  Patient coughing and has shortness of breath.  Sister had him involuntarily committed couple weeks ago.  Patient refused to go to rehab at that time.  Family has noticed a decline in the patient over the past couple years  6/2: Patient seen and examined.  Seems a bit lethargic and disheveled but does answer all my questions appropriately.  Still has some cough and shortness of breath.  Alert and oriented on my evaluation.  Seen by ENT this morning.  Status post direct laryngoscopy.  No overt signs of aspiration noted on direct visualization.  No surgical intervention warranted considering risk/benefit.  Speech therapy evaluated again and was and unable to recommended diet.    Assessment & Plan:   Active Problems:   Acute metabolic encephalopathy   COPD with acute exacerbation (HCC)   Acute respiratory failure with hypoxia (HCC)   Hepatic steatosis   Hypokalemia   Tachycardia  Acute exacerbation of COPD Acute hypoxic respiratory failure, secondary to above Has been weaned from supplemental oxygen Still with poor air movement and cough Plan: Continue Solu-Medrol 40 mg daily Plan to transition to p.o. prednisone tomorrow Bronchodilators Pulmonary hygiene  Supplemental oxygen as needed Goal saturation 88 to 92%  Dysphagia CT neck noted protruding mass Felt to be osteophyte Seen by ENT status post direct laryngoscopy No signs or symptoms of aspiration Not a surgical candidate considering high risk features Speech was unfortunately unable to recommend a diet Plan: We will start dysphagia 3 diet Discussed with nurse to observe patient eating We will scale back diet if  obvious signs of aspiration are noted  Acute toxic metabolic encephalopathy Patient's mental status has been waxing and waning Appears to be slowly improving Alert, oriented x3 my evaluation today Status post high-dose IV thiamine Now on oral thiamine MRI brain revealed tiny nonhemorrhagic infarcts Unlikely to be underlying patient's encephalopathy Strong suspicion for alcohol-related encephalopathy Plan: Continue daily p.o. thiamine Nightly Seroquel As needed Haldol Frequent reorienting measures Avoid sedatives  Alcohol abuse No signs of acute withdrawal  Hepatic steatosis Outpatient follow-up, likely secondary to alcohol use  Thrombocytopenia Likely secondary alcohol use No bleeding, will continue to monitor  Weakness Functional decline Physical therapy following Recommending rehab placement  Hypokalemia Replace potassium as needed  DVT prophylaxis: Lovenox Code Status: Full Family Communication:  Disposition Plan: Status is: Inpatient  Remains inpatient appropriate because:Inpatient level of care appropriate due to severity of illness   Dispo: The patient is from: Home              Anticipated d/c is to: SNF              Anticipated d/c date is: 1 day              Patient currently is not medically stable to d/c.  Mental status appears to be just improving today.  No agitation noted.  Last day of IV steroids today.  If respiratory status continues to improve plan on possible discharge tomorrow.   Consultants:   None  Procedures:   None  Antimicrobials:   None   Subjective: Patient seen and examined.  Remains lethargic  but answers all questions appropriately.  Still has safety mittens on.  Objective: Vitals:   12/12/19 0530 12/12/19 0802 12/12/19 0803 12/12/19 1132  BP: 115/79  122/84 113/85  Pulse: 79  83 85  Resp: 19  18 16   Temp: (!) 97.5 F (36.4 C)  98.1 F (36.7 C) 98.7 F (37.1 C)  TempSrc: Oral  Oral Oral  SpO2: 100% 94% 92%    Weight:      Height:        Intake/Output Summary (Last 24 hours) at 12/12/2019 1407 Last data filed at 12/12/2019 0656 Gross per 24 hour  Intake 697.49 ml  Output 450 ml  Net 247.49 ml   Filed Weights   12/05/19 0929 12/06/19 2301  Weight: 79.4 kg 86.6 kg    Examination:  General exam: Appears calm and comfortable  Respiratory system: Diffuse crackles bilaterally.  Normal work of breathing. Cardiovascular system: S1 & S2 heard, RRR. No JVD, murmurs, rubs, gallops or clicks. No pedal edema. Gastrointestinal system: Abdomen is nondistended, soft and nontender. No organomegaly or masses felt. Normal bowel sounds heard. Central nervous system: Alert and oriented. No focal neurological deficits. Extremities: Symmetric 5 x 5 power. Skin: No rashes, lesions or ulcers Psychiatry: Judgement and insight appear normal. Mood & affect appropriate.     Data Reviewed: I have personally reviewed following labs and imaging studies  CBC: Recent Labs  Lab 12/06/19 1444 12/07/19 0457 12/10/19 0514 12/12/19 0534  WBC 5.8 4.0 8.8 6.7  NEUTROABS 4.6  --   --   --   HGB 14.9 13.0 14.3 12.6*  HCT 43.2 39.0 42.4 38.1*  MCV 99.1 101.0* 100.2* 101.9*  PLT 132* 107* 109* 110*   Basic Metabolic Panel: Recent Labs  Lab 12/06/19 1444 12/07/19 0457 12/10/19 0514 12/12/19 0534  NA 135 140 143 146*  K 3.5 3.8 3.1* 3.3*  CL 98 104 105 111  CO2 26 29 27 26   GLUCOSE 115* 92 101* 105*  BUN 9 7* 13 15  CREATININE 0.53* 0.60* 0.48* 0.49*  CALCIUM 8.7* 8.4* 8.5* 8.1*  MG  --   --  2.2  --   PHOS  --   --  2.6  --    GFR: Estimated Creatinine Clearance: 106.8 mL/min (A) (by C-G formula based on SCr of 0.49 mg/dL (L)). Liver Function Tests: Recent Labs  Lab 12/12/19 0534  AST 27  ALT 22  ALKPHOS 59  BILITOT 1.6*  PROT 5.8*  ALBUMIN 2.3*   No results for input(s): LIPASE, AMYLASE in the last 168 hours. No results for input(s): AMMONIA in the last 168 hours. Coagulation Profile: No  results for input(s): INR, PROTIME in the last 168 hours. Cardiac Enzymes: No results for input(s): CKTOTAL, CKMB, CKMBINDEX, TROPONINI in the last 168 hours. BNP (last 3 results) No results for input(s): PROBNP in the last 8760 hours. HbA1C: No results for input(s): HGBA1C in the last 72 hours. CBG: Recent Labs  Lab 12/06/19 1257 12/07/19 1343 12/08/19 0023  GLUCAP 169* 135* 176*   Lipid Profile: No results for input(s): CHOL, HDL, LDLCALC, TRIG, CHOLHDL, LDLDIRECT in the last 72 hours. Thyroid Function Tests: No results for input(s): TSH, T4TOTAL, FREET4, T3FREE, THYROIDAB in the last 72 hours. Anemia Panel: No results for input(s): VITAMINB12, FOLATE, FERRITIN, TIBC, IRON, RETICCTPCT in the last 72 hours. Sepsis Labs: No results for input(s): PROCALCITON, LATICACIDVEN in the last 168 hours.  Recent Results (from the past 240 hour(s))  SARS Coronavirus 2 by RT PCR (  hospital order, performed in [facility] hospital lab) Nasopharyngeal Nasopharyngeal Swab     Status: None   Collection Time: 12/05/19  6:35 PM   Specimen: Nasopharyngeal Swab  Result Value Ref Range Status   SARS Coronavirus 2 NEGATIVE NEGATIVE Final    Comment: (NOTE) SARS-CoV-2 target nucleic acids are NOT DETECTED. The SARS-CoV-2 RNA is generally detectable in upper and lower respiratory specimens during the acute phase of infection. The lowest concentration of SARS-CoV-2 viral copies this assay can detect is 250 copies / mL. A negative result does not preclude SARS-CoV-2 infection and should not be used as the sole basis for treatment or other patient management decisions.  A negative result may occur with improper specimen collection / handling, submission of specimen other than nasopharyngeal swab, presence of viral mutation(s) within the areas targeted by this assay, and inadequate number of viral copies (<250 copies / mL). A negative result must be combined with clinical observations, patient history,  and epidemiological information. Fact Sheet for Patients:   StrictlyIdeas.no Fact Sheet for Healthcare Providers: BankingDealers.co.za This test is not yet approved or cleared  by the [place] FDA and has been authorized for detection and/or diagnosis of SARS-CoV-2 by FDA under an Emergency Use Authorization (EUA).  This EUA will remain in effect (meaning this test can be used) for the duration of the COVID-19 declaration under Section 564(b)(1) of the Act, 21 U.S.C. section 360bbb-3(b)(1), unless the authorization is terminated or revoked sooner. Performed at [facility], [address] [place], [place] [address]          Radiology Studies: CT SOFT TISSUE NECK W CONTRAST  Result Date: 12/11/2019 CLINICAL DATA:  Neck mass, nonpulsatile. Additional history provided: Possible neck mass seen on barium swallow today, history of COPD. EXAM: CT NECK WITH CONTRAST TECHNIQUE: Multidetector CT imaging of the neck was performed using the standard protocol following the bolus administration of intravenous contrast. CONTRAST:  49mL OMNIPAQUE IOHEXOL 300 MG/ML SOLN. Please note the patient's IV reportedly infiltrated during the examination. COMPARISON:  Modified barium swallow 12/11/2019, cervical spine CT 12/05/2018 FINDINGS: Mildly motion degraded examination. Additionally, the exam is somewhat limited due to a poor contrast bolus in the setting of IV infiltration. Pharynx and larynx: Residual contrast within the oral cavity, oropharynx and visualized esophagus from recent prior modified barium swallow. There is a 10 mm pedunculated soft tissue focus arising from the right aspect of the posterior pharynx at the level of the soft palate (series 2, image 27) (series 7, image 38). Elsewhere, there is no appreciable swelling or discrete soft tissue mass within the oral cavity, pharynx or larynx. Salivary glands: The parotid glands are atrophic.  The parotid glands are unremarkable. Thyroid: Subcentimeter partially calcified left thyroid lobe nodule not meeting consensus criteria for ultrasound follow-up. Lymph nodes: No pathologically enlarged cervical chain lymph nodes. Vascular: The major vascular structures of the neck are patent. Limited intracranial: No acute abnormality identified. Visualized orbits: Largely excluded from the field of view. No abnormality visualized. Mastoids and visualized paranasal sinuses: Partial opacification of the imaged ethmoid air cells. No significant mastoid effusion. Skeleton: No acute bony abnormality or aggressive osseous lesion. Cervical spondylosis with multilevel disc space narrowing, uncovertebral and facet hypertrophy. There is a very prominent ventral osteophyte at the C3-C4 level which significantly indents the posterior aspect of the pharynx at the level of the epiglottis. This likely accounts for the abnormality identified on modified barium swallow performed earlier the same day. Degenerative fusion of the C6-C7  vertebrae. Upper chest: Emphysema. Partially imaged opacity within the dependent aspect of the right upper lobe which may reflect atelectasis or possibly sequela of aspiration. IMPRESSION: Prominent C3-C4 ventral bridging osteophyte which significantly encroaches upon the posterior aspect of the pharynx at the level of the epiglottis. This likely accounts for the abnormality identified on modified barium swallow performed earlier the same day. Nonspecific 10 mm pedunculated soft tissue focus arising from the right aspect of the posterior pharynx at the level of the soft palate. ENT consultation and direct visualization recommended. No pathologically enlarged cervical chain lymph nodes. Incompletely imaged dependent opacity within the right upper lobe which may reflect atelectasis or possibly sequela of aspiration. Cervical spondylosis as described. Ethmoid sinusitis. Electronically Signed   By: Jackey Loge DO   On: 12/11/2019 15:41   MR BRAIN WO CONTRAST  Result Date: 12/12/2019 CLINICAL DATA:  Initial evaluation for acute altered mental status. EXAM: MRI HEAD WITHOUT CONTRAST TECHNIQUE: Multiplanar, multiecho pulse sequences of the brain and surrounding structures were obtained without intravenous contrast. COMPARISON:  Prior head CT from 12/05/2019. FINDINGS: Brain: Examination moderately degraded by motion artifact. Generalized age-related cerebral atrophy. Patchy and confluent T2/FLAIR hyperintensity within the periventricular deep white matter both cerebral hemispheres most consistent with chronic small vessel ischemic disease, moderate in nature. Punctate 5 mm acute ischemic infarcts seen involving the cortical gray matter of the high right frontal lobe (series 9, image 88). Additional 5 mm acute ischemic infarcts seen involving the subcortical anterior right frontal lobe a (series 9, image 77). Few tiny punctate acute bilateral cerebellar infarcts noted (series 9, image 62). Few punctate infarcts noted involving the cortical gray matter of the left parieto-occipital region (series 11, images 72, 74). No associated hemorrhage or mass effect. No mass lesion, midline shift, or mass effect. Mild ventricular prominence related to global parenchymal volume loss without hydrocephalus. Probable small benign arachnoid cyst noted at the left middle cranial fossa. No other extra-axial fluid collection. Pituitary gland and suprasellar region within normal limits. Midline structures intact. Vascular: Major intracranial vascular flow voids are maintained. Left cerebellar DVA noted. Skull and upper cervical spine: Craniocervical junction within normal limits. Bone marrow signal intensity normal. No scalp soft tissue abnormality. Sinuses/Orbits: Globes and orbital soft tissues within normal limits. Mild mucosal thickening noted within the ethmoidal air cells. Paranasal sinuses are otherwise clear. Small left mastoid  effusion noted, of doubtful significance. Other: None. IMPRESSION: 1. Few tiny acute ischemic nonhemorrhagic infarcts involving the bilateral cerebral and cerebellar hemispheres as above. No associated hemorrhage or mass effect. 2. Underlying age-related cerebral atrophy with moderate chronic small vessel ischemic disease. Electronically Signed   By: Rise Mu M.D.   On: 12/12/2019 03:12        Scheduled Meds:  azithromycin  250 mg Oral Daily   budesonide (PULMICORT) nebulizer solution  0.5 mg Nebulization BID   diltiazem  180 mg Oral Daily   folic acid  1 mg Oral Daily   ipratropium-albuterol  3 mL Nebulization TID   methylPREDNISolone (SOLU-MEDROL) injection  40 mg Intravenous Daily   QUEtiapine  25 mg Oral QHS   [START ON 12/13/2019] thiamine  100 mg Oral Daily   Continuous Infusions:  sodium chloride Stopped (12/11/19 1234)   dextrose 5 % and 0.45% NaCl 50 mL/hr at 12/12/19 0829     LOS: 6 days    Time spent: 35 minutes    Tresa Moore, MD [facility] Pager [phone]  If 7PM-7AM, please contact night-coverage 12/12/2019,  2:07 PM

## 2019-12-13 MED ORDER — PREDNISONE 20 MG PO TABS
20.0000 mg | ORAL_TABLET | Freq: Every day | ORAL | Status: AC
  Administered 2019-12-13 – 2019-12-17 (×5): 20 mg via ORAL
  Filled 2019-12-13 (×5): qty 1

## 2019-12-13 NOTE — Progress Notes (Signed)
Physical Therapy Treatment Patient Details Name: William [name] MRN: [id number] DOB: July 24, 1955 Today's Date: 12/13/2019    History of Present Illness Patient is a 64 y.o. male admitted with fall with rib pain and generalized weakness after recent falls. Patient self reports 10 falls within the past 6 months due to weakness.Patient feels he is unable to care for himself at home at this time.      PT Comments    Patient received in bed, had just had breathing treatment. Patient agrees to PT session. He performed bed mobility with mod independence, use of bed rails, increased time and effort. Performed sit to stand from elevated bed with min assist, required cues for hand placement to perform. Ambulated 30 feet with RW, min guard and cues for posture and safe use of ad. He will continue to benefit from skilled PT while here to improve strength, safety and functional independence.        Follow Up Recommendations  SNF;Supervision/Assistance - 24 hour     Equipment Recommendations  Other (comment)(TBD)    Recommendations for Other Services       Precautions / Restrictions Precautions Precautions: Fall Restrictions Weight Bearing Restrictions: No    Mobility  Bed Mobility Overal bed mobility: Modified Independent Bed Mobility: Supine to Sit;Sit to Supine     Supine to sit: Modified independent (Device/Increase time) Sit to supine: Modified independent (Device/Increase time)   General bed mobility comments: HOB raised, use of rails, increased time and effort.  Transfers Overall transfer level: Needs assistance Equipment used: Rolling walker (2 wheeled) Transfers: Sit to/from Stand Sit to Stand: Min assist;From elevated surface         General transfer comment: pt demonstarted ability to STS from EOB with min assist + vcs for technique. Required elevated bed to get standing.  Ambulation/Gait Ambulation/Gait assistance: Min assist Gait Distance (Feet): 30 Feet Assistive  device: Rolling walker (2 wheeled) Gait Pattern/deviations: Step-to pattern;Decreased step length - right;Decreased step length - left;Trunk flexed Gait velocity: decreased    General Gait Details: patient steady, no dizziness reported, although reports knees are weak. Cues needed to stay close to AD and for upright posture   Stairs             Wheelchair Mobility    Modified Rankin (Stroke Patients Only)       Balance Overall balance assessment: Modified Independent;Needs assistance;History of Falls Sitting-balance support: Feet supported Sitting balance-Leahy Scale: Good Sitting balance - Comments: able to sit edge of bed independently   Standing balance support: Bilateral upper extremity supported;During functional activity Standing balance-Leahy Scale: Fair Standing balance comment: Reliant on B UE assist for standing and dynamic activity, min guard for safety                            Cognition Arousal/Alertness: Awake/alert Behavior During Therapy: WFL for tasks assessed/performed Overall Cognitive Status: Within Functional Limits for tasks assessed                                 General Comments: he is more appropriate today, deficits with STM.      Exercises      General Comments        Pertinent Vitals/Pain Pain Assessment: No/denies pain    Home Living  Prior Function            PT Goals (current goals can now be found in the care plan section) Acute Rehab PT Goals Patient Stated Goal: none stated PT Goal Formulation: With patient Time For Goal Achievement: 12/20/19 Potential to Achieve Goals: Fair Progress towards PT goals: Progressing toward goals    Frequency    Min 2X/week      PT Plan Current plan remains appropriate    Co-evaluation              AM-PAC PT "6 Clicks" Mobility   Outcome Measure  Help needed turning from your back to your side while in a flat bed  without using bedrails?: None Help needed moving from lying on your back to sitting on the side of a flat bed without using bedrails?: A Little Help needed moving to and from a bed to a chair (including a wheelchair)?: A Little Help needed standing up from a chair using your arms (e.g., wheelchair or bedside chair)?: A Lot Help needed to walk in hospital room?: A Little Help needed climbing 3-5 steps with a railing? : Total 6 Click Score: 16    End of Session Equipment Utilized During Treatment: Gait belt Activity Tolerance: Patient tolerated treatment well;Patient limited by fatigue Patient left: in bed;with bed alarm set Nurse Communication: Mobility status PT Visit Diagnosis: Unsteadiness on feet (R26.81);Other abnormalities of gait and mobility (R26.89);Difficulty in walking, not elsewhere classified (R26.2);Repeated falls (R29.6);Muscle weakness (generalized) (M62.81)     Time: 1430-1450 PT Time Calculation (min) (ACUTE ONLY): 20 min  Charges:  $Gait Training: 8-22 mins                     Tyneshia Stivers, PT, GCS 12/13/19,2:58 PM

## 2019-12-13 NOTE — Progress Notes (Signed)
PROGRESS NOTE    William [name]  BZJ:[id number] DOB: [birth year] DOA: 12/05/2019 PCP: [facility]   Brief Narrative:  William [name]  is a 64 y.o. male coming in with falls and weakness.  He states he has been drinking too much and smoking too much and not eating very well.  He normally walks with a walker.  Patient coughing and has shortness of breath.  Sister had him involuntarily committed couple weeks ago.  Patient refused to go to rehab at that time.  Family has noticed a decline in the patient over the past couple years  6/2: Patient seen and examined.  Seems a bit lethargic and disheveled but does answer all my questions appropriately.  Still has some cough and shortness of breath.  Alert and oriented on my evaluation.  Seen by ENT this morning.  Status post direct laryngoscopy.  No overt signs of aspiration noted on direct visualization.  No surgical intervention warranted considering risk/benefit.  Speech therapy evaluated again and was and unable to recommended diet.  6/3: Patient seen and examined.  Much calmer.  Seems more alert than yesterday.  Answers all questions appropriately.  Request to have the mittens removed.    Assessment & Plan:   Active Problems:   Acute metabolic encephalopathy   COPD with acute exacerbation (HCC)   Acute respiratory failure with hypoxia (HCC)   Hepatic steatosis   Hypokalemia   Tachycardia  Acute exacerbation of COPD Acute hypoxic respiratory failure, secondary to above Has been weaned from supplemental oxygen Still with poor air movement and cough Plan: DC Solu-Medrol Start prednisone 20 mg daily, plan for 5-day course Bronchodilators Pulmonary hygiene  Supplemental oxygen as needed Goal saturation 88 to 92%  Dysphagia CT neck noted protruding mass Felt to be osteophyte Seen by ENT status post direct laryngoscopy No signs or symptoms of aspiration Not a surgical candidate considering high risk features Speech was  unfortunately unable to recommend a diet\ Seems to be tolerating dysphagia 3 diet without issue Plan: Continue dysphagia 3 diet Discussed with nurse to observe patient eating We will scale back diet if obvious signs of aspiration are noted  Acute toxic metabolic encephalopathy Patient's mental status has been waxing and waning Appears to be slowly improving Alert, oriented x3 my evaluation today Status post high-dose IV thiamine Now on oral thiamine MRI brain revealed tiny nonhemorrhagic infarcts Unlikely to be underlying patient's encephalopathy Strong suspicion for alcohol-related encephalopathy Plan: Continue daily p.o. thiamine Nightly Seroquel As needed Haldol Frequent reorienting measures Avoid sedatives  Alcohol abuse No signs of acute withdrawal  Hepatic steatosis Outpatient follow-up, likely secondary to alcohol use  Thrombocytopenia Likely secondary alcohol use No bleeding, will continue to monitor  Weakness Functional decline Physical therapy following Recommending rehab placement  Hypokalemia Replace potassium as needed  DVT prophylaxis: Lovenox Code Status: Full Family Communication:  Disposition Plan: Status is: Inpatient  Remains inpatient appropriate because:Inpatient level of care appropriate due to severity of illness   Dispo: The patient is from: Home              Anticipated d/c is to: SNF              Anticipated d/c date is: 1 day              Patient currently is not medically stable to d/c.  Mental status improving.  If continues to improve over the next 24 hours patient will be stable for discharge to skilled nursing facility on  12/14/2019.  Discussed with case management.   Consultants:   None  Procedures:   None  Antimicrobials:   None   Subjective: Patient seen and examined.  Remains lethargic but answers all questions appropriately.  Still has safety mittens on.  Objective: Vitals:   12/12/19 1132 12/12/19 1926  12/12/19 1938 12/13/19 0432  BP: 113/85  113/89 122/82  Pulse: 85  87 79  Resp: 16  18 20   Temp: 98.7 F (37.1 C)  (!) 97.5 F (36.4 C)   TempSrc: Oral  Oral   SpO2:  92% 94% 95%  Weight:      Height:        Intake/Output Summary (Last 24 hours) at 12/13/2019 1230 Last data filed at 12/13/2019 0000 Gross per 24 hour  Intake 432.6 ml  Output 300 ml  Net 132.6 ml   Filed Weights   12/05/19 0929 12/06/19 2301  Weight: 79.4 kg 86.6 kg    Examination:  General exam: Appears calm and comfortable  Respiratory system: Diffuse crackles bilaterally.  Normal work of breathing. Cardiovascular system: S1 & S2 heard, RRR. No JVD, murmurs, rubs, gallops or clicks. No pedal edema. Gastrointestinal system: Abdomen is nondistended, soft and nontender. No organomegaly or masses felt. Normal bowel sounds heard. Central nervous system: Alert and oriented. No focal neurological deficits. Extremities: Symmetric 5 x 5 power. Skin: No rashes, lesions or ulcers Psychiatry: Judgement and insight appear normal. Mood & affect appropriate.     Data Reviewed: I have personally reviewed following labs and imaging studies  CBC: Recent Labs  Lab 12/06/19 1444 12/07/19 0457 12/10/19 0514 12/12/19 0534  WBC 5.8 4.0 8.8 6.7  NEUTROABS 4.6  --   --   --   HGB 14.9 13.0 14.3 12.6*  HCT 43.2 39.0 42.4 38.1*  MCV 99.1 101.0* 100.2* 101.9*  PLT 132* 107* 109* 110*   Basic Metabolic Panel: Recent Labs  Lab 12/06/19 1444 12/07/19 0457 12/10/19 0514 12/12/19 0534  NA 135 140 143 146*  K 3.5 3.8 3.1* 3.3*  CL 98 104 105 111  CO2 26 29 27 26   GLUCOSE 115* 92 101* 105*  BUN 9 7* 13 15  CREATININE 0.53* 0.60* 0.48* 0.49*  CALCIUM 8.7* 8.4* 8.5* 8.1*  MG  --   --  2.2  --   PHOS  --   --  2.6  --    GFR: Estimated Creatinine Clearance: 106.8 mL/min (A) (by C-G formula based on SCr of 0.49 mg/dL (L)). Liver Function Tests: Recent Labs  Lab 12/12/19 0534  AST 27  ALT 22  ALKPHOS 59  BILITOT  1.6*  PROT 5.8*  ALBUMIN 2.3*   No results for input(s): LIPASE, AMYLASE in the last 168 hours. No results for input(s): AMMONIA in the last 168 hours. Coagulation Profile: No results for input(s): INR, PROTIME in the last 168 hours. Cardiac Enzymes: No results for input(s): CKTOTAL, CKMB, CKMBINDEX, TROPONINI in the last 168 hours. BNP (last 3 results) No results for input(s): PROBNP in the last 8760 hours. HbA1C: No results for input(s): HGBA1C in the last 72 hours. CBG: Recent Labs  Lab 12/06/19 1257 12/07/19 1343 12/08/19 0023  GLUCAP 169* 135* 176*   Lipid Profile: No results for input(s): CHOL, HDL, LDLCALC, TRIG, CHOLHDL, LDLDIRECT in the last 72 hours. Thyroid Function Tests: No results for input(s): TSH, T4TOTAL, FREET4, T3FREE, THYROIDAB in the last 72 hours. Anemia Panel: No results for input(s): VITAMINB12, FOLATE, FERRITIN, TIBC, IRON, RETICCTPCT in the last 72  hours. Sepsis Labs: No results for input(s): PROCALCITON, LATICACIDVEN in the last 168 hours.  Recent Results (from the past 240 hour(s))  SARS Coronavirus 2 by RT PCR (hospital order, performed in [facility] hospital lab) Nasopharyngeal Nasopharyngeal Swab     Status: None   Collection Time: 12/05/19  6:35 PM   Specimen: Nasopharyngeal Swab  Result Value Ref Range Status   SARS Coronavirus 2 NEGATIVE NEGATIVE Final    Comment: (NOTE) SARS-CoV-2 target nucleic acids are NOT DETECTED. The SARS-CoV-2 RNA is generally detectable in upper and lower respiratory specimens during the acute phase of infection. The lowest concentration of SARS-CoV-2 viral copies this assay can detect is 250 copies / mL. A negative result does not preclude SARS-CoV-2 infection and should not be used as the sole basis for treatment or other patient management decisions.  A negative result may occur with improper specimen collection / handling, submission of specimen other than nasopharyngeal swab, presence of viral mutation(s)  within the areas targeted by this assay, and inadequate number of viral copies (<250 copies / mL). A negative result must be combined with clinical observations, patient history, and epidemiological information. Fact Sheet for Patients:   BoilerBrush.com.cy Fact Sheet for Healthcare Providers: https://pope.com/ This test is not yet approved or cleared  by the [place] FDA and has been authorized for detection and/or diagnosis of SARS-CoV-2 by FDA under an Emergency Use Authorization (EUA).  This EUA will remain in effect (meaning this test can be used) for the duration of the COVID-19 declaration under Section 564(b)(1) of the Act, 21 U.S.C. section 360bbb-3(b)(1), unless the authorization is terminated or revoked sooner. Performed at [facility], [address], [place], [place] [address]          Radiology Studies: CT SOFT TISSUE NECK W CONTRAST  Result Date: 12/11/2019 CLINICAL DATA:  Neck mass, nonpulsatile. Additional history provided: Possible neck mass seen on barium swallow today, history of COPD. EXAM: CT NECK WITH CONTRAST TECHNIQUE: Multidetector CT imaging of the neck was performed using the standard protocol following the bolus administration of intravenous contrast. CONTRAST:  42mL OMNIPAQUE IOHEXOL 300 MG/ML SOLN. Please note the patient's IV reportedly infiltrated during the examination. COMPARISON:  Modified barium swallow 12/11/2019, cervical spine CT 12/05/2018 FINDINGS: Mildly motion degraded examination. Additionally, the exam is somewhat limited due to a poor contrast bolus in the setting of IV infiltration. Pharynx and larynx: Residual contrast within the oral cavity, oropharynx and visualized esophagus from recent prior modified barium swallow. There is a 10 mm pedunculated soft tissue focus arising from the right aspect of the posterior pharynx at the level of the soft palate (series 2, image 27) (series  7, image 38). Elsewhere, there is no appreciable swelling or discrete soft tissue mass within the oral cavity, pharynx or larynx. Salivary glands: The parotid glands are atrophic. The parotid glands are unremarkable. Thyroid: Subcentimeter partially calcified left thyroid lobe nodule not meeting consensus criteria for ultrasound follow-up. Lymph nodes: No pathologically enlarged cervical chain lymph nodes. Vascular: The major vascular structures of the neck are patent. Limited intracranial: No acute abnormality identified. Visualized orbits: Largely excluded from the field of view. No abnormality visualized. Mastoids and visualized paranasal sinuses: Partial opacification of the imaged ethmoid air cells. No significant mastoid effusion. Skeleton: No acute bony abnormality or aggressive osseous lesion. Cervical spondylosis with multilevel disc space narrowing, uncovertebral and facet hypertrophy. There is a very prominent ventral osteophyte at the C3-C4 level which significantly indents the posterior aspect of  the pharynx at the level of the epiglottis. This likely accounts for the abnormality identified on modified barium swallow performed earlier the same day. Degenerative fusion of the C6-C7 vertebrae. Upper chest: Emphysema. Partially imaged opacity within the dependent aspect of the right upper lobe which may reflect atelectasis or possibly sequela of aspiration. IMPRESSION: Prominent C3-C4 ventral bridging osteophyte which significantly encroaches upon the posterior aspect of the pharynx at the level of the epiglottis. This likely accounts for the abnormality identified on modified barium swallow performed earlier the same day. Nonspecific 10 mm pedunculated soft tissue focus arising from the right aspect of the posterior pharynx at the level of the soft palate. ENT consultation and direct visualization recommended. No pathologically enlarged cervical chain lymph nodes. Incompletely imaged dependent opacity  within the right upper lobe which may reflect atelectasis or possibly sequela of aspiration. Cervical spondylosis as described. Ethmoid sinusitis. Electronically Signed   By: Jackey LogeKyle  Golden DO   On: 12/11/2019 15:41   MR BRAIN WO CONTRAST  Result Date: 12/12/2019 CLINICAL DATA:  Initial evaluation for acute altered mental status. EXAM: MRI HEAD WITHOUT CONTRAST TECHNIQUE: Multiplanar, multiecho pulse sequences of the brain and surrounding structures were obtained without intravenous contrast. COMPARISON:  Prior head CT from 12/05/2019. FINDINGS: Brain: Examination moderately degraded by motion artifact. Generalized age-related cerebral atrophy. Patchy and confluent T2/FLAIR hyperintensity within the periventricular deep white matter both cerebral hemispheres most consistent with chronic small vessel ischemic disease, moderate in nature. Punctate 5 mm acute ischemic infarcts seen involving the cortical gray matter of the high right frontal lobe (series 9, image 88). Additional 5 mm acute ischemic infarcts seen involving the subcortical anterior right frontal lobe a (series 9, image 77). Few tiny punctate acute bilateral cerebellar infarcts noted (series 9, image 62). Few punctate infarcts noted involving the cortical gray matter of the left parieto-occipital region (series 11, images 72, 74). No associated hemorrhage or mass effect. No mass lesion, midline shift, or mass effect. Mild ventricular prominence related to global parenchymal volume loss without hydrocephalus. Probable small benign arachnoid cyst noted at the left middle cranial fossa. No other extra-axial fluid collection. Pituitary gland and suprasellar region within normal limits. Midline structures intact. Vascular: Major intracranial vascular flow voids are maintained. Left cerebellar DVA noted. Skull and upper cervical spine: Craniocervical junction within normal limits. Bone marrow signal intensity normal. No scalp soft tissue abnormality.  Sinuses/Orbits: Globes and orbital soft tissues within normal limits. Mild mucosal thickening noted within the ethmoidal air cells. Paranasal sinuses are otherwise clear. Small left mastoid effusion noted, of doubtful significance. Other: None. IMPRESSION: 1. Few tiny acute ischemic nonhemorrhagic infarcts involving the bilateral cerebral and cerebellar hemispheres as above. No associated hemorrhage or mass effect. 2. Underlying age-related cerebral atrophy with moderate chronic small vessel ischemic disease. Electronically Signed   By: Rise MuBenjamin  McClintock M.D.   On: 12/12/2019 03:12        Scheduled Meds: . azithromycin  250 mg Oral Daily  . budesonide (PULMICORT) nebulizer solution  0.5 mg Nebulization BID  . diltiazem  180 mg Oral Daily  . folic acid  1 mg Oral Daily  . ipratropium-albuterol  3 mL Nebulization TID  . predniSONE  20 mg Oral Q breakfast  . QUEtiapine  25 mg Oral QHS  . thiamine  100 mg Oral Daily   Continuous Infusions: . sodium chloride Stopped (12/11/19 1234)     LOS: 7 days    Time spent: 35 minutes    Tresa MooreSudheer B Pau Banh, MD  [facility] Pager [phone]  If 7PM-7AM, please contact night-coverage 12/13/2019, 12:30 PM

## 2019-12-13 NOTE — Progress Notes (Signed)
Per Doctor okay for patient to not have IV

## 2019-12-13 NOTE — TOC Progression Note (Addendum)
Transition of Care [facility]) - Progression Note    Patient Details  Name: William [name] MRN: [id number] Date of Birth: 05-25-1956  [facility]) CM/SW Contact  Margarito Liner, LCSW Phone Number: 12/13/2019, 11:59 AM  Clinical Narrative: [facility] in [place] does not have any beds available right now and unsure when one will open up. Left a voicemail for admissions coordinator at [facility]    1:08 pm: Left voicemail for [facility] director to determine next steps if we cannot find a [facility] bed.  Expected Discharge Plan: Skilled Nursing Facility Barriers to Discharge: Continued Medical Work up  Expected Discharge Plan and Services Expected Discharge Plan: Skilled Nursing Facility     Post Acute Care Choice: Skilled Nursing Facility Living arrangements for the past 2 months: Mobile Home                                       Social Determinants of Health (SDOH) Interventions    Readmission Risk Interventions No flowsheet data found.

## 2019-12-14 LAB — CBC WITH DIFFERENTIAL/PLATELET
Abs Immature Granulocytes: 0.01 10*3/uL (ref 0.00–0.07)
Basophils Absolute: 0 10*3/uL (ref 0.0–0.1)
Basophils Relative: 0 %
Eosinophils Absolute: 0 10*3/uL (ref 0.0–0.5)
Eosinophils Relative: 1 %
HCT: 36.1 % — ABNORMAL LOW (ref 39.0–52.0)
Hemoglobin: 12.1 g/dL — ABNORMAL LOW (ref 13.0–17.0)
Immature Granulocytes: 0 %
Lymphocytes Relative: 32 %
Lymphs Abs: 0.8 10*3/uL (ref 0.7–4.0)
MCH: 34 pg (ref 26.0–34.0)
MCHC: 33.5 g/dL (ref 30.0–36.0)
MCV: 101.4 fL — ABNORMAL HIGH (ref 80.0–100.0)
Monocytes Absolute: 0.2 10*3/uL (ref 0.1–1.0)
Monocytes Relative: 9 %
Neutro Abs: 1.5 10*3/uL — ABNORMAL LOW (ref 1.7–7.7)
Neutrophils Relative %: 58 %
Platelets: 107 10*3/uL — ABNORMAL LOW (ref 150–400)
RBC: 3.56 MIL/uL — ABNORMAL LOW (ref 4.22–5.81)
RDW: 13.1 % (ref 11.5–15.5)
WBC: 2.6 10*3/uL — ABNORMAL LOW (ref 4.0–10.5)
nRBC: 0 % (ref 0.0–0.2)

## 2019-12-14 LAB — BASIC METABOLIC PANEL
Anion gap: 8 (ref 5–15)
BUN: 14 mg/dL (ref 8–23)
CO2: 29 mmol/L (ref 22–32)
Calcium: 8.2 mg/dL — ABNORMAL LOW (ref 8.9–10.3)
Chloride: 105 mmol/L (ref 98–111)
Creatinine, Ser: 0.58 mg/dL — ABNORMAL LOW (ref 0.61–1.24)
GFR calc Af Amer: >60 mL/min (ref >60)
GFR calc non Af Amer: >60 mL/min (ref >60)
Glucose, Bld: 84 mg/dL (ref 70–99)
Potassium: 3.2 mmol/L — ABNORMAL LOW (ref 3.5–5.1)
Sodium: 142 mmol/L (ref 135–145)

## 2019-12-14 MED ORDER — BISACODYL 10 MG RE SUPP: 10.0000 mg | Freq: Every day | RECTAL | Status: DC | PRN

## 2019-12-14 MED ORDER — POTASSIUM CHLORIDE CRYS ER 20 MEQ PO TBCR
40.0000 meq | EXTENDED_RELEASE_TABLET | Freq: Once | ORAL | Status: AC
  Administered 2019-12-14: 40 meq via ORAL
  Filled 2019-12-14: qty 2

## 2019-12-14 MED ORDER — SENNOSIDES-DOCUSATE SODIUM 8.6-50 MG PO TABS
1.0000 | ORAL_TABLET | Freq: Two times a day (BID) | ORAL | Status: DC
  Administered 2019-12-14 – 2019-12-18 (×9): 1 via ORAL
  Filled 2019-12-14 (×9): qty 1

## 2019-12-14 MED ORDER — POLYETHYLENE GLYCOL 3350 17 G PO PACK
17.0000 g | PACK | Freq: Every day | ORAL | Status: DC
  Administered 2019-12-14 – 2019-12-16 (×3): 17 g via ORAL
  Filled 2019-12-14 (×3): qty 1

## 2019-12-14 NOTE — TOC Progression Note (Addendum)
Transition of Care [facility]) - Progression Note    Patient Details  Name: Cace Osorto MRN: [id number] Date of Birth: [birth year]  [facility]) CM/SW Contact  Margarito Liner, LCSW Phone Number: 12/14/2019, 9:27 AM  Clinical Narrative: Left voicemail for [facility] social worker to see if she could assist with SNF placement.    9:56 am: Per RN, patient's mentation has significantly improved and patient has not been combative the past couple of days. [facility] in [place] had denied patient earlier this week based on behaviors. CSW left voicemail for admissions coordinator to see if they would reconsider.  1:06 pm: Have left voicemails for two other [facility] social workers requesting assistance.  4:00 pm: Received call back from one of the VA social workers and said we could try one of the contracted SNF's outside of the [facility] network. She emailed Warden/ranger for [facility]. CSW emailed back and asked if she could send list for facilities closer than that since the [place] area is 3 hours away.  Expected Discharge Plan: Skilled Nursing Facility Barriers to Discharge: Continued Medical Work up  Expected Discharge Plan and Services Expected Discharge Plan: Skilled Nursing Facility     Post Acute Care Choice: Skilled Nursing Facility Living arrangements for the past 2 months: Mobile Home                                       Social Determinants of Health (SDOH) Interventions    Readmission Risk Interventions No flowsheet data found.

## 2019-12-14 NOTE — Progress Notes (Signed)
PROGRESS NOTE    General Wearing  ERX:[id number] DOB: 04-07-1956 DOA: 12/05/2019 PCP: [facility]   Brief Narrative:  William [name]  is a 64 y.o. male coming in with falls and weakness.  He states he has been drinking too much and smoking too much and not eating very well.  He normally walks with a walker.  Patient coughing and has shortness of breath.  Sister had him involuntarily committed couple weeks ago.  Patient refused to go to rehab at that time.  Family has noticed a decline in the patient over the past couple years  6/2: Patient seen and examined.  Seems a bit lethargic and disheveled but does answer all my questions appropriately.  Still has some cough and shortness of breath.  Alert and oriented on my evaluation.  Seen by ENT this morning.  Status post direct laryngoscopy.  No overt signs of aspiration noted on direct visualization.  No surgical intervention warranted considering risk/benefit.  Speech therapy evaluated again and was and unable to recommended diet.  6/3: Patient seen and examined.  Much calmer.  Seems more alert than yesterday.  Answers all questions appropriately.  Request to have the mittens removed.  6/4: Patient seen and examined.  Mental status much improved.  No longer agitated or pulling at lines.  Answering all questions appropriately.  Per nurse patient has not had a BM in 5 days.     Assessment & Plan:   Active Problems:   Acute metabolic encephalopathy   COPD with acute exacerbation (HCC)   Acute respiratory failure with hypoxia (HCC)   Hepatic steatosis   Hypokalemia   Tachycardia  Acute exacerbation of COPD Acute hypoxic respiratory failure, secondary to above Has been weaned from supplemental oxygen Still with poor air movement and cough Plan: Continue prednisone 20 mg daily, plan for 5-day course Bronchodilators Pulmonary hygiene  Supplemental oxygen as needed Goal saturation 88 to 92%  Constipation No BM in 5 days Abdomen not  tender or distended Positive bowel sounds Initiate p.o. and PR bowel regimen  Dysphagia CT neck noted protruding mass Felt to be osteophyte Seen by ENT status post direct laryngoscopy No signs or symptoms of aspiration Not a surgical candidate considering high risk features Speech was unfortunately unable to recommend a diet\ Seems to be tolerating dysphagia 3 diet without issue Plan: Continue dysphagia 3 diet Discussed with nurse to observe patient eating We will scale back diet if obvious signs of aspiration are noted  Acute toxic metabolic encephalopathy Patient's mental status has been waxing and waning Appears to be slowly improving Alert, oriented x3 my evaluation today Status post high-dose IV thiamine Now on oral thiamine MRI brain revealed tiny nonhemorrhagic infarcts Unlikely to be underlying patient's encephalopathy Strong suspicion for alcohol-related encephalopathy Plan: Continue daily p.o. thiamine Nightly Seroquel As needed Haldol Frequent reorienting measures Avoid sedatives  Alcohol abuse No signs of acute withdrawal  Hepatic steatosis Outpatient follow-up, likely secondary to alcohol use  Thrombocytopenia Likely secondary alcohol use No bleeding, will continue to monitor  Weakness Functional decline Physical therapy following Recommending rehab placement  Hypokalemia Replace potassium as needed  DVT prophylaxis: Lovenox Code Status: Full Family Communication:  Disposition Plan: Status is: Inpatient  Remains inpatient appropriate because:Unsafe d/c plan   Dispo: The patient is from: Home              Anticipated d/c is to: SNF              Anticipated d/c date  is: 1 day              Patient currently is medically stable to d/c.  Patient medically ready for discharge at this time.  Pending facility exceptions.     Consultants:   None  Procedures:   None  Antimicrobials:   None   Subjective: Patient seen and examined.   Mental status much improved.  Calm and appropriate.  No distress  Objective: Vitals:   12/13/19 1939 12/13/19 2058 12/14/19 0430 12/14/19 1129  BP: 108/75  106/72 93/83  Pulse: 70  68 91  Resp: 20  20   Temp: 97.6 F (36.4 C)  97.6 F (36.4 C) 97.9 F (36.6 C)  TempSrc: Oral  Oral   SpO2: 96% 93% 93% 95%  Weight:      Height:        Intake/Output Summary (Last 24 hours) at 12/14/2019 1310 Last data filed at 12/14/2019 1008 Gross per 24 hour  Intake 240 ml  Output 500 ml  Net -260 ml   Filed Weights   12/05/19 0929 12/06/19 2301  Weight: 79.4 kg 86.6 kg    Examination:  General exam: Appears calm and comfortable  Respiratory system: Diffuse crackles bilaterally.  Normal work of breathing. Cardiovascular system: S1 & S2 heard, RRR. No JVD, murmurs, rubs, gallops or clicks. No pedal edema. Gastrointestinal system: Abdomen is nondistended, soft and nontender. No organomegaly or masses felt. Normal bowel sounds heard. Central nervous system: Alert and oriented. No focal neurological deficits. Extremities: Symmetric 5 x 5 power. Skin: No rashes, lesions or ulcers Psychiatry: Judgement and insight appear normal. Mood & affect appropriate.     Data Reviewed: I have personally reviewed following labs and imaging studies  CBC: Recent Labs  Lab 12/10/19 0514 12/12/19 0534 12/14/19 0819  WBC 8.8 6.7 2.6*  NEUTROABS  --   --  1.5*  HGB 14.3 12.6* 12.1*  HCT 42.4 38.1* 36.1*  MCV 100.2* 101.9* 101.4*  PLT 109* 110* 107*   Basic Metabolic Panel: Recent Labs  Lab 12/10/19 0514 12/12/19 0534 12/14/19 0819  NA 143 146* 142  K 3.1* 3.3* 3.2*  CL 105 111 105  CO2 27 26 29   GLUCOSE 101* 105* 84  BUN 13 15 14   CREATININE 0.48* 0.49* 0.58*  CALCIUM 8.5* 8.1* 8.2*  MG 2.2  --   --   PHOS 2.6  --   --    GFR: Estimated Creatinine Clearance: 106.8 mL/min (A) (by C-G formula based on SCr of 0.58 mg/dL (L)). Liver Function Tests: Recent Labs  Lab 12/12/19 0534  AST 27   ALT 22  ALKPHOS 59  BILITOT 1.6*  PROT 5.8*  ALBUMIN 2.3*   No results for input(s): LIPASE, AMYLASE in the last 168 hours. No results for input(s): AMMONIA in the last 168 hours. Coagulation Profile: No results for input(s): INR, PROTIME in the last 168 hours. Cardiac Enzymes: No results for input(s): CKTOTAL, CKMB, CKMBINDEX, TROPONINI in the last 168 hours. BNP (last 3 results) No results for input(s): PROBNP in the last 8760 hours. HbA1C: No results for input(s): HGBA1C in the last 72 hours. CBG: Recent Labs  Lab 12/07/19 1343 12/08/19 0023  GLUCAP 135* 176*   Lipid Profile: No results for input(s): CHOL, HDL, LDLCALC, TRIG, CHOLHDL, LDLDIRECT in the last 72 hours. Thyroid Function Tests: No results for input(s): TSH, T4TOTAL, FREET4, T3FREE, THYROIDAB in the last 72 hours. Anemia Panel: No results for input(s): VITAMINB12, FOLATE, FERRITIN, TIBC, IRON, RETICCTPCT in  the last 72 hours. Sepsis Labs: No results for input(s): PROCALCITON, LATICACIDVEN in the last 168 hours.  Recent Results (from the past 240 hour(s))  SARS Coronavirus 2 by RT PCR (hospital order, performed in [facility] hospital lab) Nasopharyngeal Nasopharyngeal Swab     Status: None   Collection Time: 12/05/19  6:35 PM   Specimen: Nasopharyngeal Swab  Result Value Ref Range Status   SARS Coronavirus 2 NEGATIVE NEGATIVE Final    Comment: (NOTE) SARS-CoV-2 target nucleic acids are NOT DETECTED. The SARS-CoV-2 RNA is generally detectable in upper and lower respiratory specimens during the acute phase of infection. The lowest concentration of SARS-CoV-2 viral copies this assay can detect is 250 copies / mL. A negative result does not preclude SARS-CoV-2 infection and should not be used as the sole basis for treatment or other patient management decisions.  A negative result may occur with improper specimen collection / handling, submission of specimen other than nasopharyngeal swab, presence of viral  mutation(s) within the areas targeted by this assay, and inadequate number of viral copies (<250 copies / mL). A negative result must be combined with clinical observations, patient history, and epidemiological information. Fact Sheet for Patients:   BoilerBrush.com.cy Fact Sheet for Healthcare Providers: https://pope.com/ This test is not yet approved or cleared  by the [place] FDA and has been authorized for detection and/or diagnosis of SARS-CoV-2 by FDA under an Emergency Use Authorization (EUA).  This EUA will remain in effect (meaning this test can be used) for the duration of the COVID-19 declaration under Section 564(b)(1) of the Act, 21 U.S.C. section 360bbb-3(b)(1), unless the authorization is terminated or revoked sooner. Performed at [facility], [address] [place], [place] [address]          Radiology Studies: No results found.      Scheduled Meds: . azithromycin  250 mg Oral Daily  . budesonide (PULMICORT) nebulizer solution  0.5 mg Nebulization BID  . diltiazem  180 mg Oral Daily  . folic acid  1 mg Oral Daily  . ipratropium-albuterol  3 mL Nebulization TID  . polyethylene glycol  17 g Oral Daily  . predniSONE  20 mg Oral Q breakfast  . QUEtiapine  25 mg Oral QHS  . senna-docusate  1 tablet Oral BID  . thiamine  100 mg Oral Daily   Continuous Infusions: . sodium chloride Stopped (12/11/19 1234)     LOS: 8 days    Time spent: 35 minutes    Tresa Moore, MD [facility] Pager [phone]  If 7PM-7AM, please contact night-coverage 12/14/2019, 1:10 PM

## 2019-12-15 MED ORDER — IPRATROPIUM-ALBUTEROL 0.5-2.5 (3) MG/3ML IN SOLN
3.0000 mL | Freq: Two times a day (BID) | RESPIRATORY_TRACT | Status: DC
  Administered 2019-12-15 – 2019-12-18 (×6): 3 mL via RESPIRATORY_TRACT
  Filled 2019-12-15 (×6): qty 3

## 2019-12-15 NOTE — Progress Notes (Signed)
Physical Therapy Treatment Patient Details Name: William [name] MRN: [id number] DOB: 1955-07-18 Today's Date: 12/15/2019    History of Present Illness Patient is a 64 y.o. male admitted with fall with rib pain and generalized weakness after recent falls. Patient self reports 10 falls within the past 6 months due to weakness.Patient feels he is unable to care for himself at home at this time.      PT Comments    Pt is making good progress towards goals with ability to ambulate around RN station, however needs multiple rest breaks secondary to fatigue. Needs hands on assist for ambulation and safety. Severe forward flexed posture with RW getting away from him increasing fall risk. Will continue to progress as able. Currently still recommending SNF.   Follow Up Recommendations  SNF     Equipment Recommendations  Rolling walker with 5" wheels    Recommendations for Other Services       Precautions / Restrictions Precautions Precautions: Fall Restrictions Weight Bearing Restrictions: No    Mobility  Bed Mobility Overal bed mobility: Modified Independent Bed Mobility: Supine to Sit;Sit to Supine     Supine to sit: Modified independent (Device/Increase time)     General bed mobility comments: Safe technique. Fatigues quickly and needs rest break once seated at EOB  Transfers Overall transfer level: Needs assistance Equipment used: Rolling walker (2 wheeled) Transfers: Sit to/from Stand Sit to Stand: Min assist         General transfer comment: needs elevated bed to perform transfers. RW used. Forward flexed posture noted  Ambulation/Gait Ambulation/Gait assistance: Min assist Gait Distance (Feet): 200 Feet Assistive device: Rolling walker (2 wheeled) Gait Pattern/deviations: Step-to pattern;Decreased step length - right;Decreased step length - left;Trunk flexed     General Gait Details: needs multiple cues for safety including obstacle avoidance, posture and keeps the RW  too far away from body. Fatigues quickly and needs multiple standing rest breaks with exertion.  Slow gait speed   Stairs             Wheelchair Mobility    Modified Rankin (Stroke Patients Only)       Balance Overall balance assessment: Modified Independent;Needs assistance;History of Falls Sitting-balance support: Feet supported Sitting balance-Leahy Scale: Good     Standing balance support: Bilateral upper extremity supported;During functional activity Standing balance-Leahy Scale: Fair Standing balance comment: Reliant on B UE assist for standing and dynamic activity, min guard for safety                            Cognition Arousal/Alertness: Awake/alert Behavior During Therapy: WFL for tasks assessed/performed Overall Cognitive Status: Within Functional Limits for tasks assessed                                        Exercises Other Exercises Other Exercises: supine ther-ex performed on B LE including AP, hip abd/add, and SLRs. All ther-ex performed x 10 reps with cga. Fatigues quickly    General Comments        Pertinent Vitals/Pain Pain Assessment: No/denies pain    Home Living                      Prior Function            PT Goals (current goals can now be found in the care plan  section) Acute Rehab PT Goals Patient Stated Goal: none stated PT Goal Formulation: With patient Time For Goal Achievement: 12/20/19 Potential to Achieve Goals: Fair Progress towards PT goals: Progressing toward goals    Frequency    Min 2X/week      PT Plan Current plan remains appropriate    Co-evaluation              AM-PAC PT "6 Clicks" Mobility   Outcome Measure  Help needed turning from your back to your side while in a flat bed without using bedrails?: None Help needed moving from lying on your back to sitting on the side of a flat bed without using bedrails?: A Little Help needed moving to and from a bed  to a chair (including a wheelchair)?: A Little Help needed standing up from a chair using your arms (e.g., wheelchair or bedside chair)?: A Little Help needed to walk in hospital room?: A Little Help needed climbing 3-5 steps with a railing? : A Lot 6 Click Score: 18    End of Session Equipment Utilized During Treatment: Gait belt Activity Tolerance: Patient tolerated treatment well;Patient limited by fatigue Patient left: in bed;with bed alarm set Nurse Communication: Mobility status PT Visit Diagnosis: Unsteadiness on feet (R26.81);Other abnormalities of gait and mobility (R26.89);Difficulty in walking, not elsewhere classified (R26.2);Repeated falls (R29.6);Muscle weakness (generalized) (M62.81)     Time: 4076-8088 PT Time Calculation (min) (ACUTE ONLY): 23 min  Charges:  $Gait Training: 8-22 mins $Therapeutic Exercise: 8-22 mins                     Elizabeth Palau, PT, DPT [phone]    William [name] 12/15/2019, 3:52 PM

## 2019-12-15 NOTE — Progress Notes (Signed)
PROGRESS NOTE    William [name]  UMP:[id number] DOB: 1956/02/20 DOA: 12/05/2019 PCP: [facility]   Brief Narrative:  Layton Naves  is a 64 y.o. male coming in with falls and weakness.  He states he has been drinking too much and smoking too much and not eating very well.  He normally walks with a walker.  Patient coughing and has shortness of breath.  Sister had him involuntarily committed couple weeks ago.  Patient refused to go to rehab at that time.  Family has noticed a decline in the patient over the past couple years  6/2: Patient seen and examined.  Seems a bit lethargic and disheveled but does answer all my questions appropriately.  Still has some cough and shortness of breath.  Alert and oriented on my evaluation.  Seen by ENT this morning.  Status post direct laryngoscopy.  No overt signs of aspiration noted on direct visualization.  No surgical intervention warranted considering risk/benefit.  Speech therapy evaluated again and was and unable to recommended diet.  6/3: Patient seen and examined.  Much calmer.  Seems more alert than yesterday.  Answers all questions appropriately.  Request to have the mittens removed.  6/4: Patient seen and examined.  Mental status much improved.  No longer agitated or pulling at lines.  Answering all questions appropriately.  Per nurse patient has not had a BM in 5 days.   6/5: Patient seen and examined.  Mental status appears to be approaching or at baseline no longer agitated.  Today was the first day he has questions about how long he been in the hospital and what exactly was wrong with him.  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  Now he had a few questions.  These were all answered to satisfaction.  Patient is currently stable for discharge at this time.  We are currently awaiting skilled nursing facility placement.    Assessment & Plan:   Active Problems:  Acute metabolic encephalopathy   COPD with acute exacerbation (HCC)   Acute respiratory failure with hypoxia (HCC)   Hepatic steatosis   Hypokalemia   Tachycardia  Acute exacerbation of COPD Acute hypoxic respiratory failure, secondary to above Has been weaned from supplemental oxygen Air movement and cough improving Plan: Continue prednisone 20 mg daily, plan for 5-day course Bronchodilators Pulmonary hygiene  Supplemental oxygen as needed Goal saturation 88 to 92% Incentive spirometry and flutter valve  Constipation No BM in 5 days Abdomen not tender or distended Positive bowel sounds Initiate p.o. and PR bowel regimen  Dysphagia CT neck noted protruding mass Felt to be osteophyte Seen by ENT status post direct laryngoscopy No signs or symptoms of aspiration Not a surgical candidate considering high risk features Speech was unfortunately unable to recommend a diet\ Seems to be tolerating dysphagia 3 diet without issue Plan: Continue dysphagia 3 diet Discussed with nurse to observe patient eating We will scale back diet if obvious signs of aspiration are noted  Acute toxic metabolic encephalopathy Patient's mental status has been waxing and waning Appears to be slowly improving Alert, oriented x3 my evaluation today Status post high-dose IV thiamine Now on oral thiamine MRI brain revealed tiny nonhemorrhagic infarcts Unlikely to be underlying patient's encephalopathy Strong suspicion for alcohol-related encephalopathy Plan: Continue daily p.o. thiamine Nightly Seroquel As needed Haldol Frequent reorienting measures Avoid sedatives  Alcohol abuse No signs of acute withdrawal  Hepatic steatosis Outpatient follow-up, likely secondary to  alcohol use  Thrombocytopenia Likely secondary alcohol use No bleeding, will continue to monitor  Weakness Functional decline Physical therapy following Recommending rehab placement  Hypokalemia Replace potassium as  needed  DVT prophylaxis: Lovenox Code Status: Full Family Communication:  Disposition Plan: Status is: Inpatient  Remains inpatient appropriate because:Unsafe d/c plan   Dispo: The patient is from: Home              Anticipated d/c is to: SNF              Anticipated d/c date is: 1 day              Patient currently is medically stable to d/c.  Patient medically ready for discharge at this time.  Pending facility acceptance.     Consultants:   None  Procedures:   None  Antimicrobials:   None   Subjective: Patient seen and examined.  Mental status much improved.  Calm and appropriate.  No distress  Objective: Vitals:   12/14/19 2008 12/14/19 2126 12/15/19 0814 12/15/19 0823  BP: 117/79   124/76  Pulse: 75  85 70  Resp: (!) 22  18 20   Temp: 97.8 F (36.6 C)   (!) 97.3 F (36.3 C)  TempSrc: Oral   Axillary  SpO2: 96% 95% 93% 100%  Weight:      Height:        Intake/Output Summary (Last 24 hours) at 12/15/2019 1220 Last data filed at 12/15/2019 02/14/2020 Gross per 24 hour  Intake 600 ml  Output 350 ml  Net 250 ml   Filed Weights   12/05/19 0929 12/06/19 2301  Weight: 79.4 kg 86.6 kg    Examination:  General exam: Appears calm and comfortable  Respiratory system: Diffuse crackles bilaterally.  Normal work of breathing. Cardiovascular system: S1 & S2 heard, RRR. No JVD, murmurs, rubs, gallops or clicks. No pedal edema. Gastrointestinal system: Abdomen is nondistended, soft and nontender. No organomegaly or masses felt. Normal bowel sounds heard. Central nervous system: Alert and oriented. No focal neurological deficits. Extremities: Symmetric 5 x 5 power. Skin: No rashes, lesions or ulcers Psychiatry: Judgement and insight appear normal. Mood & affect appropriate.     Data Reviewed: I have personally reviewed following labs and imaging studies  CBC: Recent Labs  Lab 12/10/19 0514 12/12/19 0534 12/14/19 0819  WBC 8.8 6.7 2.6*  NEUTROABS  --   --   1.5*  HGB 14.3 12.6* 12.1*  HCT 42.4 38.1* 36.1*  MCV 100.2* 101.9* 101.4*  PLT 109* 110* 107*   Basic Metabolic Panel: Recent Labs  Lab 12/10/19 0514 12/12/19 0534 12/14/19 0819  NA 143 146* 142  K 3.1* 3.3* 3.2*  CL 105 111 105  CO2 27 26 29   GLUCOSE 101* 105* 84  BUN 13 15 14   CREATININE 0.48* 0.49* 0.58*  CALCIUM 8.5* 8.1* 8.2*  MG 2.2  --   --   PHOS 2.6  --   --    GFR: Estimated Creatinine Clearance: 106.8 mL/min (A) (by C-G formula based on SCr of 0.58 mg/dL (L)). Liver Function Tests: Recent Labs  Lab 12/12/19 0534  AST 27  ALT 22  ALKPHOS 59  BILITOT 1.6*  PROT 5.8*  ALBUMIN 2.3*   No results for input(s): LIPASE, AMYLASE in the last 168 hours. No results for input(s): AMMONIA in the last 168 hours. Coagulation Profile: No results for input(s): INR, PROTIME in the last 168 hours. Cardiac Enzymes: No results for input(s): CKTOTAL, CKMB, CKMBINDEX,  TROPONINI in the last 168 hours. BNP (last 3 results) No results for input(s): PROBNP in the last 8760 hours. HbA1C: No results for input(s): HGBA1C in the last 72 hours. CBG: No results for input(s): GLUCAP in the last 168 hours. Lipid Profile: No results for input(s): CHOL, HDL, LDLCALC, TRIG, CHOLHDL, LDLDIRECT in the last 72 hours. Thyroid Function Tests: No results for input(s): TSH, T4TOTAL, FREET4, T3FREE, THYROIDAB in the last 72 hours. Anemia Panel: No results for input(s): VITAMINB12, FOLATE, FERRITIN, TIBC, IRON, RETICCTPCT in the last 72 hours. Sepsis Labs: No results for input(s): PROCALCITON, LATICACIDVEN in the last 168 hours.  Recent Results (from the past 240 hour(s))  SARS Coronavirus 2 by RT PCR (hospital order, performed in [facility] hospital lab) Nasopharyngeal Nasopharyngeal Swab     Status: None   Collection Time: 12/05/19  6:35 PM   Specimen: Nasopharyngeal Swab  Result Value Ref Range Status   SARS Coronavirus 2 NEGATIVE NEGATIVE Final    Comment: (NOTE) SARS-CoV-2 target  nucleic acids are NOT DETECTED. The SARS-CoV-2 RNA is generally detectable in upper and lower respiratory specimens during the acute phase of infection. The lowest concentration of SARS-CoV-2 viral copies this assay can detect is 250 copies / mL. A negative result does not preclude SARS-CoV-2 infection and should not be used as the sole basis for treatment or other patient management decisions.  A negative result may occur with improper specimen collection / handling, submission of specimen other than nasopharyngeal swab, presence of viral mutation(s) within the areas targeted by this assay, and inadequate number of viral copies (<250 copies / mL). A negative result must be combined with clinical observations, patient history, and epidemiological information. Fact Sheet for Patients:   StrictlyIdeas.no Fact Sheet for Healthcare Providers: BankingDealers.co.za This test is not yet approved or cleared  by the [place] FDA and has been authorized for detection and/or diagnosis of SARS-CoV-2 by FDA under an Emergency Use Authorization (EUA).  This EUA will remain in effect (meaning this test can be used) for the duration of the COVID-19 declaration under Section 564(b)(1) of the Act, 21 U.S.C. section 360bbb-3(b)(1), unless the authorization is terminated or revoked sooner. Performed at [facility], [address], [place], [place] [address]          Radiology Studies: No results found.      Scheduled Meds: . budesonide (PULMICORT) nebulizer solution  0.5 mg Nebulization BID  . diltiazem  180 mg Oral Daily  . folic acid  1 mg Oral Daily  . ipratropium-albuterol  3 mL Nebulization BID  . polyethylene glycol  17 g Oral Daily  . predniSONE  20 mg Oral Q breakfast  . QUEtiapine  25 mg Oral QHS  . senna-docusate  1 tablet Oral BID  . thiamine  100 mg Oral Daily   Continuous Infusions: . sodium chloride Stopped  (12/11/19 1234)     LOS: 9 days    Time spent: 35 minutes    Sidney Ace, MD [facility] Pager [phone]  If 7PM-7AM, please contact night-coverage 12/15/2019, 12:20 PM

## 2019-12-16 MED ORDER — POLYETHYLENE GLYCOL 3350 17 G PO PACK: 17.0000 g | PACK | Freq: Every day | ORAL | Status: DC | PRN

## 2019-12-16 NOTE — Progress Notes (Signed)
Patient requesting toenails clipped. Nails thick and long, will probably need podiatry consult. Assisted patient with bath, washed hair and partial shave.

## 2019-12-16 NOTE — Progress Notes (Signed)
   12/16/19 1435  Clinical Encounter Type  Visited With Patient;Health care provider  Visit Type Initial;Spiritual support  Referral From Other (Comment)  Consult/Referral To Chaplain  Chaplain responded to page. When she called, the nurse secretary said patient wanted a Bible with large print. I told her that I would see what we have. Chaplain found a NT in large print and took it to patient. Patient was happy to receive the NT. Staff was doing something with patient, therefor chaplain did not stay.

## 2019-12-16 NOTE — Progress Notes (Signed)
PROGRESS NOTE    William [name]  MVE:[id number] DOB: Nov 22, 1955 DOA: 12/05/2019 PCP: [facility]   Brief Narrative:  William [name]  is a 64 y.o. male coming in with falls and weakness.  He states he has been drinking too much and smoking too much and not eating very well.  He normally walks with a walker.  Patient coughing and has shortness of breath.  Sister had him involuntarily committed couple weeks ago.  Patient refused to go to rehab at that time.  Family has noticed a decline in the patient over the past couple years  6/2: Patient seen and examined.  Seems a bit lethargic and disheveled but does answer all my questions appropriately.  Still has some cough and shortness of breath.  Alert and oriented on my evaluation.  Seen by ENT this morning.  Status post direct laryngoscopy.  No overt signs of aspiration noted on direct visualization.  No surgical intervention warranted considering risk/benefit.  Speech therapy evaluated again and was and unable to recommended diet.  6/3: Patient seen and examined.  Much calmer.  Seems more alert than yesterday.  Answers all questions appropriately.  Request to have the mittens removed.  6/4: Patient seen and examined.  Mental status much improved.  No longer agitated or pulling at lines.  Answering all questions appropriately.  Per nurse patient has not had a BM in 5 days.   6/5: Patient seen and examined.  Mental status appears to be approaching or at baseline no longer agitated.  Today was the first day he has questions about how long he been in the hospital and what exactly was wrong with him.  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  Now he had a few questions.  These were all answered to satisfaction.  Patient is currently stable for discharge at this time.  We are currently awaiting skilled nursing facility placement.  6/6: Patient seen and examined.  Mental status  improved.  Appetite also improved.  No acute distress.  Stable for discharge at this time.  Pending skilled nursing facility placement.    Assessment & Plan:   Active Problems:   Acute metabolic encephalopathy   COPD with acute exacerbation (HCC)   Acute respiratory failure with hypoxia (HCC)   Hepatic steatosis   Hypokalemia   Tachycardia  Acute exacerbation of COPD Acute hypoxic respiratory failure, secondary to above Has been weaned from supplemental oxygen Air movement and cough improving Plan: Continue prednisone 20 mg daily, plan for 5-day course, last dose on 6/7 Bronchodilators Pulmonary hygiene  Supplemental oxygen as needed Goal saturation 88 to 92% Incentive spirometry and flutter valve  Constipation, resolved No BM in 5 days Abdomen not tender or distended Positive bowel sounds BM reported on 12/15/19  Dysphagia CT neck noted protruding mass Felt to be osteophyte Seen by ENT status post direct laryngoscopy No signs or symptoms of aspiration Not a surgical candidate considering high risk features Speech was unfortunately unable to recommend a diet\ Seems to be tolerating dysphagia 3 diet without issue Plan: Continue dysphagia 3 diet Discussed with nurse to observe patient eating We will scale back diet if obvious signs of aspiration are noted  Acute toxic metabolic encephalopathy Patient's mental status has been waxing and waning Appears to be slowly improving Alert, oriented x3 my evaluation today Status post high-dose IV thiamine Now on oral thiamine MRI brain revealed tiny nonhemorrhagic infarcts Unlikely to be underlying patient's encephalopathy  Strong suspicion for alcohol-related encephalopathy Plan: Continue daily p.o. thiamine Nightly Seroquel As needed Haldol Frequent reorienting measures Avoid sedatives  Alcohol abuse No signs of acute withdrawal  Hepatic steatosis Outpatient follow-up, likely secondary to alcohol  use  Thrombocytopenia Likely secondary alcohol use No bleeding, will continue to monitor  Weakness Functional decline Physical therapy following Recommending rehab placement  Hypokalemia Replace potassium as needed  DVT prophylaxis: Lovenox Code Status: Full Family Communication:  Disposition Plan: Status is: Inpatient  Remains inpatient appropriate because:Unsafe d/c plan   Dispo: The patient is from: Home              Anticipated d/c is to: SNF              Anticipated d/c date is: 1 day              Patient currently is medically stable to d/c.  Patient medically ready for discharge at this time.  Pending facility acceptance.     Consultants:   None  Procedures:   None  Antimicrobials:   None   Subjective: Patient seen and examined.  Mental status much improved.  Calm and appropriate.  No distress  Objective: Vitals:   12/15/19 2001 12/15/19 2046 12/16/19 0559 12/16/19 0743  BP: 116/81  111/76   Pulse: 78  78 81  Resp: 20   16  Temp: 98.4 F (36.9 C)  97.6 F (36.4 C)   TempSrc: Oral  Oral   SpO2: 96% 96% 97% 93%  Weight:      Height:        Intake/Output Summary (Last 24 hours) at 12/16/2019 1035 Last data filed at 12/16/2019 0926 Gross per 24 hour  Intake 760 ml  Output 700 ml  Net 60 ml   Filed Weights   12/05/19 0929 12/06/19 2301  Weight: 79.4 kg 86.6 kg    Examination:  General exam: Appears calm and comfortable  Respiratory system: Diffuse crackles bilaterally.  Normal work of breathing. Cardiovascular system: S1 & S2 heard, RRR. No JVD, murmurs, rubs, gallops or clicks. No pedal edema. Gastrointestinal system: Abdomen is nondistended, soft and nontender. No organomegaly or masses felt. Normal bowel sounds heard. Central nervous system: Alert and oriented. No focal neurological deficits. Extremities: Symmetric 5 x 5 power. Skin: No rashes, lesions or ulcers Psychiatry: Judgement and insight appear normal. Mood & affect  appropriate.     Data Reviewed: I have personally reviewed following labs and imaging studies  CBC: Recent Labs  Lab 12/10/19 0514 12/12/19 0534 12/14/19 0819  WBC 8.8 6.7 2.6*  NEUTROABS  --   --  1.5*  HGB 14.3 12.6* 12.1*  HCT 42.4 38.1* 36.1*  MCV 100.2* 101.9* 101.4*  PLT 109* 110* 762*   Basic Metabolic Panel: Recent Labs  Lab 12/10/19 0514 12/12/19 0534 12/14/19 0819  NA 143 146* 142  K 3.1* 3.3* 3.2*  CL 105 111 105  CO2 27 26 29   GLUCOSE 101* 105* 84  BUN 13 15 14   CREATININE 0.48* 0.49* 0.58*  CALCIUM 8.5* 8.1* 8.2*  MG 2.2  --   --   PHOS 2.6  --   --    GFR: Estimated Creatinine Clearance: 106.8 mL/min (A) (by C-G formula based on SCr of 0.58 mg/dL (L)). Liver Function Tests: Recent Labs  Lab 12/12/19 0534  AST 27  ALT 22  ALKPHOS 59  BILITOT 1.6*  PROT 5.8*  ALBUMIN 2.3*   No results for input(s): LIPASE, AMYLASE in the last  168 hours. No results for input(s): AMMONIA in the last 168 hours. Coagulation Profile: No results for input(s): INR, PROTIME in the last 168 hours. Cardiac Enzymes: No results for input(s): CKTOTAL, CKMB, CKMBINDEX, TROPONINI in the last 168 hours. BNP (last 3 results) No results for input(s): PROBNP in the last 8760 hours. HbA1C: No results for input(s): HGBA1C in the last 72 hours. CBG: No results for input(s): GLUCAP in the last 168 hours. Lipid Profile: No results for input(s): CHOL, HDL, LDLCALC, TRIG, CHOLHDL, LDLDIRECT in the last 72 hours. Thyroid Function Tests: No results for input(s): TSH, T4TOTAL, FREET4, T3FREE, THYROIDAB in the last 72 hours. Anemia Panel: No results for input(s): VITAMINB12, FOLATE, FERRITIN, TIBC, IRON, RETICCTPCT in the last 72 hours. Sepsis Labs: No results for input(s): PROCALCITON, LATICACIDVEN in the last 168 hours.  No results found for this or any previous visit (from the past 240 hour(s)).       Radiology Studies: No results found.      Scheduled Meds:   budesonide (PULMICORT) nebulizer solution  0.5 mg Nebulization BID   diltiazem  180 mg Oral Daily   folic acid  1 mg Oral Daily   ipratropium-albuterol  3 mL Nebulization BID   polyethylene glycol  17 g Oral Daily   predniSONE  20 mg Oral Q breakfast   QUEtiapine  25 mg Oral QHS   senna-docusate  1 tablet Oral BID   thiamine  100 mg Oral Daily   Continuous Infusions:  sodium chloride Stopped (12/11/19 1234)     LOS: 10 days    Time spent: 35 minutes    Tresa Moore, MD [facility] Pager [phone]  If 7PM-7AM, please contact night-coverage 12/16/2019, 10:35 AM

## 2019-12-17 NOTE — Progress Notes (Signed)
Physical Therapy Treatment Patient Details Name: William [name] MRN: [id number] DOB: [birth year] Today's Date: 12/17/2019    History of Present Illness Patient is a 64 y.o. male admitted with fall with rib pain and generalized weakness after recent falls. Patient self reports 10 falls within the past 6 months due to weakness.Patient feels he is unable to care for himself at home at this time.      PT Comments    Patient received in recliner playing games on tablet. Agrees to PT session. Reports he had a good lunch. Patient requires min guard for sit to stand from recliner. Ambulated 350 feet with RW and min guard. 1 lob due to letting go of walker to adjust mask. He required a few standing rest breaks due to fatigue and cues for safety, pacing and posture with RW. He will continue to benefit from skilled PT to improve LE strength and functional independence to return home alone.      Follow Up Recommendations  SNF     Equipment Recommendations  Rolling walker with 5" wheels    Recommendations for Other Services       Precautions / Restrictions Precautions Precautions: Fall Restrictions Weight Bearing Restrictions: No    Mobility  Bed Mobility               General bed mobility comments: Not assessed, patient in recliner and returned to recliner  Transfers Overall transfer level: Needs assistance Equipment used: Rolling walker (2 wheeled) Transfers: Sit to/from Stand Sit to Stand: Min guard;Supervision         General transfer comment: Patient able to rise from recliner with min guard and cues for hand placement  Ambulation/Gait Ambulation/Gait assistance: Min guard Gait Distance (Feet): 350 Feet Assistive device: Rolling walker (2 wheeled) Gait Pattern/deviations: Step-to pattern;Decreased stride length;Decreased step length - right;Decreased step length - left;Trunk flexed Gait velocity: decreased   General Gait Details: Patient continues to require cues for  placement of RW ( tends to push it out too far in front of him). 1 small lob with ambulation when patient released walker to adjust mask. 2-3 brief standing rest breaks required due to fatigue with ambulation. Cues needed for pacing, as he tends to start out too fast.   Stairs             Wheelchair Mobility    Modified Rankin (Stroke Patients Only)       Balance Overall balance assessment: Needs assistance Sitting-balance support: Feet supported Sitting balance-Leahy Scale: Good     Standing balance support: Bilateral upper extremity supported;During functional activity Standing balance-Leahy Scale: Fair Standing balance comment: Reliant on B UE assist for standing and dynamic activity, min guard for safety. He can stand statically without UE support and min guard.                            Cognition Arousal/Alertness: Awake/alert Behavior During Therapy: WFL for tasks assessed/performed Overall Cognitive Status: Within Functional Limits for tasks assessed                                        Exercises Other Exercises Other Exercises: seated LAQ, marching x 10 reps each, STS x 5 reps with cues to decrease UE assist.    General Comments        Pertinent Vitals/Pain Pain Assessment: No/denies pain  Home Living                      Prior Function            PT Goals (current goals can now be found in the care plan section) Acute Rehab PT Goals Patient Stated Goal: patient wants to go home, realizes he is weak PT Goal Formulation: With patient Time For Goal Achievement: 12/20/19 Potential to Achieve Goals: Fair Progress towards PT goals: Progressing toward goals    Frequency    Min 2X/week      PT Plan Current plan remains appropriate    Co-evaluation              AM-PAC PT "6 Clicks" Mobility   Outcome Measure  Help needed turning from your back to your side while in a flat bed without using  bedrails?: None Help needed moving from lying on your back to sitting on the side of a flat bed without using bedrails?: A Little Help needed moving to and from a bed to a chair (including a wheelchair)?: A Little Help needed standing up from a chair using your arms (e.g., wheelchair or bedside chair)?: A Little Help needed to walk in hospital room?: A Little Help needed climbing 3-5 steps with a railing? : A Lot 6 Click Score: 18    End of Session Equipment Utilized During Treatment: Gait belt Activity Tolerance: Patient tolerated treatment well;Patient limited by fatigue Patient left: in chair;with chair alarm set;with call bell/phone within reach Nurse Communication: Mobility status PT Visit Diagnosis: Unsteadiness on feet (R26.81);Other abnormalities of gait and mobility (R26.89);Difficulty in walking, not elsewhere classified (R26.2);Repeated falls (R29.6);Muscle weakness (generalized) (M62.81)     Time: 1410-1440 PT Time Calculation (min) (ACUTE ONLY): 30 min  Charges:  $Gait Training: 8-22 mins $Therapeutic Exercise: 8-22 mins                     Kaylanni Ezelle, PT, GCS 12/17/19,2:56 PM

## 2019-12-17 NOTE — Progress Notes (Signed)
PROGRESS NOTE    William [name]  RCV:[id number] DOB: Aug 11, 1955 DOA: 12/05/2019 PCP: [facility]   Brief Narrative:  William [name]  is a 64 y.o. male coming in with falls and weakness.  He states he has been drinking too much and smoking too much and not eating very well.  He normally walks with a walker.  Patient coughing and has shortness of breath.  Sister had him involuntarily committed couple weeks ago.  Patient refused to go to rehab at that time.  Family has noticed a decline in the patient over the past couple years  6/2: Patient seen and examined.  Seems a bit lethargic and disheveled but does answer all my questions appropriately.  Still has some cough and shortness of breath.  Alert and oriented on my evaluation.  Seen by ENT this morning.  Status post direct laryngoscopy.  No overt signs of aspiration noted on direct visualization.  No surgical intervention warranted considering risk/benefit.  Speech therapy evaluated again and was and unable to recommended diet.  6/3: Patient seen and examined.  Much calmer.  Seems more alert than yesterday.  Answers all questions appropriately.  Request to have the mittens removed.  6/4: Patient seen and examined.  Mental status much improved.  No longer agitated or pulling at lines.  Answering all questions appropriately.  Per nurse patient has not had a BM in 5 days.   6/5: Patient seen and examined.  Mental status appears to be approaching or at baseline no longer agitated.  Today was the first day he has questions about how long he been in the hospital and what exactly was wrong with him.  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  Now he had a few questions.  These were all answered to satisfaction.  Patient is currently stable for discharge at this time.  We are currently awaiting skilled nursing facility placement.  6/6: Patient seen and examined.  Mental status  improved.  Appetite also improved.  No acute distress.  Stable for discharge at this time.  Pending skilled nursing facility placement.  6/7: Patient seen and examined.  Mental status improved.  Patient again inquiring about discharge plan.  States that his sisters are coming and preparing his house for him.  I explained we are still waiting on a excepting skilled nursing facility.  Patient expressed understanding.    Assessment & Plan:   Active Problems:   Acute metabolic encephalopathy   COPD with acute exacerbation (HCC)   Acute respiratory failure with hypoxia (HCC)   Hepatic steatosis   Hypokalemia   Tachycardia  Acute exacerbation of COPD Acute hypoxic respiratory failure, secondary to above Has been weaned from supplemental oxygen Air movement and cough improving Plan: Last dose prednisone 20 mg a day Bronchodilators Pulmonary hygiene  Supplemental oxygen as needed Goal saturation 88 to 92% Incentive spirometry and flutter valve  Constipation, resolved No BM in 5 days Abdomen not tender or distended Positive bowel sounds BM reported on 12/15/19  Dysphagia CT neck noted protruding mass Felt to be osteophyte Seen by ENT status post direct laryngoscopy No signs or symptoms of aspiration Not a surgical candidate considering high risk features Speech was unfortunately unable to recommend a diet\ Seems to be tolerating dysphagia 3 diet without issue Plan: Continue dysphagia 3 diet Discussed with nurse to observe patient eating We will scale back diet if obvious signs of aspiration are noted  Acute toxic metabolic  encephalopathy, improved Patient's mental status has been waxing and waning Appears to be slowly improving Alert, oriented x3 my evaluation today Status post high-dose IV thiamine Now on oral thiamine MRI brain revealed tiny nonhemorrhagic infarcts Unlikely to be underlying patient's encephalopathy Strong suspicion for alcohol-related  encephalopathy Plan: Continue daily p.o. thiamine Nightly Seroquel As needed Haldol Frequent reorienting measures Avoid sedatives  Alcohol abuse No signs of acute withdrawal  Hepatic steatosis Outpatient follow-up, likely secondary to alcohol use  Thrombocytopenia Likely secondary alcohol use No bleeding, will continue to monitor  Weakness Functional decline Physical therapy following Recommending rehab placement  Hypokalemia Replace potassium as needed  DVT prophylaxis: Lovenox Code Status: Full Family Communication: none today Disposition Plan: Status is: Inpatient  Remains inpatient appropriate because:Unsafe d/c plan   Dispo: The patient is from: Home              Anticipated d/c is to: SNF              Anticipated d/c date is: 1 day              Patient currently is medically stable to d/c.  Patient medically ready for discharge at this time.  We are pending skilled nursing facility acceptance.  I do feel this patient has a rehab potential and his care will not be custodial nature.  He has made significant progress while admitted to [facility].  However if there are continued delays we will proceed with home with home health services.  Will discuss further with case management if no accepting facility can be found within 24 hours.  Consultants:   None  Procedures:   None  Antimicrobials:   None   Subjective: Patient seen and examined.  Mental status much improved.  Calm and appropriate.  No distress  Objective: Vitals:   12/16/19 2004 12/16/19 2047 12/17/19 0432 12/17/19 0744  BP:  (!) 144/99 (!) 141/89   Pulse:  88 80   Resp:  20 16   Temp:  98.6 F (37 C) 97.9 F (36.6 C)   TempSrc:  Oral Oral   SpO2: 96% 95% 95% 96%  Weight:      Height:        Intake/Output Summary (Last 24 hours) at 12/17/2019 1338 Last data filed at 12/17/2019 1024 Gross per 24 hour  Intake 360 ml  Output 1325 ml  Net -965 ml   Filed Weights   12/05/19 0929 12/06/19  2301  Weight: 79.4 kg 86.6 kg    Examination:  General exam: Appears calm and comfortable  Respiratory system: Diffuse crackles bilaterally.  Normal work of breathing. Cardiovascular system: S1 & S2 heard, RRR. No JVD, murmurs, rubs, gallops or clicks. No pedal edema. Gastrointestinal system: Abdomen is nondistended, soft and nontender. No organomegaly or masses felt. Normal bowel sounds heard. Central nervous system: Alert and oriented. No focal neurological deficits. Extremities: Symmetric 5 x 5 power. Skin: No rashes, lesions or ulcers Psychiatry: Judgement and insight appear normal. Mood & affect appropriate.     Data Reviewed: I have personally reviewed following labs and imaging studies  CBC: Recent Labs  Lab 12/12/19 0534 12/14/19 0819  WBC 6.7 2.6*  NEUTROABS  --  1.5*  HGB 12.6* 12.1*  HCT 38.1* 36.1*  MCV 101.9* 101.4*  PLT 110* 107*   Basic Metabolic Panel: Recent Labs  Lab 12/12/19 0534 12/14/19 0819  NA 146* 142  K 3.3* 3.2*  CL 111 105  CO2 26 29  GLUCOSE 105* 84  BUN 15 14  CREATININE 0.49* 0.58*  CALCIUM 8.1* 8.2*   GFR: Estimated Creatinine Clearance: 106.8 mL/min (A) (by C-G formula based on SCr of 0.58 mg/dL (L)). Liver Function Tests: Recent Labs  Lab 12/12/19 0534  AST 27  ALT 22  ALKPHOS 59  BILITOT 1.6*  PROT 5.8*  ALBUMIN 2.3*   No results for input(s): LIPASE, AMYLASE in the last 168 hours. No results for input(s): AMMONIA in the last 168 hours. Coagulation Profile: No results for input(s): INR, PROTIME in the last 168 hours. Cardiac Enzymes: No results for input(s): CKTOTAL, CKMB, CKMBINDEX, TROPONINI in the last 168 hours. BNP (last 3 results) No results for input(s): PROBNP in the last 8760 hours. HbA1C: No results for input(s): HGBA1C in the last 72 hours. CBG: No results for input(s): GLUCAP in the last 168 hours. Lipid Profile: No results for input(s): CHOL, HDL, LDLCALC, TRIG, CHOLHDL, LDLDIRECT in the last 72  hours. Thyroid Function Tests: No results for input(s): TSH, T4TOTAL, FREET4, T3FREE, THYROIDAB in the last 72 hours. Anemia Panel: No results for input(s): VITAMINB12, FOLATE, FERRITIN, TIBC, IRON, RETICCTPCT in the last 72 hours. Sepsis Labs: No results for input(s): PROCALCITON, LATICACIDVEN in the last 168 hours.  No results found for this or any previous visit (from the past 240 hour(s)).       Radiology Studies: No results found.      Scheduled Meds: . budesonide (PULMICORT) nebulizer solution  0.5 mg Nebulization BID  . diltiazem  180 mg Oral Daily  . folic acid  1 mg Oral Daily  . ipratropium-albuterol  3 mL Nebulization BID  . QUEtiapine  25 mg Oral QHS  . senna-docusate  1 tablet Oral BID  . thiamine  100 mg Oral Daily   Continuous Infusions: . sodium chloride Stopped (12/11/19 1234)     LOS: 11 days    Time spent: 35 minutes    Tresa Moore, MD [facility] Pager [phone]  If 7PM-7AM, please contact night-coverage 12/17/2019, 1:38 PM

## 2019-12-17 NOTE — TOC Progression Note (Addendum)
Transition of Care [facility]) - Progression Note    Patient Details  Name: William [name] MRN: [id number] Date of Birth: 02-19-1956  Transition of [facility] CM/SW Contact  Margarito Liner, LCSW Phone Number: 12/17/2019, 10:23 AM  Clinical Narrative: Left another voicemail at [facility]. Left voicemail for admissions coordinator at [facility].  Expected Discharge Plan: Skilled Nursing Facility Barriers to Discharge: Continued Medical Work up  Expected Discharge Plan and Services Expected Discharge Plan: Skilled Nursing Facility     Post Acute Care Choice: Skilled Nursing Facility Living arrangements for the past 2 months: Mobile Home                                       Social Determinants of Health (SDOH) Interventions    Readmission Risk Interventions No flowsheet data found.

## 2019-12-18 MED ORDER — ALBUTEROL SULFATE (2.5 MG/3ML) 0.083% IN NEBU: 3.0000 mL | INHALATION_SOLUTION | RESPIRATORY_TRACT | Status: DC | PRN

## 2019-12-18 MED ORDER — QUETIAPINE FUMARATE 25 MG PO TABS: 25.0000 mg | ORAL_TABLET | Freq: Every day | ORAL | 0 refills | Status: DC

## 2019-12-18 MED ORDER — DILTIAZEM HCL ER COATED BEADS 180 MG PO CP24: 180.0000 mg | ORAL_CAPSULE | Freq: Every day | ORAL | 0 refills | Status: DC

## 2019-12-18 MED ORDER — FOLIC ACID 1 MG PO TABS: 1.0000 mg | ORAL_TABLET | Freq: Every day | ORAL | 0 refills | Status: AC

## 2019-12-18 MED ORDER — ALBUTEROL SULFATE HFA 108 (90 BASE) MCG/ACT IN AERS
2.0000 | INHALATION_SPRAY | Freq: Four times a day (QID) | RESPIRATORY_TRACT | 0 refills | Status: DC | PRN
Start: 2019-12-18 — End: 2020-07-19

## 2019-12-18 MED ORDER — THIAMINE HCL 100 MG PO TABS: 100.0000 mg | ORAL_TABLET | Freq: Every day | ORAL | 0 refills | Status: AC

## 2019-12-18 NOTE — TOC Transition Note (Signed)
Transition of Care [facility]) - CM/SW Discharge Note   Patient Details  Name: William [name] MRN: [id number] Date of Birth: 03/29/1956  Transition of Care [facility]) CM/SW Contact:  Margarito Liner, LCSW Phone Number: 12/18/2019, 1:13 PM   Clinical Narrative: Patient has orders to discharge home today. Prescriptions [facility] [place]. [facility] benefits will not cover. Will send patient with GoodRx coupons. Sister Clydie Braun aware and prefers to move forward with [facility] rather than waiting two days for medications to be mailed to his home from the [place]. Paperwork to get set up with a [facility] PCP put in discharge packet as well. Asked nurse to call Clydie Braun to coordinate a time for pick up. No further concerns. CSW signing off.    Final next level of care: Home/Self Care Barriers to Discharge: Barriers Resolved   Patient Goals and CMS Choice Patient states their goals for this hospitalization and ongoing recovery are:: Go to rehab to improve strength      Discharge Placement                Patient to be transferred to facility by: Family will pick him up. Name of family member notified: [name] Patient and family notified of of transfer: 12/18/19  Discharge Plan and Services     Post Acute Care Choice: Skilled Nursing Facility                               Social Determinants of Health (SDOH) Interventions     Readmission Risk Interventions No flowsheet data found.

## 2019-12-18 NOTE — Discharge Summary (Signed)
Physician Discharge Summary  William [name] YHC:[id number] DOB: 1956/01/06 DOA: 12/05/2019  PCP: [facility]  Admit date: 12/05/2019 Discharge date: 12/18/2019  Admitted From: Home Disposition:  Home  Recommendations for Outpatient Follow-up:  1. Follow up with PCP in 1-2 weeks 2. [facility] with [facility] to [facility] care  Home Health:No Equipment/Devices:None Discharge Condition:Stable CODE STATUS:DNR Diet recommendation: Dysphagia 3 Brief/Interim Summary: RobertWolfeis a63 y.o.malecoming in with falls and weakness. He states he has been drinking too much and smoking too much and not eating very well. He normally walks with a walker. Patient coughing and has shortness of breath. Sister had him involuntarily committed couple weeks ago. Patient refused to go to rehab at that time. Family has noticed a decline in the patient over the past couple years  6/2: Patient seen and examined.  Seems a bit lethargic and disheveled but does answer all my questions appropriately.  Still has some cough and shortness of breath.  Alert and oriented on my evaluation.  Seen by ENT this morning.  Status post direct laryngoscopy.  No overt signs of aspiration noted on direct visualization.  No surgical intervention warranted considering risk/benefit.  Speech therapy evaluated again and was and unable to recommended diet.  6/3: Patient seen and examined.  Much calmer.  Seems more alert than yesterday.  Answers all questions appropriately.  Request to have the mittens removed.  6/4: Patient seen and examined.  Mental status much improved.  No longer agitated or pulling at lines.  Answering all questions appropriately.  Per nurse patient has not had a BM in 5 days.     6/5: Patient seen and examined.  Mental status appears to be approaching or at baseline no longer agitated.  Today was the first day he has questions about how long he been in the hospital and what exactly was wrong with him.  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  Now he had a few questions.  These were all answered to satisfaction.  Patient is currently stable for discharge at this time.  We are currently awaiting skilled nursing facility placement.  6/6: Patient seen and examined.  Mental status improved.  Appetite also improved.  No acute distress.  Stable for discharge at this time.  Pending skilled nursing facility placement.  6/7: Patient seen and examined.  Mental status improved.  Patient again inquiring about discharge plan.  States that his sisters are coming and preparing his house for him.  I explained we are still waiting on a excepting skilled nursing facility.  Patient expressed understanding.  6/8: Home health and skilled nursing facility were unable to be established unfortunately.  Had discussion with patient as well as with case Freight forwarder.  Patient's sisters have set up a hotel for him to live while his house is being cleared out.  I discussed with patient is comfortable for level on going home.  As he is currently stable we will discharge to home at this time.  He will reestablish care with the [place] [facility] to get a primary care doctor.  Unfortunately with no primary care doctor we are unable to set up home health.  Patient is in understanding of this.  All prescription sent to outpatient pharmacy listed in the system.  Patient discharged in stable condition.  Discharge Diagnoses:  Active Problems:   Acute metabolic encephalopathy   COPD with acute exacerbation ([place])   Acute respiratory failure with hypoxia ([place])  Hepatic steatosis   Hypokalemia   Tachycardia  Acute exacerbation of COPD Acute hypoxic respiratory failure, secondary to above Has been weaned from supplemental oxygen Air movement and cough improving Completed steroid regimen in house Saturating well on room air ProAir inhaler prescribed on discharge Follow-up  outpatient PCP   Constipation, resolved No BM in 5 days Abdomen not tender or distended Positive bowel sounds BM reported on 12/15/19  Dysphagia CT neck noted protruding mass Felt to be osteophyte Seen by ENT status post direct laryngoscopy No signs or symptoms of aspiration Not a surgical candidate considering high risk features Speech was unfortunately unable to recommend a diet\ Seems to be tolerating dysphagia 3 diet without issue Plan: Continue dysphagia 3 diet Outpatient follow-up  Acute toxic metabolic encephalopathy, improved Patient's mental status has been waxing and waning Appears to be slowly improving Alert, oriented x3 my evaluation today Status post high-dose IV thiamine Now on oral thiamine MRI brain revealed tiny nonhemorrhagic infarcts Unlikely to be underlying patient's encephalopathy Strong suspicion for alcohol-related encephalopathy Plan: Continue daily p.o. thiamine Nightly Seroquel Both his medications prescribed on discharge  Alcohol abuse No signs of acute withdrawal  Hepatic steatosis Outpatient follow-up, likely secondary to alcohol use  Thrombocytopenia Likely secondary alcohol use No bleeding, will continue to monitor  Discharge Instructions  Discharge Instructions    Diet - low sodium heart healthy   Complete by: As directed    Increase activity slowly   Complete by: As directed        No Known Allergies  Consultations:  none   Procedures/Studies: DG Chest 2 View  Result Date: 12/05/2019 CLINICAL DATA:  Fall, weakness EXAM: CHEST - 2 VIEW COMPARISON:  None. FINDINGS: Mild, likely chronic interstitial prominence related to background emphysema. Patchy atelectasis/scarring at the lung bases. No pleural effusion. No pneumothorax cardiomediastinal contours are within normal limits. No acute osseous abnormality. Chronic rib fractures IMPRESSION: No acute process in the chest Electronically Signed   By: Guadlupe Spanish M.D.    On: 12/05/2019 14:38   CT Head Wo Contrast  Result Date: 12/05/2019 CLINICAL DATA:  Fall EXAM: CT HEAD WITHOUT CONTRAST TECHNIQUE: Contiguous axial images were obtained from the base of the skull through the vertex without intravenous contrast. COMPARISON:  None. FINDINGS: Brain: There is no acute intracranial hemorrhage, mass effect, or edema. Gray-white differentiation is preserved. There is no extra-axial fluid collection. Prominence of the ventricles and sulci reflects generalized parenchymal volume loss. Patchy and confluent areas of hypoattenuation in the supratentorial white matter nonspecific but probably reflect moderate chronic microvascular ischemic changes. These findings are greater than expected for age. Vascular: No hyperdense vessel. There is mild atherosclerotic calcification at the skull base. Skull: Calvarium is unremarkable. Sinuses/Orbits: Patchy mucosal thickening.  Orbits are unremarkable. Other: None. IMPRESSION: No evidence of acute intracranial injury. Chronic/nonemergent findings detailed above. Electronically Signed   By: Guadlupe Spanish M.D.   On: 12/05/2019 14:29   CT SOFT TISSUE NECK W CONTRAST  Result Date: 12/11/2019 CLINICAL DATA:  Neck mass, nonpulsatile. Additional history provided: Possible neck mass seen on barium swallow today, history of COPD. EXAM: CT NECK WITH CONTRAST TECHNIQUE: Multidetector CT imaging of the neck was performed using the standard protocol following the bolus administration of intravenous contrast. CONTRAST:  1mL OMNIPAQUE IOHEXOL 300 MG/ML SOLN. Please note the patient's IV reportedly infiltrated during the examination. COMPARISON:  Modified barium swallow 12/11/2019, cervical spine CT 12/05/2018 FINDINGS: Mildly motion degraded examination. Additionally, the exam is somewhat limited due to a  poor contrast bolus in the setting of IV infiltration. Pharynx and larynx: Residual contrast within the oral cavity, oropharynx and visualized esophagus from  recent prior modified barium swallow. There is a 10 mm pedunculated soft tissue focus arising from the right aspect of the posterior pharynx at the level of the soft palate (series 2, image 27) (series 7, image 38). Elsewhere, there is no appreciable swelling or discrete soft tissue mass within the oral cavity, pharynx or larynx. Salivary glands: The parotid glands are atrophic. The parotid glands are unremarkable. Thyroid: Subcentimeter partially calcified left thyroid lobe nodule not meeting consensus criteria for ultrasound follow-up. Lymph nodes: No pathologically enlarged cervical chain lymph nodes. Vascular: The major vascular structures of the neck are patent. Limited intracranial: No acute abnormality identified. Visualized orbits: Largely excluded from the field of view. No abnormality visualized. Mastoids and visualized paranasal sinuses: Partial opacification of the imaged ethmoid air cells. No significant mastoid effusion. Skeleton: No acute bony abnormality or aggressive osseous lesion. Cervical spondylosis with multilevel disc space narrowing, uncovertebral and facet hypertrophy. There is a very prominent ventral osteophyte at the C3-C4 level which significantly indents the posterior aspect of the pharynx at the level of the epiglottis. This likely accounts for the abnormality identified on modified barium swallow performed earlier the same day. Degenerative fusion of the C6-C7 vertebrae. Upper chest: Emphysema. Partially imaged opacity within the dependent aspect of the right upper lobe which may reflect atelectasis or possibly sequela of aspiration. IMPRESSION: Prominent C3-C4 ventral bridging osteophyte which significantly encroaches upon the posterior aspect of the pharynx at the level of the epiglottis. This likely accounts for the abnormality identified on modified barium swallow performed earlier the same day. Nonspecific 10 mm pedunculated soft tissue focus arising from the right aspect of the  posterior pharynx at the level of the soft palate. ENT consultation and direct visualization recommended. No pathologically enlarged cervical chain lymph nodes. Incompletely imaged dependent opacity within the right upper lobe which may reflect atelectasis or possibly sequela of aspiration. Cervical spondylosis as described. Ethmoid sinusitis. Electronically Signed   By: Jackey LogeKyle  Golden DO   On: 12/11/2019 15:41   CT Cervical Spine Wo Contrast  Result Date: 12/05/2019 CLINICAL DATA:  Fall EXAM: CT CERVICAL SPINE WITHOUT CONTRAST TECHNIQUE: Multidetector CT imaging of the cervical spine was performed without intravenous contrast. Multiplanar CT image reconstructions were also generated. COMPARISON:  None. FINDINGS: Alignment: Anteroposterior alignment is maintained. Skull base and vertebrae: There is fusion across the C6-C7 disc space as well as both facets. There are large anterior osteophytes at C3-C6. The largest at C3 and C4 indent the pharynx. There is no acute fracture. Soft tissues and spinal canal: No prevertebral fluid or swelling. No visible canal hematoma. Disc levels: Multilevel degenerative changes are present without high-grade osseous encroachment on spinal canal. There is multilevel foraminal stenosis Upper chest: Emphysema. Other: None. IMPRESSION: No acute cervical spine fracture. Electronically Signed   By: Guadlupe SpanishPraneil  Patel M.D.   On: 12/05/2019 14:35   MR BRAIN WO CONTRAST  Result Date: 12/12/2019 CLINICAL DATA:  Initial evaluation for acute altered mental status. EXAM: MRI HEAD WITHOUT CONTRAST TECHNIQUE: Multiplanar, multiecho pulse sequences of the brain and surrounding structures were obtained without intravenous contrast. COMPARISON:  Prior head CT from 12/05/2019. FINDINGS: Brain: Examination moderately degraded by motion artifact. Generalized age-related cerebral atrophy. Patchy and confluent T2/FLAIR hyperintensity within the periventricular deep white matter both cerebral hemispheres  most consistent with chronic small vessel ischemic disease, moderate in nature. Punctate 5  mm acute ischemic infarcts seen involving the cortical gray matter of the high right frontal lobe (series 9, image 88). Additional 5 mm acute ischemic infarcts seen involving the subcortical anterior right frontal lobe a (series 9, image 77). Few tiny punctate acute bilateral cerebellar infarcts noted (series 9, image 62). Few punctate infarcts noted involving the cortical gray matter of the left parieto-occipital region (series 11, images 72, 74). No associated hemorrhage or mass effect. No mass lesion, midline shift, or mass effect. Mild ventricular prominence related to global parenchymal volume loss without hydrocephalus. Probable small benign arachnoid cyst noted at the left middle cranial fossa. No other extra-axial fluid collection. Pituitary gland and suprasellar region within normal limits. Midline structures intact. Vascular: Major intracranial vascular flow voids are maintained. Left cerebellar DVA noted. Skull and upper cervical spine: Craniocervical junction within normal limits. Bone marrow signal intensity normal. No scalp soft tissue abnormality. Sinuses/Orbits: Globes and orbital soft tissues within normal limits. Mild mucosal thickening noted within the ethmoidal air cells. Paranasal sinuses are otherwise clear. Small left mastoid effusion noted, of doubtful significance. Other: None. IMPRESSION: 1. Few tiny acute ischemic nonhemorrhagic infarcts involving the bilateral cerebral and cerebellar hemispheres as above. No associated hemorrhage or mass effect. 2. Underlying age-related cerebral atrophy with moderate chronic small vessel ischemic disease. Electronically Signed   By: Rise Mu M.D.   On: 12/12/2019 03:12   DG Chest Port 1 View  Result Date: 12/08/2019 CLINICAL DATA:  Respiratory distress EXAM: PORTABLE CHEST 1 VIEW COMPARISON:  12/05/2019 FINDINGS: The heart size and mediastinal  contours are within normal limits. Both lungs are clear. The visualized skeletal structures are unremarkable. IMPRESSION: No active disease. Electronically Signed   By: Deatra Robinson M.D.   On: 12/08/2019 01:09   US Abdomen Limited RUQ  Result Date: 12/08/2019 CLINICAL DATA:  Alcohol abuse EXAM: ULTRASOUND ABDOMEN LIMITED RIGHT UPPER QUADRANT COMPARISON:  None. FINDINGS: Gallbladder: No gallstones or wall thickening visualized. No sonographic Murphy sign noted by sonographer. Common bile duct: Diameter: 3.1 mm Liver: Diffuse increased echogenicity throughout the liver without focal mass. Portal vein is patent on color Doppler imaging with normal direction of blood flow towards the liver. Other: None. IMPRESSION: 1. Diffuse increased echogenicity throughout the liver is nonspecific but often seen with hepatic steatosis. No other abnormalities. Electronically Signed   By: Gerome Sam III M.D   On: 12/08/2019 14:29    (Echo, Carotid, EGD, Colonoscopy, ERCP)    Subjective: Patient seen and examined.  On the day of discharge.  No distress.  Understands that we are going to send him back home.  He is in agreement.  Discharge Exam: Vitals:   12/18/19 0845 12/18/19 1250  BP: 110/78 112/70  Pulse:  93  Resp:  20  Temp:  97.7 F (36.5 C)  SpO2:  97%   Vitals:   12/17/19 2250 12/18/19 0507 12/18/19 0845 12/18/19 1250  BP:  106/82 110/78 112/70  Pulse:  87  93  Resp:  20  20  Temp: 97.7 F (36.5 C) (!) 97.5 F (36.4 C)  97.7 F (36.5 C)  TempSrc:  Oral  Oral  SpO2:  94%  97%  Weight:      Height:        General: Pt is alert, awake, not in acute distress Cardiovascular: RRR, S1/S2 +, no rubs, no gallops Respiratory: CTA bilaterally, no wheezing, no rhonchi Abdominal: Soft, NT, ND, bowel sounds + Extremities: no edema, no cyanosis    The results of  significant diagnostics from this hospitalization (including imaging, microbiology, ancillary and laboratory) are listed below for  reference.     Microbiology: No results found for this or any previous visit (from the past 240 hour(s)).   Labs: BNP (last 3 results) No results for input(s): BNP in the last 8760 hours. Basic Metabolic Panel: Recent Labs  Lab 12/12/19 0534 12/14/19 0819  NA 146* 142  K 3.3* 3.2*  CL 111 105  CO2 26 29  GLUCOSE 105* 84  BUN 15 14  CREATININE 0.49* 0.58*  CALCIUM 8.1* 8.2*   Liver Function Tests: Recent Labs  Lab 12/12/19 0534  AST 27  ALT 22  ALKPHOS 59  BILITOT 1.6*  PROT 5.8*  ALBUMIN 2.3*   No results for input(s): LIPASE, AMYLASE in the last 168 hours. No results for input(s): AMMONIA in the last 168 hours. CBC: Recent Labs  Lab 12/12/19 0534 12/14/19 0819  WBC 6.7 2.6*  NEUTROABS  --  1.5*  HGB 12.6* 12.1*  HCT 38.1* 36.1*  MCV 101.9* 101.4*  PLT 110* 107*   Cardiac Enzymes: No results for input(s): CKTOTAL, CKMB, CKMBINDEX, TROPONINI in the last 168 hours. BNP: Invalid input(s): POCBNP CBG: No results for input(s): GLUCAP in the last 168 hours. D-Dimer No results for input(s): DDIMER in the last 72 hours. Hgb A1c No results for input(s): HGBA1C in the last 72 hours. Lipid Profile No results for input(s): CHOL, HDL, LDLCALC, TRIG, CHOLHDL, LDLDIRECT in the last 72 hours. Thyroid function studies No results for input(s): TSH, T4TOTAL, T3FREE, THYROIDAB in the last 72 hours.  Invalid input(s): FREET3 Anemia work up No results for input(s): VITAMINB12, FOLATE, FERRITIN, TIBC, IRON, RETICCTPCT in the last 72 hours. Urinalysis    Component Value Date/Time   COLORURINE AMBER (A) 12/06/2019 0407   APPEARANCEUR HAZY (A) 12/06/2019 0407   LABSPEC 1.021 12/06/2019 0407   PHURINE 5.0 12/06/2019 0407   GLUCOSEU NEGATIVE 12/06/2019 0407   HGBUR NEGATIVE 12/06/2019 0407   BILIRUBINUR NEGATIVE 12/06/2019 0407   KETONESUR NEGATIVE 12/06/2019 0407   PROTEINUR NEGATIVE 12/06/2019 0407   NITRITE NEGATIVE 12/06/2019 0407   LEUKOCYTESUR NEGATIVE  12/06/2019 0407   Sepsis Labs Invalid input(s): PROCALCITONIN,  WBC,  LACTICIDVEN Microbiology No results found for this or any previous visit (from the past 240 hour(s)).   Time coordinating discharge: Over 30 minutes  SIGNED:   Tresa Moore, MD  [facility] 12/18/2019, 1:08 PM Pager   If 7PM-7AM, please contact night-coverage
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
## 2019-12-18 NOTE — Progress Notes (Signed)
William [name] and O x4. VSS. Pt tolerating diet well. No complaints of nausea or vomiting. Prescriptions given. Pt voices understanding of discharge instructions with no further questions. Patient discharged via wheelchair with NT   Vitals:   12/18/19 0845 12/18/19 1250  BP: 110/78 112/70  Pulse:  93  Resp:  20  Temp:  97.7 F (36.5 C)  SpO2:  97%    William [name]

## 2020-01-21 ENCOUNTER — Emergency Department
Admission: EM | Admit: 2020-01-21 | Discharge: 2020-01-21 | Disposition: A | Discharge status: Home / Self Care | Payer: No Typology Code available for payment source | Attending: Emergency Medicine | Admitting: Emergency Medicine

## 2020-01-21 ENCOUNTER — Encounter: Payer: Self-pay | Admitting: Emergency Medicine

## 2020-01-21 ENCOUNTER — Other Ambulatory Visit: Payer: Self-pay

## 2020-01-21 DIAGNOSIS — Y939 Activity, unspecified: Secondary | ICD-10-CM | POA: Insufficient documentation

## 2020-01-21 DIAGNOSIS — S79911A Unspecified injury of right hip, initial encounter: Secondary | ICD-10-CM | POA: Diagnosis present

## 2020-01-21 DIAGNOSIS — Y999 Unspecified external cause status: Secondary | ICD-10-CM | POA: Insufficient documentation

## 2020-01-21 DIAGNOSIS — F1020 Alcohol dependence, uncomplicated: Secondary | ICD-10-CM

## 2020-01-21 DIAGNOSIS — X58XXXA Exposure to other specified factors, initial encounter: Secondary | ICD-10-CM | POA: Insufficient documentation

## 2020-01-21 DIAGNOSIS — Y929 Unspecified place or not applicable: Secondary | ICD-10-CM | POA: Insufficient documentation

## 2020-01-21 DIAGNOSIS — F17210 Nicotine dependence, cigarettes, uncomplicated: Secondary | ICD-10-CM | POA: Diagnosis not present

## 2020-01-21 DIAGNOSIS — J441 Chronic obstructive pulmonary disease with (acute) exacerbation: Secondary | ICD-10-CM | POA: Insufficient documentation

## 2020-01-21 DIAGNOSIS — Z79899 Other long term (current) drug therapy: Secondary | ICD-10-CM | POA: Diagnosis not present

## 2020-01-21 DIAGNOSIS — S7001XA Contusion of right hip, initial encounter: Secondary | ICD-10-CM

## 2020-01-21 LAB — COMPREHENSIVE METABOLIC PANEL
ALT: 14 U/L (ref 0–44)
AST: 38 U/L (ref 15–41)
Albumin: 3.1 g/dL — ABNORMAL LOW (ref 3.5–5.0)
Alkaline Phosphatase: 136 U/L — ABNORMAL HIGH (ref 38–126)
Anion gap: 11 (ref 5–15)
BUN: <5 mg/dL — ABNORMAL LOW (ref 8–23)
CO2: 27 mmol/L (ref 22–32)
Calcium: 8.4 mg/dL — ABNORMAL LOW (ref 8.9–10.3)
Chloride: 97 mmol/L — ABNORMAL LOW (ref 98–111)
Creatinine, Ser: 0.73 mg/dL (ref 0.61–1.24)
GFR calc Af Amer: >60 mL/min (ref >60)
GFR calc non Af Amer: >60 mL/min (ref >60)
Glucose, Bld: 97 mg/dL (ref 70–99)
Potassium: 3.9 mmol/L (ref 3.5–5.1)
Sodium: 135 mmol/L (ref 135–145)
Total Bilirubin: 1.2 mg/dL (ref 0.3–1.2)
Total Protein: 6.2 g/dL — ABNORMAL LOW (ref 6.5–8.1)

## 2020-01-21 LAB — CBC
HCT: 42.9 % (ref 39.0–52.0)
Hemoglobin: 13.9 g/dL (ref 13.0–17.0)
MCH: 31.7 pg (ref 26.0–34.0)
MCHC: 32.4 g/dL (ref 30.0–36.0)
MCV: 97.9 fL (ref 80.0–100.0)
Platelets: 132 10*3/uL — ABNORMAL LOW (ref 150–400)
RBC: 4.38 MIL/uL (ref 4.22–5.81)
RDW: 15.1 % (ref 11.5–15.5)
WBC: 4 10*3/uL (ref 4.0–10.5)
nRBC: 0 % (ref 0.0–0.2)

## 2020-01-21 LAB — ETHANOL: Alcohol, Ethyl (B): 193 mg/dL — ABNORMAL HIGH (ref <10)

## 2020-01-21 NOTE — ED Triage Notes (Signed)
First Nurse Note:  Arrives via [facility] for c/o right upper leg pain.  Per report, patient is intoxicated and has been for several days.  Larey Seat two days ago and c/o leg pain.  Patient ambulatory on EMS arrival.  NAD

## 2020-01-21 NOTE — ED Triage Notes (Addendum)
Pain to right buttockis.  Pt was admitted for wernicke's and falls recently but has fallen since.  Would also like help with detox.  Has drank five 24 ounce beers today.  NAD. Is oriented to place, person, and month currently.

## 2020-01-21 NOTE — ED Provider Notes (Signed)
[facility] Emergency Department Provider Note  ____________________________________________  Time seen: Approximately 10:13 PM  I have reviewed the triage vital signs and the nursing notes.   HISTORY  Chief Complaint Hip Pain    HPI William [name] is a 64 y.o. male with a history of alcohol abuse, COPD, Warnicke's encephalopathy who comes the ED today complaining of right upper leg pain has been going on for the past 3 or 4 days.  He does note that he fell a few days ago and was laying on the ground for about an hour at home.  He denies head injury or loss of consciousness.  He has been able to walk on it, it is nonradiating, no aggravating or alleviating factors.  Reports that after his recent hospitalization he has cut his drinking in half and he feels much better.  He is eating regularly.  His sisters have cleaned out his house for him and made his living situation much better.  Denies any other acute complaints.  Not interested in substance abuse treatment at this time.      Past Medical History:  Diagnosis Date  . Alcohol abuse   . Tobacco abuse      Patient Active Problem List   Diagnosis Date Noted  . Tachycardia   . Hypokalemia   . Hepatic steatosis   . Acute respiratory failure with hypoxia (HCC)   . COPD with acute exacerbation (HCC)   . Acute metabolic encephalopathy 12/06/2019  . Wernicke's encephalopathy   . Wheeze   . Alcohol abuse   . Tobacco abuse   . Thrombocytopenia (HCC)   . Weakness      History reviewed. No pertinent surgical history.   Prior to Admission medications   Medication Sig Start Date End Date Taking? Authorizing Provider  albuterol (VENTOLIN HFA) 108 (90 Base) MCG/ACT inhaler Inhale 2 puffs into the lungs every 6 (six) hours as needed for wheezing or shortness of breath. 12/18/19   Tresa Moore, MD  diltiazem (CARDIZEM CD) 180 MG 24 hr capsule Take 1 capsule (180 mg total) by mouth daily. 12/19/19 01/18/20   Tresa Moore, MD  QUEtiapine (SEROQUEL) 25 MG tablet Take 1 tablet (25 mg total) by mouth at bedtime. 12/18/19 01/17/20  Tresa Moore, MD     Allergies Patient has no known allergies.   Family History  Problem Relation Age of Onset  . CAD Father     Social History Social History   Tobacco Use  . Smoking status: Current Every Day Smoker    Packs/day: 2.00    Types: Cigarettes  . Smokeless tobacco: Never Used  Substance Use Topics  . Alcohol use: Yes    Alcohol/week: 18.0 standard drinks    Types: 18 Cans of beer per week    Comment: everyday  . Drug use: Not on file    Review of Systems  Constitutional:   No fever or chills.  ENT:   No sore throat. No rhinorrhea. Cardiovascular:   No chest pain or syncope. Respiratory:   No dyspnea or cough. Gastrointestinal:   Negative for abdominal pain, vomiting and diarrhea.  Musculoskeletal: Right hip pain as above All other systems reviewed and are negative except as documented above in ROS and HPI.  ____________________________________________   PHYSICAL EXAM:  VITAL SIGNS: ED Triage Vitals  Enc Vitals Group     BP 01/21/20 1837 112/85     Pulse Rate 01/21/20 1837 95     Resp 01/21/20 1837  20     Temp 01/21/20 1837 98.1 F (36.7 C)     Temp Source 01/21/20 1837 Oral     SpO2 01/21/20 1837 97 %     Weight 01/21/20 1838 180 lb (81.6 kg)     Height 01/21/20 1838 6\' 1"  (1.854 m)     Head Circumference --      Peak Flow --      Pain Score 01/21/20 1847 0     Pain Loc --      Pain Edu? --      Excl. in GC? --     Vital signs reviewed, nursing assessments reviewed.   Constitutional:   Alert and oriented. Non-toxic appearance. Eyes:   Conjunctivae are normal. EOMI. PERRL. ENT      Head:   Normocephalic and atraumatic.      Nose:   Normal.      Mouth/Throat: Moist mucous membranes      Neck:   No meningismus. Full ROM. Hematological/Lymphatic/Immunilogical:   No cervical  lymphadenopathy. Cardiovascular:   RRR. Symmetric bilateral radial and DP pulses.  No murmurs. Cap refill less than 2 seconds. Respiratory:   Normal respiratory effort without tachypnea/retractions. Breath sounds are clear and equal bilaterally. No wheezes/rales/rhonchi. Gastrointestinal:   Soft and nontender. Non distended. There is no CVA tenderness.  No rebound, rigidity, or guarding.  Musculoskeletal:   Normal range of motion in all extremities. No joint effusions.  No lower extremity tenderness.  No edema.  Ambulatory with steady gait without pain Neurologic:   Normal speech and language.  Motor grossly intact.  Steady gait.  Normal cerebellar function No acute focal neurologic deficits are appreciated.  Skin:    Skin is warm, dry and intact. No rash noted.  No petechiae, purpura, or bullae.  Right posterior hip has a 3 cm area of skin contusion at the site of pain consistent with a stage I pressure ulcer, no signs of infection.  No open wound.  ____________________________________________    LABS (pertinent positives/negatives) (all labs ordered are listed, but only abnormal results are displayed) Labs Reviewed  COMPREHENSIVE METABOLIC PANEL - Abnormal; Notable for the following components:      Result Value   Chloride 97 (*)    BUN <5 (*)    Calcium 8.4 (*)    Total Protein 6.2 (*)    Albumin 3.1 (*)    Alkaline Phosphatase 136 (*)    All other components within normal limits  ETHANOL - Abnormal; Notable for the following components:   Alcohol, Ethyl (B) 193 (*)    All other components within normal limits  CBC - Abnormal; Notable for the following components:   Platelets 132 (*)    All other components within normal limits  URINE DRUG SCREEN, QUALITATIVE ([facility] ONLY)   ____________________________________________   EKG    ____________________________________________    RADIOLOGY  No results  found.  ____________________________________________   PROCEDURES Procedures  ____________________________________________    CLINICAL IMPRESSION / ASSESSMENT AND PLAN / ED COURSE  Medications ordered in the ED: Medications - No data to display  Pertinent labs & imaging results that were available during my care of the patient were reviewed by me and considered in my medical decision making (see chart for details).  Kwesi Sangha was evaluated in Emergency Department on 01/21/2020 for the symptoms described in the history of present illness. He was evaluated in the context of the global COVID-19 pandemic, which necessitated consideration that the patient might be at  risk for infection with the SARS-CoV-2 virus that causes COVID-19. Institutional protocols and algorithms that pertain to the evaluation of patients at risk for COVID-19 are in a state of rapid change based on information released by regulatory bodies including the CDC and federal and state organizations. These policies and algorithms were followed during the patient's care in the ED.   Patient presents with right hip pain which appears to be due to contusion from recent fall.  He is ambulatory, neurologically intact, normal vital signs.  Alcohol level is 190 but he is clinically sober.  He is not interested in substance abuse treatment at this time.  Stable for discharge home.  Will provide resources for substance abuse treatment.  Clinical Course as of Jan 21 2212  Mon Jan 21, 2020  2210 Patient is ambulatory, nontoxic, normal mental status currently.  Stable for discharge.  Unable to provide a taxi voucher for the patient [place].   [PS]    Clinical Course User Index [PS] Sharman Cheek, MD     ____________________________________________   FINAL CLINICAL IMPRESSION(S) / ED DIAGNOSES    Final diagnoses:  Uncomplicated alcohol dependence (HCC)  Contusion of right hip, initial  encounter     ED Discharge Orders    None      Portions of this note were generated with dragon dictation software. Dictation errors may occur despite best attempts at proofreading.   Sharman Cheek, MD 01/21/20 2216

## 2020-07-17 ENCOUNTER — Inpatient Hospital Stay
Admission: EM | Admit: 2020-07-17 | Discharge: 2020-08-12 | DRG: 871 | Disposition: E | Payer: No Typology Code available for payment source | Attending: Internal Medicine | Admitting: Internal Medicine

## 2020-07-17 ENCOUNTER — Emergency Department: Payer: No Typology Code available for payment source

## 2020-07-17 ENCOUNTER — Other Ambulatory Visit: Payer: Self-pay

## 2020-07-17 DIAGNOSIS — J189 Pneumonia, unspecified organism: Secondary | ICD-10-CM | POA: Diagnosis present

## 2020-07-17 DIAGNOSIS — Z515 Encounter for palliative care: Secondary | ICD-10-CM

## 2020-07-17 DIAGNOSIS — R54 Age-related physical debility: Secondary | ICD-10-CM | POA: Diagnosis present

## 2020-07-17 DIAGNOSIS — R64 Cachexia: Secondary | ICD-10-CM | POA: Diagnosis present

## 2020-07-17 DIAGNOSIS — D6959 Other secondary thrombocytopenia: Secondary | ICD-10-CM | POA: Diagnosis present

## 2020-07-17 DIAGNOSIS — Z720 Tobacco use: Secondary | ICD-10-CM | POA: Diagnosis present

## 2020-07-17 DIAGNOSIS — Z8249 Family history of ischemic heart disease and other diseases of the circulatory system: Secondary | ICD-10-CM

## 2020-07-17 DIAGNOSIS — E860 Dehydration: Secondary | ICD-10-CM | POA: Diagnosis present

## 2020-07-17 DIAGNOSIS — D696 Thrombocytopenia, unspecified: Secondary | ICD-10-CM | POA: Diagnosis present

## 2020-07-17 DIAGNOSIS — Z6823 Body mass index (BMI) 23.0-23.9, adult: Secondary | ICD-10-CM

## 2020-07-17 DIAGNOSIS — A4189 Other specified sepsis: Principal | ICD-10-CM | POA: Diagnosis present

## 2020-07-17 DIAGNOSIS — R531 Weakness: Secondary | ICD-10-CM

## 2020-07-17 DIAGNOSIS — I959 Hypotension, unspecified: Secondary | ICD-10-CM | POA: Diagnosis not present

## 2020-07-17 DIAGNOSIS — R296 Repeated falls: Secondary | ICD-10-CM | POA: Diagnosis present

## 2020-07-17 DIAGNOSIS — F10230 Alcohol dependence with withdrawal, uncomplicated: Secondary | ICD-10-CM | POA: Diagnosis not present

## 2020-07-17 DIAGNOSIS — Z66 Do not resuscitate: Secondary | ICD-10-CM | POA: Diagnosis present

## 2020-07-17 DIAGNOSIS — F10239 Alcohol dependence with withdrawal, unspecified: Secondary | ICD-10-CM | POA: Diagnosis present

## 2020-07-17 DIAGNOSIS — U071 COVID-19: Secondary | ICD-10-CM | POA: Diagnosis present

## 2020-07-17 DIAGNOSIS — R6520 Severe sepsis without septic shock: Secondary | ICD-10-CM | POA: Diagnosis present

## 2020-07-17 DIAGNOSIS — F10939 Alcohol use, unspecified with withdrawal, unspecified: Secondary | ICD-10-CM | POA: Diagnosis present

## 2020-07-17 DIAGNOSIS — G312 Degeneration of nervous system due to alcohol: Secondary | ICD-10-CM | POA: Diagnosis present

## 2020-07-17 DIAGNOSIS — J690 Pneumonitis due to inhalation of food and vomit: Secondary | ICD-10-CM | POA: Diagnosis present

## 2020-07-17 DIAGNOSIS — R1312 Dysphagia, oropharyngeal phase: Secondary | ICD-10-CM | POA: Diagnosis present

## 2020-07-17 DIAGNOSIS — F17210 Nicotine dependence, cigarettes, uncomplicated: Secondary | ICD-10-CM | POA: Diagnosis present

## 2020-07-17 DIAGNOSIS — J440 Chronic obstructive pulmonary disease with acute lower respiratory infection: Secondary | ICD-10-CM | POA: Diagnosis present

## 2020-07-17 DIAGNOSIS — I480 Paroxysmal atrial fibrillation: Secondary | ICD-10-CM | POA: Diagnosis not present

## 2020-07-17 DIAGNOSIS — D72819 Decreased white blood cell count, unspecified: Secondary | ICD-10-CM | POA: Diagnosis present

## 2020-07-17 DIAGNOSIS — J9601 Acute respiratory failure with hypoxia: Secondary | ICD-10-CM | POA: Diagnosis not present

## 2020-07-17 DIAGNOSIS — J1282 Pneumonia due to coronavirus disease 2019: Secondary | ICD-10-CM | POA: Diagnosis present

## 2020-07-17 DIAGNOSIS — Z9181 History of falling: Secondary | ICD-10-CM

## 2020-07-17 DIAGNOSIS — Z79899 Other long term (current) drug therapy: Secondary | ICD-10-CM

## 2020-07-17 DIAGNOSIS — F10930 Alcohol use, unspecified with withdrawal, uncomplicated: Secondary | ICD-10-CM

## 2020-07-17 DIAGNOSIS — E876 Hypokalemia: Secondary | ICD-10-CM | POA: Diagnosis present

## 2020-07-17 DIAGNOSIS — E871 Hypo-osmolality and hyponatremia: Secondary | ICD-10-CM | POA: Diagnosis present

## 2020-07-17 LAB — URINALYSIS, COMPLETE (UACMP) WITH MICROSCOPIC
Bacteria, UA: NONE SEEN
Bilirubin Urine: NEGATIVE
Glucose, UA: NEGATIVE mg/dL
Hgb urine dipstick: NEGATIVE
Ketones, ur: 20 mg/dL — AB
Leukocytes,Ua: NEGATIVE
Nitrite: NEGATIVE
Protein, ur: NEGATIVE mg/dL
Specific Gravity, Urine: 1.017 (ref 1.005–1.030)
Squamous Epithelial / HPF: NONE SEEN (ref 0–5)
pH: 5 (ref 5.0–8.0)

## 2020-07-17 LAB — CBC
HCT: 39.9 % (ref 39.0–52.0)
Hemoglobin: 14 g/dL (ref 13.0–17.0)
MCH: 35.1 pg — ABNORMAL HIGH (ref 26.0–34.0)
MCHC: 35.1 g/dL (ref 30.0–36.0)
MCV: 100 fL (ref 80.0–100.0)
Platelets: 95 10*3/uL — ABNORMAL LOW (ref 150–400)
RBC: 3.99 MIL/uL — ABNORMAL LOW (ref 4.22–5.81)
RDW: 12.2 % (ref 11.5–15.5)
WBC: 2.2 10*3/uL — ABNORMAL LOW (ref 4.0–10.5)
nRBC: 0 % (ref 0.0–0.2)

## 2020-07-17 LAB — BASIC METABOLIC PANEL
Anion gap: 13 (ref 5–15)
BUN: 6 mg/dL — ABNORMAL LOW (ref 8–23)
CO2: 23 mmol/L (ref 22–32)
Calcium: 8.5 mg/dL — ABNORMAL LOW (ref 8.9–10.3)
Chloride: 96 mmol/L — ABNORMAL LOW (ref 98–111)
Creatinine, Ser: 0.57 mg/dL — ABNORMAL LOW (ref 0.61–1.24)
GFR, Estimated: >60 mL/min (ref >60)
Glucose, Bld: 117 mg/dL — ABNORMAL HIGH (ref 70–99)
Potassium: 3.6 mmol/L (ref 3.5–5.1)
Sodium: 132 mmol/L — ABNORMAL LOW (ref 135–145)

## 2020-07-17 LAB — CK: Total CK: 188 U/L (ref 49–397)

## 2020-07-17 LAB — TROPONIN I (HIGH SENSITIVITY)
Troponin I (High Sensitivity): 10 ng/L (ref <18)
Troponin I (High Sensitivity): 11 ng/L (ref <18)

## 2020-07-17 LAB — ETHANOL: Alcohol, Ethyl (B): <10 mg/dL (ref <10)

## 2020-07-17 LAB — LACTIC ACID, PLASMA: Lactic Acid, Venous: 1.4 mmol/L (ref 0.5–1.9)

## 2020-07-17 LAB — BRAIN NATRIURETIC PEPTIDE: B Natriuretic Peptide: 123.6 pg/mL — ABNORMAL HIGH (ref 0.0–100.0)

## 2020-07-17 MED ORDER — THIAMINE HCL 100 MG/ML IJ SOLN
100.0000 mg | Freq: Once | INTRAMUSCULAR | Status: AC
  Administered 2020-07-18: 100 mg via INTRAVENOUS
  Filled 2020-07-17: qty 2

## 2020-07-17 MED ORDER — THIAMINE HCL 100 MG/ML IJ SOLN
100.0000 mg | Freq: Every day | INTRAMUSCULAR | Status: DC
  Administered 2020-07-18 – 2020-07-27 (×9): 100 mg via INTRAVENOUS
  Filled 2020-07-17 (×10): qty 2

## 2020-07-17 MED ORDER — THIAMINE HCL 100 MG PO TABS: 100.0000 mg | ORAL_TABLET | Freq: Every day | ORAL | Status: DC

## 2020-07-17 MED ORDER — SODIUM CHLORIDE 0.9 % IV SOLN
500.0000 mg | INTRAVENOUS | Status: DC
  Administered 2020-07-18 – 2020-07-19 (×3): 500 mg via INTRAVENOUS
  Filled 2020-07-17 (×3): qty 500

## 2020-07-17 MED ORDER — SODIUM CHLORIDE 0.9 % IV BOLUS
1000.0000 mL | Freq: Once | INTRAVENOUS | Status: AC
  Administered 2020-07-17: 1000 mL via INTRAVENOUS

## 2020-07-17 MED ORDER — LORAZEPAM 2 MG/ML IJ SOLN: 0.0000 mg | Freq: Three times a day (TID) | INTRAMUSCULAR | Status: AC

## 2020-07-17 MED ORDER — LORAZEPAM 2 MG/ML IJ SOLN
2.0000 mg | Freq: Once | INTRAMUSCULAR | Status: AC
  Administered 2020-07-17: 2 mg via INTRAVENOUS
  Filled 2020-07-17: qty 1

## 2020-07-17 MED ORDER — LORAZEPAM 2 MG/ML IJ SOLN
0.0000 mg | INTRAMUSCULAR | Status: AC
  Administered 2020-07-18: 0.5 mg via INTRAVENOUS
  Administered 2020-07-18 – 2020-07-19 (×3): 1 mg via INTRAVENOUS
  Filled 2020-07-17 (×4): qty 1

## 2020-07-17 MED ORDER — LORAZEPAM 1 MG PO TABS: 1.0000 mg | ORAL_TABLET | ORAL | Status: AC | PRN

## 2020-07-17 MED ORDER — SODIUM CHLORIDE 0.9 % IV SOLN
2.0000 g | INTRAVENOUS | Status: DC
  Administered 2020-07-18 – 2020-07-19 (×3): 2 g via INTRAVENOUS
  Filled 2020-07-17 (×4): qty 20

## 2020-07-17 MED ORDER — MAGNESIUM SULFATE 2 GM/50ML IV SOLN
2.0000 g | Freq: Once | INTRAVENOUS | Status: AC
  Administered 2020-07-18: 2 g via INTRAVENOUS
  Filled 2020-07-17: qty 50

## 2020-07-17 MED ORDER — ONDANSETRON HCL 4 MG/2ML IJ SOLN: 4.0000 mg | Freq: Four times a day (QID) | INTRAMUSCULAR | Status: DC | PRN

## 2020-07-17 MED ORDER — FOLIC ACID 1 MG PO TABS: 1.0000 mg | ORAL_TABLET | Freq: Every day | ORAL | Status: DC

## 2020-07-17 MED ORDER — ONDANSETRON HCL 4 MG PO TABS
4.0000 mg | ORAL_TABLET | Freq: Four times a day (QID) | ORAL | Status: DC | PRN
  Filled 2020-07-17: qty 1

## 2020-07-17 MED ORDER — POTASSIUM CHLORIDE IN NACL 20-0.9 MEQ/L-% IV SOLN: INTRAVENOUS | Status: DC

## 2020-07-17 MED ORDER — ACETAMINOPHEN 650 MG RE SUPP: 650.0000 mg | Freq: Four times a day (QID) | RECTAL | Status: DC | PRN

## 2020-07-17 MED ORDER — LORAZEPAM 2 MG/ML IJ SOLN
1.0000 mg | INTRAMUSCULAR | Status: AC | PRN
  Filled 2020-07-17 (×2): qty 1

## 2020-07-17 MED ORDER — ADULT MULTIVITAMIN W/MINERALS CH: 1.0000 | ORAL_TABLET | Freq: Every day | ORAL | Status: DC

## 2020-07-17 MED ORDER — ACETAMINOPHEN 325 MG PO TABS
650.0000 mg | ORAL_TABLET | Freq: Four times a day (QID) | ORAL | Status: DC | PRN
  Filled 2020-07-17: qty 2

## 2020-07-17 MED ORDER — SODIUM CHLORIDE 0.9 % IV BOLUS
1000.0000 mL | Freq: Once | INTRAVENOUS | Status: AC
  Administered 2020-07-18: 1000 mL via INTRAVENOUS

## 2020-07-17 NOTE — ED Notes (Signed)
Patient transported to CT 

## 2020-07-17 NOTE — ED Notes (Signed)
Pt cleaned upon arrival. Clean brief and chux in place.

## 2020-07-17 NOTE — H&P (Signed)
History and Physical    Cloud Graham CVE:[id number] DOB: 07/03/1956 DOA: August 12, 2020  PCP: [facility]   Patient coming from: Home.  I have personally briefly reviewed patient's old medical records in [place]  Chief Complaint: Fall.  HPI: William [name] is a 65 y.o. male with medical history significant of Warnicke encephalopathy, hypertension, alcohol and tobacco abuse who is brought to the emergency department via EMS after sustaining an unwitnessed fall at home last night with inability to stand up afterwards and remained on the floors for about 12 hours.  He was found by his sister who called EMS.  The patient has a history of frequent falls and chronic alcohol use (about 18 cans of beer daily).  He denies losing consciousness and complains of back pain.  He is oriented to time and place, partially oriented to situation and disoriented to time and date. He denies headache, chest pain, back or abdominal pain at this time.  ED Course: Initial vital signs were:  98.5 F (36.9 C) 120Abnormal -- 18 124/85 Lying 95 % Room   The patient was given a 1000 mL bolus, 2 mg of lorazepam IVP, started on ceftriaxone and a azithromycin in the emergency department.  Labwork: Urinalysis showed ketonuria 20 mg/dL, was otherwise unremarkable. CBC showed a white count of 2.2, hemoglobin 14.0 g/dL platelets 95. BNP was 123.6 pg/mL. Total CK is 180 units/L. Sodium 132, potassium 3.6, chloride 96 and CO2 23 mmol/L. Glucose under 17, BUN 6, creatinine 0.57 and calcium 8.5 mg/dL. Total protein 6.4, albumin 2.9 g/dL. AST was 55, normal ALT and alkaline phosphatase. Total bilirubin was 1.6 and direct bilirubin 0.4 mg/dL.  Imaging: A portable chest radiograph show slightly increased right basilar opacity/question atelectasis or airspace disease. There with chronic interstitial opacities. CT head without contrast did not show any acute intracranial process. There is stable chronic small vessel ischemic  changes and cerebral atrophy. There is a chronic appearing lacunar infarct. Please see images and full regular report for further detail.  Review of Systems: As per HPI otherwise all other systems reviewed and are negative.  Past Medical History:  Diagnosis Date   Alcohol abuse    Tobacco abuse    History reviewed. No pertinent surgical history.  Social History  reports that he has been smoking cigarettes. He has been smoking about 2.00 packs per day. He has never used smokeless tobacco. He reports current alcohol use of about 18.0 standard drinks of alcohol per week. No history on file for drug use.  No Known Allergies  Family History  Problem Relation Age of Onset   CAD Father    Prior to Admission medications   Medication Sig Start Date End Date Taking? Authorizing Provider  albuterol (VENTOLIN HFA) 108 (90 Base) MCG/ACT inhaler Inhale 2 puffs into the lungs every 6 (six) hours as needed for wheezing or shortness of breath. 12/18/19   Sidney Ace, MD  diltiazem (CARDIZEM CD) 180 MG 24 hr capsule Take 1 capsule (180 mg total) by mouth daily. 12/19/19 01/18/20  Sidney Ace, MD  QUEtiapine (SEROQUEL) 25 MG tablet Take 1 tablet (25 mg total) by mouth at bedtime. 12/18/19 01/17/20  Sidney Ace, MD    Physical Exam: Vitals:   August 12, 2020 1629 08-12-2020 2045  BP: 124/85 111/85  Pulse: (!) 120 (!) 110  Resp: 18 18  Temp: 98.5 F (36.9 C)   TempSrc: Oral   SpO2: 95% 97%    Constitutional: Looks chronically ill. Eyes: PERRL,  lids and conjunctivae injected. ENMT: Mucous membranes are dry.. Posterior pharynx clear of any exudate or lesions. Neck: normal, supple, no masses, no thyromegaly Respiratory: Bilateral rhonchi, wheezing and scattered crackles. Normal respiratory effort. No accessory muscle use.  Cardiovascular: Tachycardic with a regular rhythm at 108 bpm, no murmurs / rubs / gallops. No extremity edema. 2+ pedal pulses. No carotid bruits.  Abdomen: No  distention. Bowel sounds positive. Soft, no tenderness. Musculoskeletal: Generalized nonfocal weakness. No clubbing / cyanosis. Good ROM, no contractures. Normal muscle tone.  Skin: Multiple areas of ecchymosis and small abrasions on extremities. Please see pictures below. Neurologic: CN 2-12 grossly intact. Sensation intact, DTR normal. Psychiatric: Alert and oriented x 2, partially oriented to situation and time.. Normal mood.           Labs on Admission: I have personally reviewed following labs and imaging studies  CBC: Recent Labs  Lab 08/04/2020 1625  WBC 2.2*  HGB 14.0  HCT 39.9  MCV 100.0  PLT 95*    Basic Metabolic Panel: Recent Labs  Lab 07/30/2020 1625  NA 132*  K 3.6  CL 96*  CO2 23  GLUCOSE 117*  BUN 6*  CREATININE 0.57*  CALCIUM 8.5*    GFR: CrCl cannot be calculated (Unknown ideal weight.).  Liver Function Tests: No results for input(s): AST, ALT, ALKPHOS, BILITOT, PROT, ALBUMIN in the last 168 hours.  Urine analysis:    Component Value Date/Time   COLORURINE AMBER (A) 07/28/2020 2112   APPEARANCEUR CLEAR (A) 08/03/2020 2112   LABSPEC 1.017 08/06/2020 2112   PHURINE 5.0 08/11/2020 2112   GLUCOSEU NEGATIVE 07/24/2020 2112   HGBUR NEGATIVE 07/31/2020 2112   BILIRUBINUR NEGATIVE 07/30/2020 2112   KETONESUR 20 (A) 07/18/2020 2112   PROTEINUR NEGATIVE 07/16/2020 2112   NITRITE NEGATIVE 07/14/2020 2112   LEUKOCYTESUR NEGATIVE 07/20/2020 2112   Radiological Exams on Admission: CT Head Wo Contrast  Result Date: 07/13/2020 CLINICAL DATA:  Larey Seat yesterday, found down after 12 hours, alcohol abuse EXAM: CT HEAD WITHOUT CONTRAST TECHNIQUE: Contiguous axial images were obtained from the base of the skull through the vertex without intravenous contrast. COMPARISON:  12/05/2019, 12/12/2019 FINDINGS: Brain: Confluent hypodensities within the periventricular white matter are compatible with chronic small vessel ischemic changes, stable since previous exam.  Focal hypodensity within the left thalamus is new since previous study, with an appearance suggesting a chronic lacunar infarct. No evidence of acute infarct or hemorrhage. Stable diffuse cerebral atrophy. The lateral ventricles and midline structures are unremarkable. No acute extra-axial fluid collections. No mass effect. Vascular: No hyperdense vessel or unexpected calcification. Skull: Normal. Negative for fracture or focal lesion. Sinuses/Orbits: Kozel thickening within the bilateral ethmoid air cells. Remaining sinuses are clear. Other: None. IMPRESSION: 1. No acute intracranial process. 2. Stable chronic small vessel ischemic changes and cerebral atrophy. 3. Chronic appearing left thalamic lacunar infarct, new since previous study. Electronically Signed   By: Sharlet Salina M.D.   On: 08/05/2020 21:59   DG Chest Portable 1 View  Result Date: 08/05/2020 CLINICAL DATA:  Cough. EXAM: PORTABLE CHEST 1 VIEW COMPARISON:  12/08/2019 prior radiographs FINDINGS: The cardiomediastinal silhouette is unchanged. Chronic interstitial opacities are noted. Slightly increased RIGHT basilar opacity may represent atelectasis or airspace disease. No pleural effusion or pneumothorax identified. No acute bony abnormalities are noted. IMPRESSION: 1. Slightly increased RIGHT basilar opacity -question atelectasis or airspace disease. 2. Chronic interstitial opacities. Electronically Signed   By: Harmon Pier M.D.   On: 08/01/2020 18:03  EKG: Independently reviewed.  Vent. rate 139 BPM PR interval 148 ms QRS duration 84 ms QT/QTc 280/426 ms P-R-T axes 51 270 71 Sinus tachycardia with Premature atrial complexes Left axis deviation ST & T wave abnormality, consider anterior ischemia Abnormal ECG  Assessment/Plan Principal Problem:   Alcohol withdrawal (HCC) Observation/progressive unit. CIWA protocol with lorazepam IV. Thiamine 100 mg IVP x1. Magnesium sulfate 2 g IVPB. Folate, MVI and thiamine  supplementation. Consult TOC team.  Active Problems:   CAP (community acquired pneumonia) Supplemental oxygen as needed. Bronchodilators as needed. Continue ceftriaxone 2 g IVPB daily. Continue Zithromax 500 mg IVPB daily. Mucinex 600 mg p.o. twice daily.    Tobacco abuse Tobacco cessation suggested. Declined nicotine replacement therapy. Staff to provide tobacco cessation information. Follow-up with primary.    Thrombocytopenia (HCC) Likely alcohol induced. Monitor platelet count.    Leukocytopenia Related to chronic alcohol consumption. Monitor WBC.   DVT prophylaxis: SCDs. Code Status:   Full code. Family Communication: Disposition Plan:   Patient is from:  Home.  Anticipated DC to:  TBD.  Anticipated DC date:  07/19/2020 or 07/20/2020.  Anticipated DC barriers: Clinical status.  Consults called:  TOC team. Admission status:  Observation/progressive unit.   Severity of Illness: High severity.  The patient is having moderate severity alcohol withdrawals, has both clinical symptoms and radiological signs of community-acquired pneumonia.  The patient will need to remain in the hospital for IV antibiotics and ethanol withdrawal treatment.  Bobette Mo MD [facility]  How to contact the [facility] Attending or Consulting provider 7A - 7P or covering provider during after hours 7P -7A, for this patient?   1. Check the care team in [facility] and look for a) attending/consulting [facility] provider listed and b) the [facility] team listed 2. Log into [email or web address] and use [place]'s universal password to access. If you do not have the password, please contact the hospital operator. 3. Locate the [facility] provider you are looking for under [facility] and page to a number that you can be directly reached. 4. If you still have difficulty reaching the provider, please page the [facility] (Director on Call) for the Hospitalists listed on amion for assistance.  Aug 15, 2020, 11:53 PM   This  document was prepared using Dragon voice recognition software and may contain some unintended transcription errors.

## 2020-07-17 NOTE — ED Provider Notes (Signed)
[facility] Emergency Department Provider Note ____________________________________________   Event Date/Time   First MD Initiated Contact with Patient 07/30/2020 1635     (approximate)  I have reviewed the triage vital signs and the nursing notes.   HISTORY  Chief Complaint No chief complaint on file.    HPI Jeramiah Mccaughey is a 65 y.o. male with PMH as noted below including a history of alcohol abuse, Warnicke's encephalopathy, and COPD who presents with weakness and multiple falls.  EMS states that they were called by the patient's sister who found him on the floor.  He had been laying there for 12 hours.  EMS reports a history of daily alcoho abuse, as much is 18 beers per day, and frequent falls.  The patient states that he feels somewhat weak and shaky.  He reports some pain to his low back and a cough but denies other acute complaints.   Past Medical History:  Diagnosis Date  . Alcohol abuse   . Tobacco abuse     Patient Active Problem List   Diagnosis Date Noted  . Alcohol withdrawal ([place]) 07/18/2020  . Tachycardia   . Hypokalemia   . Hepatic steatosis   . Acute respiratory failure with hypoxia ([place])   . COPD with acute exacerbation ([place])   . Acute metabolic encephalopathy 24/23/5361  . Wernicke's encephalopathy   . Wheeze   . Alcohol abuse   . Tobacco abuse   . Thrombocytopenia ([place])   . Weakness     History reviewed. No pertinent surgical history.  Prior to Admission medications   Medication Sig Start Date End Date Taking? Authorizing Provider  albuterol (VENTOLIN HFA) 108 (90 Base) MCG/ACT inhaler Inhale 2 puffs into the lungs every 6 (six) hours as needed for wheezing or shortness of breath. 12/18/19   Sidney Ace, MD  diltiazem (CARDIZEM CD) 180 MG 24 hr capsule Take 1 capsule (180 mg total) by mouth daily. 12/19/19 01/18/20  Sidney Ace, MD  QUEtiapine (SEROQUEL) 25 MG tablet Take 1 tablet (25 mg total) by mouth at  bedtime. 12/18/19 01/17/20  Sidney Ace, MD    Allergies Patient has no known allergies.  Family History  Problem Relation Age of Onset  . CAD Father     Social History Social History   Tobacco Use  . Smoking status: Current Every Day Smoker    Packs/day: 2.00    Types: Cigarettes  . Smokeless tobacco: Never Used  Substance Use Topics  . Alcohol use: Yes    Alcohol/week: 18.0 standard drinks    Types: 18 Cans of beer per week    Comment: everyday    Review of Systems  Constitutional: No fever.  Positive for weakness Eyes: No redness. ENT: No sore throat. Cardiovascular: Denies chest pain. Respiratory: Positive for chronic shortness of breath.  Positive for cough. Gastrointestinal: No vomiting or diarrhea.  Genitourinary: Negative for dysuria.  Musculoskeletal: Positive for back pain. Skin: Negative for rash. Neurological: Negative for headache.   ____________________________________________   PHYSICAL EXAM:  VITAL SIGNS: ED Triage Vitals [07/18/2020 1629]  Enc Vitals Group     BP 124/85     Pulse Rate (!) 120     Resp 18     Temp 98.5 F (36.9 C)     Temp Source Oral     SpO2 95 %     Weight      Height      Head Circumference  Peak Flow      Pain Score 5     Pain Loc      Pain Edu?      Excl. in GC?     Constitutional: Alert and oriented, but somewhat slow to respond to certain questions.  Weak and frail appearing but in no acute distress. Eyes: Conjunctivae are normal.  EOMI.  PR delay. Head: Atraumatic. Nose: No congestion/rhinnorhea. Mouth/Throat: Mucous membranes are dry.   Neck: Normal range of motion.  No midline cervical spinal tenderness. Cardiovascular: Tachycardic, regular rhythm. Grossly normal heart sounds.  Good peripheral circulation. Respiratory: Normal respiratory effort.  No retractions.  Wet sounding cough.  Coarse breath sounds bilaterally. Gastrointestinal: Soft and nontender. No distention.  Genitourinary: No flank  tenderness. Musculoskeletal: No lower extremity edema.  Extremities warm and well perfused.  Neurologic:  Normal speech and language.  Moderate tremor and tongue fasciculation.  Motor intact in all extremities. Skin:  Skin is warm and dry. No rash noted. Psychiatric: Flat affect.  Speech and behavior are normal.  ____________________________________________   LABS (all labs ordered are listed, but only abnormal results are displayed)  Labs Reviewed  CBC - Abnormal; Notable for the following components:      Result Value   WBC 2.2 (*)    RBC 3.99 (*)    MCH 35.1 (*)    Platelets 95 (*)    All other components within normal limits  BASIC METABOLIC PANEL - Abnormal; Notable for the following components:   Sodium 132 (*)    Chloride 96 (*)    Glucose, Bld 117 (*)    BUN 6 (*)    Creatinine, Ser 0.57 (*)    Calcium 8.5 (*)    All other components within normal limits  BRAIN NATRIURETIC PEPTIDE - Abnormal; Notable for the following components:   B Natriuretic Peptide 123.6 (*)    All other components within normal limits  URINALYSIS, COMPLETE (UACMP) WITH MICROSCOPIC - Abnormal; Notable for the following components:   Color, Urine AMBER (*)    APPearance CLEAR (*)    Ketones, ur 20 (*)    All other components within normal limits  CK  LACTIC ACID, PLASMA  ETHANOL  LACTIC ACID, PLASMA  COMPREHENSIVE METABOLIC PANEL  MAGNESIUM  PHOSPHORUS  CBC  HEPATIC FUNCTION PANEL  POC SARS CORONAVIRUS 2 AG -  ED  TROPONIN I (HIGH SENSITIVITY)  TROPONIN I (HIGH SENSITIVITY)   ____________________________________________  EKG  ED ECG REPORT I, Dionne Bucy, the attending physician, personally viewed and interpreted this ECG.  Date: Aug 03, 2020 EKG Time: 2231 Rate: 139 Rhythm: Sinus tachycardia with PACs QRS Axis: Left axis Intervals: normal ST/T Wave abnormalities: Nonspecific T wave abnormalities Narrative Interpretation: Sinus tachycardia; no evidence of acute  ischemia  ____________________________________________  RADIOLOGY  Chest x-ray interpreted by me shows faint right lower lobe opacity, likely atelectasis  ____________________________________________   PROCEDURES  Procedure(s) performed: No  Procedures  Critical Care performed: No ____________________________________________   INITIAL IMPRESSION / ASSESSMENT AND PLAN / ED COURSE  Pertinent labs & imaging results that were available during my care of the patient were reviewed by me and considered in my medical decision making (see chart for details).  65 year old male with PMH as noted above including history of alcohol abuse and COPD presents after a fall today, with multiple recent falls in the context of heavy alcohol abuse.  The patient reports some low back pain and cough but denies other acute symptoms.  I reviewed the past medical  records in [place].  The patient was most recently seen in the ED in July 2021 with right leg pain after fall, and he was hospitalized in June with falls and weakness.  On exam, the patient is overall very weak and frail appearing.  He is alert and oriented x4 although somewhat slow to respond to questions.  Vital signs are normal except for tachycardia.  He has moderate tremor and tongue fasciculation but an otherwise nonfocal neurologic exam.  Mucous membranes are very dry.  Overall presentation is consistent with dehydration and likely alcohol withdrawal.  There may be electrolyte abnormalities or other metabolic disturbance.  Differential also includes infection.  Given the nonfocal neuro exam I doubt TBI or other CNS cause.  I have a low suspicion for cardiac cause.  We will obtain labs, give fluids, IV Ativan, and reassess.  I will also obtain a chest x-ray due to the cough as well as a CT head in case he could have had head trauma.  ----------------------------------------- 10:33 PM on  07-29-20 -----------------------------------------  Some of the patient's lab work-up was apparently delayed for unclear reasons.  His heart rate has improved after Ativan although he is still somewhat tremulous.  He has not received much of the liter of NS ordered earlier as he has kept his arm bent.  He has been encouraged to try to keep his arm straight and and the IV is now running well.  Lab work-up is overall reassuring.  His CK, troponin, and lactate are all normal.  Chest x-ray shows a possible right lower lobe infiltrate and he does have a low WBC counts I will cover for CAP.  I discussed the case with Dr. Robb Matar from the hospitalist service for admission  ____________________________________________   FINAL CLINICAL IMPRESSION(S) / ED DIAGNOSES  Final diagnoses:  Alcohol withdrawal syndrome without complication (HCC)  Frequent falls  Weakness  Dehydration      NEW MEDICATIONS STARTED DURING THIS VISIT:  New Prescriptions   No medications on file     Note:  This document was prepared using Dragon voice recognition software and may include unintentional dictation errors.   Dionne Bucy, MD 2020-07-29 2239

## 2020-07-17 NOTE — ED Triage Notes (Signed)
Pt to ED via [facility] from home. Per EMS pt sustained an witnessed fall last night and approximately was in the floor for 12hrs. Pt was found by sister who called EMS. Pt stating frequent falls and chronic daily alcohol use. Pt states 18 cans of beer daily.  Pt denies hx seizures or blood thinners. Pt denies LOC. Pt c/o low back pain. Per EMS pt was able to ambulate to stretcher with assistance. Pt tachy with HR 110. CBG 120. Pt presents covered in stool. Pt has wounds to inner thighs and back of legs and abrasions on arm. Pt also had productive cough.

## 2020-07-17 NOTE — ED Notes (Signed)
Outstanding labs drawn and sent, MD at bedside, Pt to CT

## 2020-07-18 DIAGNOSIS — U071 COVID-19: Secondary | ICD-10-CM

## 2020-07-18 DIAGNOSIS — A4189 Other specified sepsis: Secondary | ICD-10-CM | POA: Diagnosis present

## 2020-07-18 DIAGNOSIS — A419 Sepsis, unspecified organism: Secondary | ICD-10-CM | POA: Diagnosis not present

## 2020-07-18 DIAGNOSIS — I4891 Unspecified atrial fibrillation: Secondary | ICD-10-CM | POA: Diagnosis not present

## 2020-07-18 DIAGNOSIS — Z66 Do not resuscitate: Secondary | ICD-10-CM | POA: Insufficient documentation

## 2020-07-18 DIAGNOSIS — R1310 Dysphagia, unspecified: Secondary | ICD-10-CM | POA: Diagnosis not present

## 2020-07-18 DIAGNOSIS — R531 Weakness: Secondary | ICD-10-CM | POA: Diagnosis not present

## 2020-07-18 DIAGNOSIS — R627 Adult failure to thrive: Secondary | ICD-10-CM | POA: Diagnosis not present

## 2020-07-18 DIAGNOSIS — Z515 Encounter for palliative care: Secondary | ICD-10-CM | POA: Diagnosis not present

## 2020-07-18 DIAGNOSIS — Z8249 Family history of ischemic heart disease and other diseases of the circulatory system: Secondary | ICD-10-CM | POA: Diagnosis not present

## 2020-07-18 DIAGNOSIS — J690 Pneumonitis due to inhalation of food and vomit: Secondary | ICD-10-CM | POA: Diagnosis present

## 2020-07-18 DIAGNOSIS — Z9181 History of falling: Secondary | ICD-10-CM | POA: Diagnosis not present

## 2020-07-18 DIAGNOSIS — Z79899 Other long term (current) drug therapy: Secondary | ICD-10-CM | POA: Diagnosis not present

## 2020-07-18 DIAGNOSIS — R54 Age-related physical debility: Secondary | ICD-10-CM | POA: Diagnosis present

## 2020-07-18 DIAGNOSIS — Z7189 Other specified counseling: Secondary | ICD-10-CM | POA: Diagnosis not present

## 2020-07-18 DIAGNOSIS — D72819 Decreased white blood cell count, unspecified: Secondary | ICD-10-CM

## 2020-07-18 DIAGNOSIS — F10239 Alcohol dependence with withdrawal, unspecified: Secondary | ICD-10-CM | POA: Diagnosis present

## 2020-07-18 DIAGNOSIS — F1010 Alcohol abuse, uncomplicated: Secondary | ICD-10-CM

## 2020-07-18 DIAGNOSIS — E860 Dehydration: Secondary | ICD-10-CM | POA: Diagnosis present

## 2020-07-18 DIAGNOSIS — J1282 Pneumonia due to coronavirus disease 2019: Secondary | ICD-10-CM | POA: Diagnosis present

## 2020-07-18 DIAGNOSIS — R1312 Dysphagia, oropharyngeal phase: Secondary | ICD-10-CM | POA: Diagnosis present

## 2020-07-18 DIAGNOSIS — D696 Thrombocytopenia, unspecified: Secondary | ICD-10-CM | POA: Diagnosis not present

## 2020-07-18 DIAGNOSIS — D6959 Other secondary thrombocytopenia: Secondary | ICD-10-CM | POA: Diagnosis present

## 2020-07-18 DIAGNOSIS — F17210 Nicotine dependence, cigarettes, uncomplicated: Secondary | ICD-10-CM | POA: Diagnosis present

## 2020-07-18 DIAGNOSIS — E871 Hypo-osmolality and hyponatremia: Secondary | ICD-10-CM

## 2020-07-18 DIAGNOSIS — R6520 Severe sepsis without septic shock: Secondary | ICD-10-CM

## 2020-07-18 DIAGNOSIS — I480 Paroxysmal atrial fibrillation: Secondary | ICD-10-CM | POA: Diagnosis not present

## 2020-07-18 DIAGNOSIS — F10230 Alcohol dependence with withdrawal, uncomplicated: Secondary | ICD-10-CM | POA: Diagnosis present

## 2020-07-18 DIAGNOSIS — R296 Repeated falls: Secondary | ICD-10-CM | POA: Diagnosis present

## 2020-07-18 DIAGNOSIS — E876 Hypokalemia: Secondary | ICD-10-CM

## 2020-07-18 DIAGNOSIS — G312 Degeneration of nervous system due to alcohol: Secondary | ICD-10-CM | POA: Diagnosis present

## 2020-07-18 DIAGNOSIS — R64 Cachexia: Secondary | ICD-10-CM | POA: Diagnosis present

## 2020-07-18 DIAGNOSIS — J189 Pneumonia, unspecified organism: Secondary | ICD-10-CM | POA: Diagnosis not present

## 2020-07-18 DIAGNOSIS — J9601 Acute respiratory failure with hypoxia: Secondary | ICD-10-CM | POA: Diagnosis not present

## 2020-07-18 DIAGNOSIS — J440 Chronic obstructive pulmonary disease with acute lower respiratory infection: Secondary | ICD-10-CM | POA: Diagnosis present

## 2020-07-18 LAB — HEPATIC FUNCTION PANEL
ALT: 23 U/L (ref 0–44)
AST: 55 U/L — ABNORMAL HIGH (ref 15–41)
Albumin: 2.9 g/dL — ABNORMAL LOW (ref 3.5–5.0)
Alkaline Phosphatase: 83 U/L (ref 38–126)
Bilirubin, Direct: 0.4 mg/dL — ABNORMAL HIGH (ref 0.0–0.2)
Indirect Bilirubin: 1.2 mg/dL — ABNORMAL HIGH (ref 0.3–0.9)
Total Bilirubin: 1.6 mg/dL — ABNORMAL HIGH (ref 0.3–1.2)
Total Protein: 6.4 g/dL — ABNORMAL LOW (ref 6.5–8.1)

## 2020-07-18 LAB — COMPREHENSIVE METABOLIC PANEL
ALT: 22 U/L (ref 0–44)
AST: 56 U/L — ABNORMAL HIGH (ref 15–41)
Albumin: 2.6 g/dL — ABNORMAL LOW (ref 3.5–5.0)
Alkaline Phosphatase: 73 U/L (ref 38–126)
Anion gap: 9 (ref 5–15)
BUN: 7 mg/dL — ABNORMAL LOW (ref 8–23)
CO2: 23 mmol/L (ref 22–32)
Calcium: 7.5 mg/dL — ABNORMAL LOW (ref 8.9–10.3)
Chloride: 101 mmol/L (ref 98–111)
Creatinine, Ser: 0.56 mg/dL — ABNORMAL LOW (ref 0.61–1.24)
GFR, Estimated: >60 mL/min (ref >60)
Glucose, Bld: 100 mg/dL — ABNORMAL HIGH (ref 70–99)
Potassium: 3.2 mmol/L — ABNORMAL LOW (ref 3.5–5.1)
Sodium: 133 mmol/L — ABNORMAL LOW (ref 135–145)
Total Bilirubin: 1.4 mg/dL — ABNORMAL HIGH (ref 0.3–1.2)
Total Protein: 6 g/dL — ABNORMAL LOW (ref 6.5–8.1)

## 2020-07-18 LAB — RESP PANEL BY RT-PCR (FLU A&B, COVID) ARPGX2
Influenza A by PCR: NEGATIVE
Influenza B by PCR: NEGATIVE
SARS Coronavirus 2 by RT PCR: POSITIVE — AB

## 2020-07-18 LAB — CBC
HCT: 37.1 % — ABNORMAL LOW (ref 39.0–52.0)
Hemoglobin: 12.9 g/dL — ABNORMAL LOW (ref 13.0–17.0)
MCH: 35.4 pg — ABNORMAL HIGH (ref 26.0–34.0)
MCHC: 34.8 g/dL (ref 30.0–36.0)
MCV: 101.9 fL — ABNORMAL HIGH (ref 80.0–100.0)
Platelets: 74 10*3/uL — ABNORMAL LOW (ref 150–400)
RBC: 3.64 MIL/uL — ABNORMAL LOW (ref 4.22–5.81)
RDW: 12.5 % (ref 11.5–15.5)
WBC: 2.6 10*3/uL — ABNORMAL LOW (ref 4.0–10.5)
nRBC: 0 % (ref 0.0–0.2)

## 2020-07-18 LAB — D-DIMER, QUANTITATIVE: D-Dimer, Quant: >20 ug/mL-FEU — ABNORMAL HIGH (ref 0.00–0.50)

## 2020-07-18 LAB — C-REACTIVE PROTEIN: CRP: 4 mg/dL — ABNORMAL HIGH (ref <1.0)

## 2020-07-18 LAB — STREP PNEUMONIAE URINARY ANTIGEN: Strep Pneumo Urinary Antigen: NEGATIVE

## 2020-07-18 LAB — PROCALCITONIN
Procalcitonin: <0.1 ng/mL
Procalcitonin: 0.47 ng/mL

## 2020-07-18 LAB — FERRITIN: Ferritin: 1129 ng/mL — ABNORMAL HIGH (ref 24–336)

## 2020-07-18 LAB — MAGNESIUM: Magnesium: 1.8 mg/dL (ref 1.7–2.4)

## 2020-07-18 LAB — PHOSPHORUS: Phosphorus: 2.5 mg/dL (ref 2.5–4.6)

## 2020-07-18 MED ORDER — METHYLPREDNISOLONE SODIUM SUCC 125 MG IJ SOLR
1.0000 mg/kg | Freq: Two times a day (BID) | INTRAMUSCULAR | Status: AC
  Administered 2020-07-18 – 2020-07-21 (×6): 80 mg via INTRAVENOUS
  Filled 2020-07-18 (×6): qty 2

## 2020-07-18 MED ORDER — DM-GUAIFENESIN ER 30-600 MG PO TB12
1.0000 | ORAL_TABLET | Freq: Two times a day (BID) | ORAL | Status: DC
  Administered 2020-07-18: 1 via ORAL
  Filled 2020-07-18: qty 1

## 2020-07-18 MED ORDER — SODIUM CHLORIDE 0.9 % IV SOLN
1.0000 mg | Freq: Every day | INTRAVENOUS | Status: DC
  Administered 2020-07-18 – 2020-07-26 (×8): 1 mg via INTRAVENOUS
  Filled 2020-07-18 (×12): qty 0.2

## 2020-07-18 MED ORDER — IPRATROPIUM-ALBUTEROL 0.5-2.5 (3) MG/3ML IN SOLN: 3.0000 mL | RESPIRATORY_TRACT | Status: DC | PRN

## 2020-07-18 MED ORDER — POTASSIUM CHLORIDE IN NACL 20-0.9 MEQ/L-% IV SOLN
INTRAVENOUS | Status: DC
  Filled 2020-07-18 (×10): qty 1000

## 2020-07-18 MED ORDER — SODIUM CHLORIDE 0.9 % IV SOLN
100.0000 mg | Freq: Every day | INTRAVENOUS | Status: AC
  Administered 2020-07-19 – 2020-07-22 (×4): 100 mg via INTRAVENOUS
  Filled 2020-07-18 (×4): qty 20

## 2020-07-18 MED ORDER — SODIUM CHLORIDE 0.9 % IV SOLN
200.0000 mg | Freq: Once | INTRAVENOUS | Status: AC
  Administered 2020-07-18: 200 mg via INTRAVENOUS
  Filled 2020-07-18: qty 200

## 2020-07-18 MED ORDER — PREDNISONE 50 MG PO TABS: 50.0000 mg | ORAL_TABLET | Freq: Every day | ORAL | Status: DC

## 2020-07-18 NOTE — Progress Notes (Signed)
SLP Cancellation Note  Patient Details Name: William [name] MRN: [id number] DOB: 1955/12/10   Cancelled treatment:       Reason Eval/Treat Not Completed: Medical issues which prohibited therapy   This pt is known to this therapist. Therapist performed a MBSS on pt during a previous hospitalization (02/07/2020). Pt demonstrated severe oropharyngeal dysphagia with frank silent aspiration of all consistencies. At that time, it was recommended that pt be strictly NPO with long-term alternative means of nutrition. Per chart it appears pt discharged on dysphagia 3 with thin liquids.   Given severity of silent aspiration, a bedside swallow evaluation is not appropriate. Spoke with current attending (Dr Renae Gloss) who provides that he is making pt NPO d/t inability to take medications with nursing today. Education provided on results of previous instrumental study and need for further instrumental study for safe diet recommendation. At this time pt is unable to have instrumental study to COVID status. Palliative Care would be an appropriate referral as pt requested return to PO intake during previous admission.    ST will sign off at this time.    Janyce Ellinger B. Dreama Saa M.S., CCC-SLP, [facility] Speech-Language Pathologist Rehabilitation Services Office [phone]  Reuel Derby 07/18/2020, 2:28 PM

## 2020-07-18 NOTE — Progress Notes (Signed)
Remdesivir - Pharmacy Brief Note   O:  ALT: 22 CXR: Slightly increased RIGHT basilar opacity -question atelectasis or airspace disease. SpO2: 92-95% on RA   A/P:  Remdesivir 200 mg IVPB once followed by 100 mg IVPB daily x 4 days.   Gardner Candle, PharmD, BCPS Clinical Pharmacist 07/18/2020 11:17 AM

## 2020-07-18 NOTE — ED Notes (Signed)
CIWA assessment limited d/t patient lethargic and not responding to staff.

## 2020-07-18 NOTE — ED Notes (Signed)
Patient noted to have saturated brief and bed pads, patient full linen change, primofit placed on patient, pt not responding to questions but only opens eyes, patient noted to have scattered wounds to arms and legs, redness noted to sacrum, sponge dressing applied, patient skin cleansed with wipes, socks placed on patient.

## 2020-07-18 NOTE — ED Notes (Signed)
Contacted provider for covid swab order.

## 2020-07-18 NOTE — ED Notes (Signed)
Notified EDP and attending of covid positive status.

## 2020-07-18 NOTE — Progress Notes (Signed)
PT Cancellation Note  Patient Details Name: William [name] MRN: [id number] DOB: 04/08/1956   Cancelled Treatment:    Reason Eval/Treat Not Completed: Fatigue/lethargy limiting ability to participate. Pt able to rouse minimally to PT touch and voice, but unable to participate in meaningful evaluation, PT to re-attempt as able.  Olga Coaster PT, DPT 10:14 AM,07/18/20

## 2020-07-18 NOTE — ED Notes (Signed)
Updated EDP on most recent VS including T of 103.2 axillary.

## 2020-07-18 NOTE — ED Notes (Signed)
Pt sleeping. Contacted attending to request meds by IV route only.

## 2020-07-18 NOTE — Progress Notes (Signed)
Patient ID: William [name], male   DOB: 09-21-1955, 65 y.o.   MRN: [id number] [facility] PROGRESS NOTE  Chantry Headen LOV:[id number] DOB: 05/02/1956 DOA: 07/13/2020 PCP: [facility]  HPI/Subjective: Patient able to look at me and tries to answer few questions but hard to understand what he is saying.  Admitted by being found down on the ground by his sister.  Also had pneumonia.  Objective: Vitals:   07/18/20 1245 07/18/20 1254  BP:    Pulse: (!) 119   Resp: (!) 30   Temp:  99.6 F (37.6 C)  SpO2: 100%    No intake or output data in the 24 hours ending 07/18/20 1301 Filed Weights   07/18/20 1120  Weight: 80 kg    ROS: Review of Systems  Respiratory: Negative for shortness of breath.   Cardiovascular: Negative for chest pain.  Gastrointestinal: Negative for abdominal pain.   Exam: Physical Exam HENT:     Head: Normocephalic.     Mouth/Throat:     Comments: Patient did not open his mouth today. Eyes:     General: Lids are normal.     Conjunctiva/sclera: Conjunctivae normal.  Cardiovascular:     Rate and Rhythm: Normal rate and regular rhythm.     Heart sounds: Normal heart sounds, S1 normal and S2 normal.  Pulmonary:     Breath sounds: Examination of the right-lower field reveals decreased breath sounds and rhonchi. Examination of the left-lower field reveals decreased breath sounds and rhonchi. Decreased breath sounds and rhonchi present. No wheezing or rales.  Abdominal:     Palpations: Abdomen is soft.     Tenderness: There is no abdominal tenderness.  Musculoskeletal:     Right ankle: Swelling present.     Left ankle: Swelling present.  Skin:    General: Skin is warm.     Comments: Bruising on arms Unkempt and brownish blackish discoloration between toes  Neurological:     Mental Status: He is lethargic.       Data Reviewed: Basic Metabolic Panel: Recent Labs  Lab 07/25/2020 1625 07/26/2020 2112 07/18/20 0557  NA 132*  --  133*  K 3.6  --   3.2*  CL 96*  --  101  CO2 23  --  23  GLUCOSE 117*  --  100*  BUN 6*  --  7*  CREATININE 0.57*  --  0.56*  CALCIUM 8.5*  --  7.5*  MG  --  1.8  --   PHOS  --  2.5  --    Liver Function Tests: Recent Labs  Lab 07/19/2020 2112 07/18/20 0557  AST 55* 56*  ALT 23 22  ALKPHOS 83 73  BILITOT 1.6* 1.4*  PROT 6.4* 6.0*  ALBUMIN 2.9* 2.6*   CBC: Recent Labs  Lab 08/01/2020 1625 07/18/20 0557  WBC 2.2* 2.6*  HGB 14.0 12.9*  HCT 39.9 37.1*  MCV 100.0 101.9*  PLT 95* 74*   Cardiac Enzymes: Recent Labs  Lab 07/14/2020 1732  CKTOTAL 188   BNP (last 3 results) Recent Labs    08/10/2020 1625  BNP 123.6*       Recent Results (from the past 240 hour(s))  Resp Panel by RT-PCR (Flu A&B, Covid) Nasopharyngeal Swab     Status: Abnormal   Collection Time: 07/18/20  9:06 AM   Specimen: Nasopharyngeal Swab; Nasopharyngeal(NP) swabs in vial transport medium  Result Value Ref Range Status   SARS Coronavirus 2 by RT PCR POSITIVE (A) NEGATIVE Final  Comment: RESULT CALLED TO, READ BACK BY AND VERIFIED WITH: REINA GALJOUR AT 1057 07/18/20.PMF (NOTE) SARS-CoV-2 target nucleic acids are DETECTED.  The SARS-CoV-2 RNA is generally detectable in upper respiratory specimens during the acute phase of infection. Positive results are indicative of the presence of the identified virus, but do not rule out bacterial infection or co-infection with other pathogens not detected by the test. Clinical correlation with patient history and other diagnostic information is necessary to determine patient infection status. The expected result is Negative.  Fact Sheet for Patients: BloggerCourse.com  Fact Sheet for Healthcare Providers: SeriousBroker.it  This test is not yet approved or cleared by the [place] FDA and  has been authorized for detection and/or diagnosis of SARS-CoV-2 by FDA under an Emergency Use Authorization (EUA).  This EUA  will remain in effect (meaning this test can be  used) for the duration of  the COVID-19 declaration under Section 564(b)(1) of the Act, 21 U.S.C. section 360bbb-3(b)(1), unless the authorization is terminated or revoked sooner.     Influenza A by PCR NEGATIVE NEGATIVE Final   Influenza B by PCR NEGATIVE NEGATIVE Final    Comment: (NOTE) The Xpert Xpress SARS-CoV-2/FLU/RSV plus assay is intended as an aid in the diagnosis of influenza from Nasopharyngeal swab specimens and should not be used as a sole basis for treatment. Nasal washings and aspirates are unacceptable for Xpert Xpress SARS-CoV-2/FLU/RSV testing.  Fact Sheet for Patients: BloggerCourse.com  Fact Sheet for Healthcare Providers: SeriousBroker.it  This test is not yet approved or cleared by the [place] FDA and has been authorized for detection and/or diagnosis of SARS-CoV-2 by FDA under an Emergency Use Authorization (EUA). This EUA will remain in effect (meaning this test can be used) for the duration of the COVID-19 declaration under Section 564(b)(1) of the Act, 21 U.S.C. section 360bbb-3(b)(1), unless the authorization is terminated or revoked.  Performed at [facility], [address] [place], [place] [address]      Studies: CT Head Wo Contrast  Result Date: 08/10/2020 CLINICAL DATA:  Larey Seat yesterday, found down after 12 hours, alcohol abuse EXAM: CT HEAD WITHOUT CONTRAST TECHNIQUE: Contiguous axial images were obtained from the base of the skull through the vertex without intravenous contrast. COMPARISON:  12/05/2019, 12/12/2019 FINDINGS: Brain: Confluent hypodensities within the periventricular white matter are compatible with chronic small vessel ischemic changes, stable since previous exam. Focal hypodensity within the left thalamus is new since previous study, with an appearance suggesting a chronic lacunar infarct. No evidence of acute  infarct or hemorrhage. Stable diffuse cerebral atrophy. The lateral ventricles and midline structures are unremarkable. No acute extra-axial fluid collections. No mass effect. Vascular: No hyperdense vessel or unexpected calcification. Skull: Normal. Negative for fracture or focal lesion. Sinuses/Orbits: Kozel thickening within the bilateral ethmoid air cells. Remaining sinuses are clear. Other: None. IMPRESSION: 1. No acute intracranial process. 2. Stable chronic small vessel ischemic changes and cerebral atrophy. 3. Chronic appearing left thalamic lacunar infarct, new since previous study. Electronically Signed   By: Sharlet Salina M.D.   On: 07/15/2020 21:59   DG Chest Portable 1 View  Result Date: 08/01/2020 CLINICAL DATA:  Cough. EXAM: PORTABLE CHEST 1 VIEW COMPARISON:  12/08/2019 prior radiographs FINDINGS: The cardiomediastinal silhouette is unchanged. Chronic interstitial opacities are noted. Slightly increased RIGHT basilar opacity may represent atelectasis or airspace disease. No pleural effusion or pneumothorax identified. No acute bony abnormalities are noted. IMPRESSION: 1. Slightly increased RIGHT basilar opacity -question atelectasis or airspace disease. 2.  Chronic interstitial opacities. Electronically Signed   By: Harmon Pier M.D.   On: 08/04/2020 18:03    Scheduled Meds: . LORazepam  0-4 mg Intravenous Q4H   Followed by  . [START ON 07/19/2020] LORazepam  0-4 mg Intravenous Q8H  . methylPREDNISolone (SOLU-MEDROL) injection  1 mg/kg Intravenous Q12H   Followed by  . [START ON 07/21/2020] predniSONE  50 mg Oral Daily  . thiamine  100 mg Intravenous Daily   Continuous Infusions: . 0.9 % NaCl with KCl 20 mEq / L Stopped (07/18/20 0543)  . azithromycin Stopped (07/18/20 0543)  . cefTRIAXone (ROCEPHIN)  IV Stopped (07/18/20 0354)  . folic acid (FOLVITE) IVPB 1 mg (07/18/20 1232)  . remdesivir 200 mg in sodium chloride 0.9% 250 mL IVPB 200 mg (07/18/20 1233)   Followed by  . [START ON  07/19/2020] remdesivir 100 mg in NS 100 mL      Assessment/Plan:  1. Severe sepsis, present on admission, acute metabolic encephalopathy.  Patient spiked a temperature of 103 with elevated heart rate and leukopenia.  Patient has pneumonia and also found to be Covid positive.  I sent off Covid test this morning.  Start steroids and remdesivir.  Send off inflammatory markers.  On empiric antibiotics with Rocephin and Zithromax.  Procalcitonin slightly elevated to 0.47. 2. Alcohol abuse on alcohol withdrawal protocol 3. Dysphagia.  Patient has not done well with swallowing pills in the emergency room.  Case discussed with speech pathologist and patient did not do well with swallow evaluation on the last hospitalization.  Patient made n.p.o. except meds at this time. 4. Hypokalemia and hyponatremia.  IV fluids with potassium 5. Leukopenia and thrombocytopenia could be secondary to alcohol abuse.  Continue to monitor. 6. Spoke with sister on the phone and I made the patient a DO NOT RESUSCITATE since he was a DO NOT RESUSCITATE on last admission. 7. We will get palliative care consultation.       Code Status:     Code Status Orders  (From admission, onward)         Start     Ordered   07/18/20 1256  Do not attempt resuscitation (DNR)  Continuous       Question Answer Comment  In the event of cardiac or respiratory ARREST Do not call a "code blue"   In the event of cardiac or respiratory ARREST Do not perform Intubation, CPR, defibrillation or ACLS   In the event of cardiac or respiratory ARREST Use medication by any route, position, wound care, and other measures to relive pain and suffering. May use oxygen, suction and manual treatment of airway obstruction as needed for comfort.      07/18/20 1255        Code Status History    Date Active Date Inactive Code Status Order ID Comments User Context   07/25/2020 2235 07/18/2020 1255 Full Code [id number]  Bobette Mo, MD ED   12/06/2019  1519 12/18/2019 2042 DNR [id number]  Alford Highland, MD ED   12/05/2019 1617 12/06/2019 1519 Full Code [id number]  Shaune Pollack, MD ED   11/15/2019 1454 11/17/2019 0858 Full Code [id number]  Lorre Nick, MD ED   Advance Care Planning Activity     Family Communication: Spoke with the patient's sister on the phone Disposition Plan: Status is: Inpatient  Dispo: The patient is from: Home              Anticipated d/c is to: To be determined  Anticipated d/c date is: Unable to assess likely greater than 4 days here in the hospital              Patient currently being treated for severe sepsis with acute metabolic encephalopathy COVID-19 or aspiration pneumonia.  Antibiotics:  Rocephin  Zithromax  Time spent: 28 minutes  Bettyjo [employer]

## 2020-07-18 NOTE — ED Notes (Signed)
Called lab . They will come to draw blood cultures times 2.

## 2020-07-19 ENCOUNTER — Inpatient Hospital Stay: Payer: No Typology Code available for payment source

## 2020-07-19 ENCOUNTER — Encounter: Payer: Self-pay | Admitting: Internal Medicine

## 2020-07-19 DIAGNOSIS — J189 Pneumonia, unspecified organism: Secondary | ICD-10-CM | POA: Diagnosis not present

## 2020-07-19 DIAGNOSIS — U071 COVID-19: Secondary | ICD-10-CM | POA: Diagnosis not present

## 2020-07-19 DIAGNOSIS — J1282 Pneumonia due to coronavirus disease 2019: Secondary | ICD-10-CM | POA: Diagnosis not present

## 2020-07-19 LAB — COMPREHENSIVE METABOLIC PANEL
ALT: 23 U/L (ref 0–44)
AST: 54 U/L — ABNORMAL HIGH (ref 15–41)
Albumin: 2.2 g/dL — ABNORMAL LOW (ref 3.5–5.0)
Alkaline Phosphatase: 58 U/L (ref 38–126)
Anion gap: 9 (ref 5–15)
BUN: 13 mg/dL (ref 8–23)
CO2: 25 mmol/L (ref 22–32)
Calcium: 8 mg/dL — ABNORMAL LOW (ref 8.9–10.3)
Chloride: 105 mmol/L (ref 98–111)
Creatinine, Ser: 0.39 mg/dL — ABNORMAL LOW (ref 0.61–1.24)
GFR, Estimated: >60 mL/min (ref >60)
Glucose, Bld: 125 mg/dL — ABNORMAL HIGH (ref 70–99)
Potassium: 3.7 mmol/L (ref 3.5–5.1)
Sodium: 139 mmol/L (ref 135–145)
Total Bilirubin: 1.1 mg/dL (ref 0.3–1.2)
Total Protein: 5.2 g/dL — ABNORMAL LOW (ref 6.5–8.1)

## 2020-07-19 LAB — CBC WITH DIFFERENTIAL/PLATELET
Abs Immature Granulocytes: 0.04 10*3/uL (ref 0.00–0.07)
Basophils Absolute: 0 10*3/uL (ref 0.0–0.1)
Basophils Relative: 0 %
Eosinophils Absolute: 0 10*3/uL (ref 0.0–0.5)
Eosinophils Relative: 0 %
HCT: 38 % — ABNORMAL LOW (ref 39.0–52.0)
Hemoglobin: 12.9 g/dL — ABNORMAL LOW (ref 13.0–17.0)
Immature Granulocytes: 1 %
Lymphocytes Relative: 7 %
Lymphs Abs: 0.5 10*3/uL — ABNORMAL LOW (ref 0.7–4.0)
MCH: 35.4 pg — ABNORMAL HIGH (ref 26.0–34.0)
MCHC: 33.9 g/dL (ref 30.0–36.0)
MCV: 104.4 fL — ABNORMAL HIGH (ref 80.0–100.0)
Monocytes Absolute: 0.3 10*3/uL (ref 0.1–1.0)
Monocytes Relative: 5 %
Neutro Abs: 5.3 10*3/uL (ref 1.7–7.7)
Neutrophils Relative %: 87 %
Platelets: 83 10*3/uL — ABNORMAL LOW (ref 150–400)
RBC: 3.64 MIL/uL — ABNORMAL LOW (ref 4.22–5.81)
RDW: 12.7 % (ref 11.5–15.5)
WBC: 6.1 10*3/uL (ref 4.0–10.5)
nRBC: 0 % (ref 0.0–0.2)

## 2020-07-19 LAB — C-REACTIVE PROTEIN: CRP: 9.7 mg/dL — ABNORMAL HIGH (ref <1.0)

## 2020-07-19 LAB — D-DIMER, QUANTITATIVE: D-Dimer, Quant: 3.62 ug/mL-FEU — ABNORMAL HIGH (ref 0.00–0.50)

## 2020-07-19 MED ORDER — ENOXAPARIN SODIUM 40 MG/0.4ML ~~LOC~~ SOLN
40.0000 mg | SUBCUTANEOUS | Status: DC
  Administered 2020-07-19 – 2020-08-02 (×14): 40 mg via SUBCUTANEOUS
  Filled 2020-07-19 (×15): qty 0.4

## 2020-07-19 MED ORDER — IOHEXOL 350 MG/ML SOLN
75.0000 mL | Freq: Once | INTRAVENOUS | Status: AC | PRN
  Administered 2020-07-19: 10:00:00 75 mL via INTRAVENOUS

## 2020-07-19 NOTE — Plan of Care (Signed)

## 2020-07-19 NOTE — Plan of Care (Signed)
  Problem: Education: Goal: Knowledge of General Education information will improve Description: Including pain rating scale, medication(s)/side effects and non-pharmacologic comfort measures 07/19/2020 0235 by Lucky Cowboy, RN Outcome: Progressing 07/19/2020 0235 by Lucky Cowboy, RN Outcome: Progressing   Problem: Health Behavior/Discharge Planning: Goal: Ability to manage health-related needs will improve 07/19/2020 0235 by Lucky Cowboy, RN Outcome: Progressing 07/19/2020 0235 by Marinus Maw D, RN Outcome: Progressing   Problem: Clinical Measurements: Goal: Ability to maintain clinical measurements within normal limits will improve 07/19/2020 0235 by Lucky Cowboy, RN Outcome: Progressing 07/19/2020 0235 by Marinus Maw D, RN Outcome: Progressing Goal: Will remain free from infection 07/19/2020 0235 by Lucky Cowboy, RN Outcome: Progressing 07/19/2020 0235 by Marinus Maw D, RN Outcome: Progressing Goal: Diagnostic test results will improve 07/19/2020 0235 by Lucky Cowboy, RN Outcome: Progressing 07/19/2020 0235 by Marinus Maw D, RN Outcome: Progressing Goal: Respiratory complications will improve 07/19/2020 0235 by Marinus Maw D, RN Outcome: Progressing 07/19/2020 0235 by Marinus Maw D, RN Outcome: Progressing Goal: Cardiovascular complication will be avoided 07/19/2020 0235 by Lucky Cowboy, RN Outcome: Progressing 07/19/2020 0235 by Lucky Cowboy, RN Outcome: Progressing   Problem: Activity: Goal: Risk for activity intolerance will decrease 07/19/2020 0235 by Lucky Cowboy, RN Outcome: Progressing 07/19/2020 0235 by Marinus Maw D, RN Outcome: Progressing   Problem: Nutrition: Goal: Adequate nutrition will be maintained 07/19/2020 0235 by Lucky Cowboy, RN Outcome: Progressing 07/19/2020 0235 by Marinus Maw D, RN Outcome: Progressing   Problem: Coping: Goal: Level of anxiety will decrease 07/19/2020 0235 by Lucky Cowboy, RN Outcome:  Progressing 07/19/2020 0235 by Marinus Maw D, RN Outcome: Progressing   Problem: Elimination: Goal: Will not experience complications related to bowel motility 07/19/2020 0235 by Lucky Cowboy, RN Outcome: Progressing 07/19/2020 0235 by Marinus Maw D, RN Outcome: Progressing Goal: Will not experience complications related to urinary retention 07/19/2020 0235 by Lucky Cowboy, RN Outcome: Progressing 07/19/2020 0235 by Lucky Cowboy, RN Outcome: Progressing   Problem: Pain Managment: Goal: General experience of comfort will improve 07/19/2020 0235 by Lucky Cowboy, RN Outcome: Progressing 07/19/2020 0235 by Lucky Cowboy, RN Outcome: Progressing   Problem: Safety: Goal: Ability to remain free from injury will improve 07/19/2020 0235 by Lucky Cowboy, RN Outcome: Progressing 07/19/2020 0235 by Marinus Maw D, RN Outcome: Progressing   Problem: Skin Integrity: Goal: Risk for impaired skin integrity will decrease 07/19/2020 0235 by Lucky Cowboy, RN Outcome: Progressing 07/19/2020 0235 by Lucky Cowboy, RN Outcome: Progressing   Problem: Education: Goal: Knowledge of risk factors and measures for prevention of condition will improve 07/19/2020 0235 by Lucky Cowboy, RN Outcome: Progressing 07/19/2020 0235 by Marinus Maw D, RN Outcome: Progressing   Problem: Coping: Goal: Psychosocial and spiritual needs will be supported 07/19/2020 0235 by Lucky Cowboy, RN Outcome: Progressing 07/19/2020 0235 by Marinus Maw D, RN Outcome: Progressing   Problem: Respiratory: Goal: Will maintain a patent airway 07/19/2020 0235 by Lucky Cowboy, RN Outcome: Progressing 07/19/2020 0235 by Marinus Maw D, RN Outcome: Progressing Goal: Complications related to the disease process, condition or treatment will be avoided or minimized 07/19/2020 0235 by Lucky Cowboy, RN Outcome: Progressing 07/19/2020 0235 by Lucky Cowboy, RN Outcome: Progressing

## 2020-07-19 NOTE — Evaluation (Signed)
Physical Therapy Evaluation Patient Details Name: William [name] MRN: [id number] DOB: Dec 13, 1955 Today's Date: 07/19/2020   History of Present Illness  William [name] is a 65 y.o. male with medical history significant of Wernicke encephalopathy, hypertension, alcohol and tobacco abuse who is brought to the emergency department via EMS 08/10/2020 after sustaining an unwitnessed fall at home last night with inability to stand up afterwards and remained on the floor for about 12 hours.  Clinical Impression  Pt admitted with above diagnosis. Pt currently with functional limitations due to the deficits listed below (see "PT Problem List"). Upon entry, pt in bed, awake but drowsy and agreeable to participate. The pt is pleasant, interactive, and able to provide some basic info regarding prior level of function, both in tolerance and independence, however he is sometimes tangential and often difficult to hear due to hypophonia and dysarthria, the latter believed to be chronic based on prior PT notes. Patient's performance this date reveals decreased ability, independence, and tolerance in performing all basic mobility required for performance of activities of daily living. Pt requires additional DME, close physical assistance, and cues for safe participate in mobility. Attempting AMB not ideal at this time due to weakness/drowsiness. Pt will benefit from skilled PT intervention to increase independence and safety with basic mobility in preparation for discharge to the venue listed below.        Follow Up Recommendations SNF;Supervision for mobility/OOB;Supervision - Intermittent    Equipment Recommendations  None recommended by PT    Recommendations for Other Services       Precautions / Restrictions Precautions Precautions: Fall Precaution Comments: wounds, bleeding Restrictions Weight Bearing Restrictions: No      Mobility  Bed Mobility Overal bed mobility: Needs Assistance Bed Mobility: Supine to  Sit;Sit to Supine     Supine to sit: Min assist Sit to supine: Min assist   General bed mobility comments: appears listless, but puts forth effort    Transfers Overall transfer level: Needs assistance Equipment used: Rolling walker (2 wheeled) Transfers: Sit to/from Stand Sit to Stand: Mod assist;+2 physical assistance         General transfer comment: pt aware of need for higher surface height before starting. Once in standing balanced well with RW  Ambulation/Gait   Gait Distance (Feet):  (deferred due to weak state, drowsinesss)            Stairs            Wheelchair Mobility    Modified Rankin (Stroke Patients Only)       Balance Overall balance assessment: Needs assistance;History of Falls Sitting-balance support: Bilateral upper extremity supported Sitting balance-Leahy Scale: Poor     Standing balance support: Bilateral upper extremity supported Standing balance-Leahy Scale: Poor                               Pertinent Vitals/Pain Pain Assessment: No/denies pain    Home Living Family/patient expects to be discharged to:: Skilled nursing facility Living Arrangements: Alone                    Prior Function Level of Independence: Needs assistance   Gait / Transfers Assistance Needed: RW intermittently in home; buggy needed when out  ADL's / Homemaking Assistance Needed: appears unkempt, disheveled  Comments: c rolling walker     Hand Dominance        Extremity/Trunk Assessment  Communication      Cognition Arousal/Alertness:  (seems drowsy) Behavior During Therapy:  (clam, delayed, interactive, follows commands) Overall Cognitive Status: Impaired/Different from baseline                                        General Comments      Exercises     Assessment/Plan    PT Assessment Patient needs continued PT services  PT Problem List Decreased strength;Decreased  activity tolerance;Decreased balance;Decreased mobility;Decreased cognition;Decreased safety awareness;Decreased knowledge of precautions       PT Treatment Interventions DME instruction;Gait training;Stair training;Functional mobility training;Therapeutic activities;Therapeutic exercise;Balance training;Patient/family education;Cognitive remediation    PT Goals (Current goals can be found in the Care Plan section)  Acute Rehab PT Goals PT Goal Formulation: Patient unable to participate in goal setting Time For Goal Achievement: 08/09/2020 Potential to Achieve Goals: Fair    Frequency Min 2X/week   Barriers to discharge Decreased caregiver support      Co-evaluation               AM-PAC PT "6 Clicks" Mobility  Outcome Measure Help needed turning from your back to your side while in a flat bed without using bedrails?: A Lot Help needed moving from lying on your back to sitting on the side of a flat bed without using bedrails?: A Lot Help needed moving to and from a bed to a chair (including a wheelchair)?: A Lot Help needed standing up from a chair using your arms (e.g., wheelchair or bedside chair)?: A Lot Help needed to walk in hospital room?: A Lot Help needed climbing 3-5 steps with a railing? : Total 6 Click Score: 11    End of Session Equipment Utilized During Treatment: Oxygen;Gait belt Activity Tolerance: Patient tolerated treatment well;Patient limited by fatigue;Patient limited by lethargy Patient left: in bed;with call bell/phone within reach;with bed alarm set Nurse Communication: Mobility status PT Visit Diagnosis: Difficulty in walking, not elsewhere classified (R26.2);Other abnormalities of gait and mobility (R26.89);Muscle weakness (generalized) (M62.81);Repeated falls (R29.6)    Time: 9147-8295 PT Time Calculation (min) (ACUTE ONLY): 25 min   Charges:   PT Evaluation $PT Eval Moderate Complexity: 1 Mod         12:47 PM, 07/19/20 Rosamaria Lints,  PT, DPT Physical Therapist - [facility]  [phone] (ASCOM)   Ayse Mccartin C 07/19/2020, 12:45 PM

## 2020-07-19 NOTE — Progress Notes (Signed)
PROGRESS NOTE    Delila SpenceRobert Pha  ZOX:[id number] DOB: 12/06/1955 DOA: 07/20/2020 [facility]   No chief complaint on file.   Brief Narrative 65 year old male with history of Warnicke's encephalopathy, hypertension, alcohol and tobacco abuse brought to the ED on 08/01/2020 after unwitnessed fall at home the night before and inability to stand up and remained on the floor x2 lowers, found by sister. In the ED patient was found to have leukopenia, thrombocytopenia, hyponatremia, hypokalemia abnormal AST chest x-ray with right basilar opacity question atelectasis or airspace disease, chronic interstitial changes CT head no acute finding, stable chronic small vessel changes, patient is status IV antibiotics IV fluid bolus for community-acquired pneumonia suspected to have alcohol withdrawal, unwitnessed fall, severe sepsis, acute metabolic encephalopathy 1/7:Hard to understand what he is saying eval to look and trying to answer for question, COVID-19 came back positive.  Subjective: Seen this morning patient had IV infiltration CTA not completed.  He is not in any distress. Resting comfortably. On 1 L nasal cannula this morning.  Blood pressure stable.  Assessment & Plan:  Severe sepsis present on admission  2/2 aspiration pneumonia/COVID-19 pneumonia: Continue on current COVID-19 treatment protocol steroids/remdesivir along with empiric antibiotics Rocephin and Zithromax, monitor procalcitonin- at 0.47 and inflammatory markers as below.  Respiratory status stable on 2>1l now improved from 6 L.  Hypotension resolved with IV fluids, continue the same. COVID-19 Labs. Recent Labs    07/18/20 0557 07/18/20 1300  DDIMER  --  >20.00*  FERRITIN 1,129*  --   CRP 4.0*  --    Lab Results  Component Value Date   SARSCOV2NAA POSITIVE (A) 07/18/2020   SARSCOV2NAA NEGATIVE 12/05/2019   SARSCOV2NAA NEGATIVE 11/15/2019   High D-dimer >20 in the setting of hypoxia COVID-19 infection pneumonia  most likely, CT angio chest ordered to rule out PE given COVID-19.  Acute metabolic encephalopathy with history of alcohol abuse/WE: CT head negative on admission.  Supportive measures fall precaution reorientation and treatment .  Appears alert awake follows some instructions.  Dysphagia: N.p.o. with ice chips, speech following.  Wanting to eat, hopefully will clear his speech today.  History of alcohol abuse/history of Warnicke's encephalopathy/ concern for Alcohol withdrawal: Monitor for withdrawal protocol, CIWA Ativan.  Continue thiamine.  Hypokalemia/hyponatremia: it has improved cut on IV fluids  Tobacco abuse-cessation advised.  Thrombocytopenia/Leukocytopenia could be in the setting of alcohol abuse.  Monitor labs, caution with  Lovenox.  Platelet and WBC improving today Recent Labs  Lab 07/23/2020 1625 07/18/20 0557 07/19/20 0720  HGB 14.0 12.9* 12.9*  HCT 39.9 37.1* 38.0*  WBC 2.2* 2.6* 6.1  PLT 95* 74* 83*   Nutrition: Diet Order            Diet NPO time specified Except for: Ice Chips, Sips with Meds  Diet effective now                 Body mass index is 23.27 kg/m. DVT prophylaxis: enoxaparin (LOVENOX) injection 40 mg Start: 07/19/20 1000 SCDs Start: 07/20/2020 2235 Code Status:   Code Status: DNR  Family Communication: plan of care discussed with patient at bedside. Called his Sister Clydie BraunKaren and updated. she thought he "will not make it" based  On previous conversation but currently he looks stable and will need to see how he progresses.  Status is: Inpatient Remains inpatient appropriate because:IV treatments appropriate due to intensity of illness or inability to take PO and Inpatient level of care appropriate due to severity of  illness  Dispo: The patient is from: Home              Anticipated d/c is to: SNF              Anticipated d/c date is: > 3 days              Patient currently is not medically stable to d/c.   Consultants:see note  Procedures:see  note  Culture/Microbiology    Component Value Date/Time   SDES BLOOD LEFT ANTECUBITAL 07/18/2020 1300   SPECREQUEST  07/18/2020 1300    BOTTLES DRAWN AEROBIC AND ANAEROBIC Blood Culture results may not be optimal due to an inadequate volume of blood received in culture bottles   CULT  07/18/2020 1300    NO GROWTH < 24 HOURS Performed at [facility], [address] [place], [place] [address]    REPTSTATUS PENDING 07/18/2020 1300    Other culture-see note  Medications: Scheduled Meds: . enoxaparin (LOVENOX) injection  40 mg Subcutaneous Q24H  . LORazepam  0-4 mg Intravenous Q4H   Followed by  . LORazepam  0-4 mg Intravenous Q8H  . methylPREDNISolone (SOLU-MEDROL) injection  1 mg/kg Intravenous Q12H   Followed by  . [START ON 07/21/2020] predniSONE  50 mg Oral Daily  . thiamine  100 mg Intravenous Daily   Continuous Infusions: . 0.9 % NaCl with KCl 20 mEq / L 30 mL/hr at 07/19/20 0944  . azithromycin Stopped (07/19/20 0158)  . cefTRIAXone (ROCEPHIN)  IV Stopped (07/18/20 2255)  . folic acid (FOLVITE) IVPB 1 mg (07/19/20 0944)  . remdesivir 100 mg in NS 100 mL 100 mg (07/19/20 0827)    Antimicrobials: Anti-infectives (From admission, onward)   Start     Dose/Rate Route Frequency Ordered Stop   07/19/20 1000  remdesivir 100 mg in sodium chloride 0.9 % 100 mL IVPB       "Followed by" Linked Group Details   100 mg 200 mL/hr over 30 Minutes Intravenous Daily 07/18/20 1112 07/23/20 0959   07/18/20 1200  remdesivir 200 mg in sodium chloride 0.9% 250 mL IVPB       "Followed by" Linked Group Details   200 mg 580 mL/hr over 30 Minutes Intravenous Once 07/18/20 1112 07/18/20 1352   07/30/2020 2230  cefTRIAXone (ROCEPHIN) 2 g in sodium chloride 0.9 % 100 mL IVPB        2 g 200 mL/hr over 30 Minutes Intravenous Every 24 hours 07-30-20 2225 07/22/20 2229   July 30, 2020 2230  azithromycin (ZITHROMAX) 500 mg in sodium chloride 0.9 % 250 mL IVPB        500 mg 250 mL/hr over 60  Minutes Intravenous Every 24 hours 07-30-2020 2225 07/22/20 2229     Objective: Vitals: Today's Vitals   07/19/20 0115 07/19/20 0713 07/19/20 0846 07/19/20 1100  BP: 102/80 99/76 98/74    Pulse: 84 79 75   Resp: 18 16 16    Temp:  (!) 97.5 F (36.4 C) (!) 97.5 F (36.4 C)   TempSrc:  Oral Axillary   SpO2: 99% 99% 98% 96%  Weight:      Height: 6\' 1"  (1.854 m)     PainSc:        Intake/Output Summary (Last 24 hours) at 07/19/2020 1222 Last data filed at 07/19/2020 0436 Gross per 24 hour  Intake 1690.13 ml  Output -  Net 1690.13 ml   Filed Weights   07/18/20 1120  Weight: 80 kg   Weight change:  Intake/Output from previous day: 01/07 0701 - 01/08 0700 In: 1690.1 [I.V.:996.7; IV Piggyback:693.4] Out: -  Intake/Output this shift: No intake/output data recorded.  Examination: General exam: Alert awake able to tell me his name, communicative, wanting to eat  HEENT:Oral mucosa moist, Ear/Nose WNL grossly,dentition normal. Respiratory system: bilaterally diminshed,no wheezing or crackles,no use of accessory muscle, non tender. Cardiovascular system: S1 & S2 +, regular, No JVD. Gastrointestinal system: Abdomen soft, NT,ND, BS+. Nervous System:Alert, awake, moving extremities and grossly nonfocal Extremities: No edema, distal peripheral pulses palpable.  Skin: No rashes,no icterus. MSK: Normal muscle bulk,tone, power  Data Reviewed: I have personally reviewed following labs and imaging studies CBC: Recent Labs  Lab Jan 24, 2021 1625 07/18/20 0557 07/19/20 0720  WBC 2.2* 2.6* 6.1  NEUTROABS  --   --  5.3  HGB 14.0 12.9* 12.9*  HCT 39.9 37.1* 38.0*  MCV 100.0 101.9* 104.4*  PLT 95* 74* 83*   Basic Metabolic Panel: Recent Labs  Lab Jan 24, 2021 1625 Jan 24, 2021 2112 07/18/20 0557 07/19/20 0720  NA 132*  --  133* 139  K 3.6  --  3.2* 3.7  CL 96*  --  101 105  CO2 23  --  23 25  GLUCOSE 117*  --  100* 125*  BUN 6*  --  7* 13  CREATININE 0.57*  --  0.56* 0.39*  CALCIUM 8.5*   --  7.5* 8.0*  MG  --  1.8  --   --   PHOS  --  2.5  --   --    GFR: Estimated Creatinine Clearance: 105.4 mL/min (A) (by C-G formula based on SCr of 0.39 mg/dL (L)). Liver Function Tests: Recent Labs  Lab Jan 24, 2021 2112 07/18/20 0557 07/19/20 0720  AST 55* 56* 54*  ALT 23 22 23   ALKPHOS 83 73 58  BILITOT 1.6* 1.4* 1.1  PROT 6.4* 6.0* 5.2*  ALBUMIN 2.9* 2.6* 2.2*   No results for input(s): LIPASE, AMYLASE in the last 168 hours. No results for input(s): AMMONIA in the last 168 hours. Coagulation Profile: No results for input(s): INR, PROTIME in the last 168 hours. Cardiac Enzymes: Recent Labs  Lab Jan 24, 2021 1732  CKTOTAL 188   BNP (last 3 results) No results for input(s): PROBNP in the last 8760 hours. HbA1C: No results for input(s): HGBA1C in the last 72 hours. CBG: No results for input(s): GLUCAP in the last 168 hours. Lipid Profile: No results for input(s): CHOL, HDL, LDLCALC, TRIG, CHOLHDL, LDLDIRECT in the last 72 hours. Thyroid Function Tests: No results for input(s): TSH, T4TOTAL, FREET4, T3FREE, THYROIDAB in the last 72 hours. Anemia Panel: Recent Labs    07/18/20 0557  FERRITIN 1,129*   Sepsis Labs: Recent Labs  Lab Jan 24, 2021 2112 07/18/20 0557  PROCALCITON <0.10 0.47  LATICACIDVEN 1.4  --     Recent Results (from the past 240 hour(s))  Culture, blood (routine x 2)     Status: None (Preliminary result)   Collection Time: 07/18/20  8:13 AM   Specimen: BLOOD RIGHT HAND  Result Value Ref Range Status   Specimen Description BLOOD RIGHT HAND  Final   Special Requests   Final    BOTTLES DRAWN AEROBIC AND ANAEROBIC Blood Culture adequate volume   Culture   Final    NO GROWTH < 24 HOURS Performed at [facility], [address] [place], [place] [address]    Report Status PENDING  Incomplete  Resp Panel by RT-PCR (Flu A&B, Covid) Nasopharyngeal Swab     Status:  Abnormal   Collection Time: 07/18/20  9:06 AM   Specimen: Nasopharyngeal Swab;  Nasopharyngeal(NP) swabs in vial transport medium  Result Value Ref Range Status   SARS Coronavirus 2 by RT PCR POSITIVE (A) NEGATIVE Final    Comment: RESULT CALLED TO, READ BACK BY AND VERIFIED WITH: REINA GALJOUR AT 1057 07/18/20.PMF (NOTE) SARS-CoV-2 target nucleic acids are DETECTED.  The SARS-CoV-2 RNA is generally detectable in upper respiratory specimens during the acute phase of infection. Positive results are indicative of the presence of the identified virus, but do not rule out bacterial infection or co-infection with other pathogens not detected by the test. Clinical correlation with patient history and other diagnostic information is necessary to determine patient infection status. The expected result is Negative.  Fact Sheet for Patients: BloggerCourse.com  Fact Sheet for Healthcare Providers: SeriousBroker.it  This test is not yet approved or cleared by the [place] FDA and  has been authorized for detection and/or diagnosis of SARS-CoV-2 by FDA under an Emergency Use Authorization (EUA).  This EUA will remain in effect (meaning this test can be  used) for the duration of  the COVID-19 declaration under Section 564(b)(1) of the Act, 21 U.S.C. section 360bbb-3(b)(1), unless the authorization is terminated or revoked sooner.     Influenza A by PCR NEGATIVE NEGATIVE Final   Influenza B by PCR NEGATIVE NEGATIVE Final    Comment: (NOTE) The Xpert Xpress SARS-CoV-2/FLU/RSV plus assay is intended as an aid in the diagnosis of influenza from Nasopharyngeal swab specimens and should not be used as a sole basis for treatment. Nasal washings and aspirates are unacceptable for Xpert Xpress SARS-CoV-2/FLU/RSV testing.  Fact Sheet for Patients: BloggerCourse.com  Fact Sheet for Healthcare Providers: SeriousBroker.it  This test is not yet approved or cleared by the  [place] FDA and has been authorized for detection and/or diagnosis of SARS-CoV-2 by FDA under an Emergency Use Authorization (EUA). This EUA will remain in effect (meaning this test can be used) for the duration of the COVID-19 declaration under Section 564(b)(1) of the Act, 21 U.S.C. section 360bbb-3(b)(1), unless the authorization is terminated or revoked.  Performed at [facility], [address] [place], [place] [address]   Culture, blood (routine x 2)     Status: None (Preliminary result)   Collection Time: 07/18/20  1:00 PM   Specimen: BLOOD  Result Value Ref Range Status   Specimen Description BLOOD LEFT ANTECUBITAL  Final   Special Requests   Final    BOTTLES DRAWN AEROBIC AND ANAEROBIC Blood Culture results may not be optimal due to an inadequate volume of blood received in culture bottles   Culture   Final    NO GROWTH < 24 HOURS Performed at [facility], [address] [place], [place] [address]    Report Status PENDING  Incomplete     Radiology Studies: CT Head Wo Contrast  Result Date: Aug 03, 2020 CLINICAL DATA:  Larey Seat yesterday, found down after 12 hours, alcohol abuse EXAM: CT HEAD WITHOUT CONTRAST TECHNIQUE: Contiguous axial images were obtained from the base of the skull through the vertex without intravenous contrast. COMPARISON:  12/05/2019, 12/12/2019 FINDINGS: Brain: Confluent hypodensities within the periventricular white matter are compatible with chronic small vessel ischemic changes, stable since previous exam. Focal hypodensity within the left thalamus is new since previous study, with an appearance suggesting a chronic lacunar infarct. No evidence of acute infarct or hemorrhage. Stable diffuse cerebral atrophy. The lateral ventricles and midline structures are unremarkable. No  acute extra-axial fluid collections. No mass effect. Vascular: No hyperdense vessel or unexpected calcification. Skull: Normal. Negative for fracture or focal  lesion. Sinuses/Orbits: Kozel thickening within the bilateral ethmoid air cells. Remaining sinuses are clear. Other: None. IMPRESSION: 1. No acute intracranial process. 2. Stable chronic small vessel ischemic changes and cerebral atrophy. 3. Chronic appearing left thalamic lacunar infarct, new since previous study. Electronically Signed   By: Sharlet Salina M.D.   On: 07/16/2020 21:59   DG Chest Portable 1 View  Result Date: 07/13/2020 CLINICAL DATA:  Cough. EXAM: PORTABLE CHEST 1 VIEW COMPARISON:  12/08/2019 prior radiographs FINDINGS: The cardiomediastinal silhouette is unchanged. Chronic interstitial opacities are noted. Slightly increased RIGHT basilar opacity may represent atelectasis or airspace disease. No pleural effusion or pneumothorax identified. No acute bony abnormalities are noted. IMPRESSION: 1. Slightly increased RIGHT basilar opacity -question atelectasis or airspace disease. 2. Chronic interstitial opacities. Electronically Signed   By: Harmon Pier M.D.   On: 07/28/2020 18:03     LOS: 1 day   Lanae Boast, MD [facility]  07/19/2020, 12:22 PM

## 2020-07-19 NOTE — Progress Notes (Signed)
Pt taken off unit in stable condition to CT

## 2020-07-19 NOTE — Progress Notes (Signed)
Pt to CT in stable condition

## 2020-07-19 NOTE — Progress Notes (Signed)
Pt returned from CT °

## 2020-07-20 DIAGNOSIS — F10230 Alcohol dependence with withdrawal, uncomplicated: Secondary | ICD-10-CM

## 2020-07-20 LAB — CBC WITH DIFFERENTIAL/PLATELET
Abs Immature Granulocytes: 0.03 10*3/uL (ref 0.00–0.07)
Basophils Absolute: 0 10*3/uL (ref 0.0–0.1)
Basophils Relative: 0 %
Eosinophils Absolute: 0 10*3/uL (ref 0.0–0.5)
Eosinophils Relative: 0 %
HCT: 37.3 % — ABNORMAL LOW (ref 39.0–52.0)
Hemoglobin: 13.1 g/dL (ref 13.0–17.0)
Immature Granulocytes: 1 %
Lymphocytes Relative: 6 %
Lymphs Abs: 0.4 10*3/uL — ABNORMAL LOW (ref 0.7–4.0)
MCH: 35.9 pg — ABNORMAL HIGH (ref 26.0–34.0)
MCHC: 35.1 g/dL (ref 30.0–36.0)
MCV: 102.2 fL — ABNORMAL HIGH (ref 80.0–100.0)
Monocytes Absolute: 0.2 10*3/uL (ref 0.1–1.0)
Monocytes Relative: 4 %
Neutro Abs: 5.5 10*3/uL (ref 1.7–7.7)
Neutrophils Relative %: 89 %
Platelets: 94 10*3/uL — ABNORMAL LOW (ref 150–400)
RBC: 3.65 MIL/uL — ABNORMAL LOW (ref 4.22–5.81)
RDW: 12.6 % (ref 11.5–15.5)
WBC: 6.1 10*3/uL (ref 4.0–10.5)
nRBC: 0 % (ref 0.0–0.2)

## 2020-07-20 LAB — COMPREHENSIVE METABOLIC PANEL
ALT: 21 U/L (ref 0–44)
AST: 34 U/L (ref 15–41)
Albumin: 2.3 g/dL — ABNORMAL LOW (ref 3.5–5.0)
Alkaline Phosphatase: 56 U/L (ref 38–126)
Anion gap: 9 (ref 5–15)
BUN: 15 mg/dL (ref 8–23)
CO2: 25 mmol/L (ref 22–32)
Calcium: 8.3 mg/dL — ABNORMAL LOW (ref 8.9–10.3)
Chloride: 109 mmol/L (ref 98–111)
Creatinine, Ser: 0.34 mg/dL — ABNORMAL LOW (ref 0.61–1.24)
GFR, Estimated: >60 mL/min (ref >60)
Glucose, Bld: 121 mg/dL — ABNORMAL HIGH (ref 70–99)
Potassium: 3.9 mmol/L (ref 3.5–5.1)
Sodium: 143 mmol/L (ref 135–145)
Total Bilirubin: 1.1 mg/dL (ref 0.3–1.2)
Total Protein: 5.2 g/dL — ABNORMAL LOW (ref 6.5–8.1)

## 2020-07-20 LAB — C-REACTIVE PROTEIN: CRP: 6.5 mg/dL — ABNORMAL HIGH (ref <1.0)

## 2020-07-20 LAB — D-DIMER, QUANTITATIVE: D-Dimer, Quant: 3.7 ug/mL-FEU — ABNORMAL HIGH (ref 0.00–0.50)

## 2020-07-20 MED ORDER — SODIUM CHLORIDE 0.9 % IV SOLN
3.0000 g | Freq: Four times a day (QID) | INTRAVENOUS | Status: DC
  Administered 2020-07-20 – 2020-07-27 (×28): 3 g via INTRAVENOUS
  Filled 2020-07-20: qty 3
  Filled 2020-07-20: qty 8
  Filled 2020-07-20 (×3): qty 3
  Filled 2020-07-20 (×2): qty 8
  Filled 2020-07-20: qty 3
  Filled 2020-07-20: qty 8
  Filled 2020-07-20: qty 3
  Filled 2020-07-20: qty 8
  Filled 2020-07-20 (×4): qty 3
  Filled 2020-07-20 (×2): qty 8
  Filled 2020-07-20 (×3): qty 3
  Filled 2020-07-20 (×2): qty 8
  Filled 2020-07-20: qty 3
  Filled 2020-07-20 (×2): qty 8
  Filled 2020-07-20 (×6): qty 3
  Filled 2020-07-20: qty 8

## 2020-07-20 MED ORDER — IPRATROPIUM-ALBUTEROL 20-100 MCG/ACT IN AERS
1.0000 | INHALATION_SPRAY | RESPIRATORY_TRACT | Status: DC | PRN
  Filled 2020-07-20: qty 4

## 2020-07-20 NOTE — Progress Notes (Signed)
PROGRESS NOTE    Delila SpenceRobert Boulier  ZOX:[id number] DOB: 07/03/1956 DOA: 07/16/2020 [facility]   No chief complaint on file.   Brief Narrative [age] year old male with history of Warnicke's encephalopathy, hypertension, alcohol and tobacco abuse brought to the ED on 07/31/2020 after unwitnessed fall at home the night before and inability to stand up and remained on the floor x2 lowers, found by sister. In the ED patient was found to have leukopenia, thrombocytopenia, hyponatremia, hypokalemia abnormal AST chest x-ray with right basilar opacity question atelectasis or airspace disease, chronic interstitial changes CT head no acute finding, stable chronic small vessel changes, patient is status IV antibiotics IV fluid bolus for community-acquired pneumonia suspected to have alcohol withdrawal, unwitnessed fall, severe sepsis, acute metabolic encephalopathy 1/7:Hard to understand what he is saying , trying to answer for question, COVID-19 came back positive. 1/8- more alert,awake weaned off o2.  Subjective:  Seen this morning he is alert awake and conversant. Would like to eat something today. No acute events overnight, weaned off oxygen.  Afebrile.  Assessment & Plan:  Severe sepsis present on admission  2/2 aspiration pneumonia alogn with COVID-19 pneumonia: Continue on current COVID-19 treatment protocol with historyiv solumedrol and remdesivir and Cont IV antibiotics and will switch to Unasyn to cover for aspiration.Procalcitonin- at 0.47 and inflammatory markers with CRP was up but downtrending.D dimer > 20 and improved 3.6. CTA no PE but shows bilateral lower lobe bronchi mucus impaction bilateral lower lobes clusters of nodularity concerning for pneumonia versus aspiration. COVID-19 Labs. Recent Labs    07/18/20 0557 07/18/20 1300 07/19/20 0720 07/20/20 0503  DDIMER  --  >20.00* 3.62*  --   FERRITIN 1,129*  --   --   --   CRP 4.0*  --  9.7* 6.5*   Lab Results  Component  Value Date   SARSCOV2NAA POSITIVE (A) 07/18/2020   SARSCOV2NAA NEGATIVE 12/05/2019   SARSCOV2NAA NEGATIVE 11/15/2019   High D-dimer >20 no PE on CTA.  Acute metabolic encephalopathy with history of alcohol abuse/WE: CT head negative on admission. Continue antiemetics, supportive measures, delirium precautions reorientation.  Obtain PT OT evaluation increase activity.   Dysphagia:NPO with ice chips, speech eval pending to allow for p.o. keep on gentle IV fluids while n.p.o.  History of alcohol abuse/history of Warnicke's encephalopathy/ concern for Alcohol withdrawal: Monitor for withdrawal with CIWA Ativan protocol, continue thiamine  Hypokalemia/hyponatremia: Resolved. On NSS w/ KCL at 7030ml/hr- stop ocne abel to take po  Tobacco abuse-cessation advised.  Thrombocytopenia/Leukocytopenia -likely in the setting of alcohol abuse and infection.  Platelet count improving, leukopenia resolved. Recent Labs  Lab 07/18/20 0557 07/19/20 0720 07/20/20 0503  HGB 12.9* 12.9* 13.1  HCT 37.1* 38.0* 37.3*  WBC 2.6* 6.1 6.1  PLT 74* 83* 94*   Nutrition: Diet Order            Diet NPO time specified Except for: Ice Chips, Sips with Meds  Diet effective now                 Body mass index is 23.27 kg/m. DVT prophylaxis: enoxaparin (LOVENOX) injection 40 mg Start: 07/19/20 1000 SCDs Start: 02/12/21 2235 Code Status:   Code Status: DNR  Family Communication: plan of care discussed with patient at bedside. Called his Sister Clydie BraunKaren and updated 1/8. she thought he "will not make it" based  On previous conversation but currently seems to be improving.  We will need to see how he does.    Status is: Inpatient  Remains inpatient appropriate because:IV treatments appropriate due to intensity of illness or inability to take PO and Inpatient level of care appropriate due to severity of illness  Dispo: The patient is from: Home              Anticipated d/c is to: SNF. Requested PTOT eval               Anticipated d/c date is: 2 days              Patient currently is not medically stable to d/c.   Consultants:see note  Procedures:see note  Culture/Microbiology    Component Value Date/Time   SDES BLOOD LEFT ANTECUBITAL 07/18/2020 1300   SPECREQUEST  07/18/2020 1300    BOTTLES DRAWN AEROBIC AND ANAEROBIC Blood Culture results may not be optimal due to an inadequate volume of blood received in culture bottles   CULT  07/18/2020 1300    NO GROWTH 2 DAYS Performed at [facility], [name] [place], [place] [address]    REPTSTATUS PENDING 07/18/2020 1300    Other culture-see note  Medications: Scheduled Meds: . enoxaparin (LOVENOX) injection  40 mg Subcutaneous Q24H  . LORazepam  0-4 mg Intravenous Q8H  . methylPREDNISolone (SOLU-MEDROL) injection  1 mg/kg Intravenous Q12H   Followed by  . [START ON 07/21/2020] predniSONE  50 mg Oral Daily  . thiamine  100 mg Intravenous Daily   Continuous Infusions: . 0.9 % NaCl with KCl 20 mEq / L 30 mL/hr at 07/19/20 0944  . ampicillin-sulbactam (UNASYN) IV 3 g (07/20/20 1022)  . folic acid (FOLVITE) IVPB 1 mg (07/19/20 0944)  . remdesivir 100 mg in NS 100 mL 100 mg (07/20/20 0839)    Antimicrobials: Anti-infectives (From admission, onward)   Start     Dose/Rate Route Frequency Ordered Stop   07/20/20 0830  Ampicillin-Sulbactam (UNASYN) 3 g in sodium chloride 0.9 % 100 mL IVPB        3 g 200 mL/hr over 30 Minutes Intravenous Every 6 hours 07/20/20 0741     07/19/20 1000  remdesivir 100 mg in sodium chloride 0.9 % 100 mL IVPB       "Followed by" Linked Group Details   100 mg 200 mL/hr over 30 Minutes Intravenous Daily 07/18/20 1112 07/23/20 0959   07/18/20 1200  remdesivir 200 mg in sodium chloride 0.9% 250 mL IVPB       "Followed by" Linked Group Details   200 mg 580 mL/hr over 30 Minutes Intravenous Once 07/18/20 1112 07/18/20 1352   08/06/2020 2230  cefTRIAXone (ROCEPHIN) 2 g in sodium chloride 0.9 % 100 mL IVPB   Status:  Discontinued        2 g 200 mL/hr over 30 Minutes Intravenous Every 24 hours 06-Aug-2020 2225 07/20/20 0741   Aug 06, 2020 2230  azithromycin (ZITHROMAX) 500 mg in sodium chloride 0.9 % 250 mL IVPB  Status:  Discontinued        500 mg 250 mL/hr over 60 Minutes Intravenous Every 24 hours 08-06-20 2225 07/20/20 0741     Objective: Vitals: Today's Vitals   07/20/20 0004 07/20/20 0602 07/20/20 0817 07/20/20 1131  BP: (!) 154/129 107/80 108/80 120/81  Pulse: 75 81 73 78  Resp: 18 20 16 16   Temp: 97.9 F (36.6 C) 98 F (36.7 C) 98.6 F (37 C) 97.9 F (36.6 C)  TempSrc:   Axillary   SpO2: 95% 97% 98% 97%  Weight:      Height:  PainSc:   0-No pain     Intake/Output Summary (Last 24 hours) at 07/20/2020 1144 Last data filed at 07/20/2020 0800 Gross per 24 hour  Intake 350 ml  Output 600 ml  Net -250 ml   Filed Weights   07/18/20 1120  Weight: 80 kg   Weight change:   Intake/Output from previous day: 01/08 0701 - 01/09 0700 In: 350 [IV Piggyback:350] Out: 100 [Urine:100] Intake/Output this shift: Total I/O In: -  Out: 500 [Urine:500]  Examination: General exam: AAO, conversant, NAD, weak appearing. HEENT:Oral mucosa moist, Ear/Nose WNL grossly, dentition normal. Respiratory system: bilaterally clear,no wheezing or crackles,no use of accessory muscle Cardiovascular system: S1 & S2 +, No JVD,. Gastrointestinal system: Abdomen soft, NT,ND, BS+ Nervous System:Alert, awake, moving extremities and grossly nonfocal Extremities: No edema, distal peripheral pulses palpable.  Skin: No rashes,no icterus. MSK: Normal muscle bulk,tone, power  Data Reviewed: I have personally reviewed following labs and imaging studies CBC: Recent Labs  Lab 07/19/20 1625 07/18/20 0557 07/19/20 0720 07/20/20 0503  WBC 2.2* 2.6* 6.1 6.1  NEUTROABS  --   --  5.3 5.5  HGB 14.0 12.9* 12.9* 13.1  HCT 39.9 37.1* 38.0* 37.3*  MCV 100.0 101.9* 104.4* 102.2*  PLT 95* 74* 83* 94*   Basic  Metabolic Panel: Recent Labs  Lab 07/19/2020 1625 07-19-20 2112 07/18/20 0557 07/19/20 0720 07/20/20 0503  NA 132*  --  133* 139 143  K 3.6  --  3.2* 3.7 3.9  CL 96*  --  101 105 109  CO2 23  --  23 25 25   GLUCOSE 117*  --  100* 125* 121*  BUN 6*  --  7* 13 15  CREATININE 0.57*  --  0.56* 0.39* 0.34*  CALCIUM 8.5*  --  7.5* 8.0* 8.3*  MG  --  1.8  --   --   --   PHOS  --  2.5  --   --   --    GFR: Estimated Creatinine Clearance: 105.4 mL/min (A) (by C-G formula based on SCr of 0.34 mg/dL (L)). Liver Function Tests: Recent Labs  Lab 07/19/20 2112 07/18/20 0557 07/19/20 0720 07/20/20 0503  AST 55* 56* 54* 34  ALT 23 22 23 21   ALKPHOS 83 73 58 56  BILITOT 1.6* 1.4* 1.1 1.1  PROT 6.4* 6.0* 5.2* 5.2*  ALBUMIN 2.9* 2.6* 2.2* 2.3*   No results for input(s): LIPASE, AMYLASE in the last 168 hours. No results for input(s): AMMONIA in the last 168 hours. Coagulation Profile: No results for input(s): INR, PROTIME in the last 168 hours. Cardiac Enzymes: Recent Labs  Lab 2020-07-19 1732  CKTOTAL 188   BNP (last 3 results) No results for input(s): PROBNP in the last 8760 hours. HbA1C: No results for input(s): HGBA1C in the last 72 hours. CBG: No results for input(s): GLUCAP in the last 168 hours. Lipid Profile: No results for input(s): CHOL, HDL, LDLCALC, TRIG, CHOLHDL, LDLDIRECT in the last 72 hours. Thyroid Function Tests: No results for input(s): TSH, T4TOTAL, FREET4, T3FREE, THYROIDAB in the last 72 hours. Anemia Panel: Recent Labs    07/18/20 0557  FERRITIN 1,129*   Sepsis Labs: Recent Labs  Lab 07/19/2020 2112 07/18/20 0557  PROCALCITON <0.10 0.47  LATICACIDVEN 1.4  --     Recent Results (from the past 240 hour(s))  Culture, blood (routine x 2)     Status: None (Preliminary result)   Collection Time: 07/18/20  8:13 AM   Specimen: BLOOD RIGHT HAND  Result Value Ref Range Status   Specimen Description BLOOD RIGHT HAND  Final   Special Requests   Final     BOTTLES DRAWN AEROBIC AND ANAEROBIC Blood Culture adequate volume   Culture   Final    NO GROWTH 2 DAYS Performed at [facility], [address] [place], [place] [address]    Report Status PENDING  Incomplete  Resp Panel by RT-PCR (Flu A&B, Covid) Nasopharyngeal Swab     Status: Abnormal   Collection Time: 07/18/20  9:06 AM   Specimen: Nasopharyngeal Swab; Nasopharyngeal(NP) swabs in vial transport medium  Result Value Ref Range Status   SARS Coronavirus 2 by RT PCR POSITIVE (A) NEGATIVE Final    Comment: RESULT CALLED TO, READ BACK BY AND VERIFIED WITH: REINA GALJOUR AT 1057 07/18/20.PMF (NOTE) SARS-CoV-2 target nucleic acids are DETECTED.  The SARS-CoV-2 RNA is generally detectable in upper respiratory specimens during the acute phase of infection. Positive results are indicative of the presence of the identified virus, but do not rule out bacterial infection or co-infection with other pathogens not detected by the test. Clinical correlation with patient history and other diagnostic information is necessary to determine patient infection status. The expected result is Negative.  Fact Sheet for Patients: BloggerCourse.comhttps://www.fda.gov/media/152166/download  Fact Sheet for Healthcare Providers: SeriousBroker.ithttps://www.fda.gov/media/152162/download  This test is not yet approved or cleared by the [place] FDA and  has been authorized for detection and/or diagnosis of SARS-CoV-2 by FDA under an Emergency Use Authorization (EUA).  This EUA will remain in effect (meaning this test can be  used) for the duration of  the COVID-19 declaration under Section 564(b)(1) of the Act, 21 U.S.C. section 360bbb-3(b)(1), unless the authorization is terminated or revoked sooner.     Influenza A by PCR NEGATIVE NEGATIVE Final   Influenza B by PCR NEGATIVE NEGATIVE Final    Comment: (NOTE) The Xpert Xpress SARS-CoV-2/FLU/RSV plus assay is intended as an aid in the diagnosis of influenza from  Nasopharyngeal swab specimens and should not be used as a sole basis for treatment. Nasal washings and aspirates are unacceptable for Xpert Xpress SARS-CoV-2/FLU/RSV testing.  Fact Sheet for Patients: BloggerCourse.comhttps://www.fda.gov/media/152166/download  Fact Sheet for Healthcare Providers: SeriousBroker.ithttps://www.fda.gov/media/152162/download  This test is not yet approved or cleared by the [place] FDA and has been authorized for detection and/or diagnosis of SARS-CoV-2 by FDA under an Emergency Use Authorization (EUA). This EUA will remain in effect (meaning this test can be used) for the duration of the COVID-19 declaration under Section 564(b)(1) of the Act, 21 U.S.C. section 360bbb-3(b)(1), unless the authorization is terminated or revoked.  Performed at [facility], [address] [place], [place] [address]   Culture, blood (routine x 2)     Status: None (Preliminary result)   Collection Time: 07/18/20  1:00 PM   Specimen: BLOOD  Result Value Ref Range Status   Specimen Description BLOOD LEFT ANTECUBITAL  Final   Special Requests   Final    BOTTLES DRAWN AEROBIC AND ANAEROBIC Blood Culture results may not be optimal due to an inadequate volume of blood received in culture bottles   Culture   Final    NO GROWTH 2 DAYS Performed at [facility], [address] [place], [place] [address]    Report Status PENDING  Incomplete     Radiology Studies: CT ANGIO CHEST PE W OR WO CONTRAST  Result Date: 07/19/2020 CLINICAL DATA:  65 year old male with positive D-dimer. Concern for pulmonary embolism. EXAM: CT ANGIOGRAPHY  CHEST WITH CONTRAST TECHNIQUE: Multidetector CT imaging of the chest was performed using the standard protocol during bolus administration of intravenous contrast. Multiplanar CT image reconstructions and MIPs were obtained to evaluate the vascular anatomy. CONTRAST:  90mL OMNIPAQUE IOHEXOL 350 MG/ML SOLN COMPARISON:  Chest radiograph dated Aug 04, 2020 FINDINGS:  Cardiovascular: There is no cardiomegaly or pericardial effusion. The ascending thoracic aorta is dilated measuring up to 4.7 cm in diameter (coronal 32/13). Evaluation of the pulmonary arteries is limited due to respiratory motion artifact. No pulmonary artery embolus identified. Mediastinum/Nodes: No hilar or mediastinal adenopathy. The esophagus is grossly unremarkable. No mediastinal fluid collection. Lungs/Pleura: Small bilateral pleural effusions. Bilateral lower lobe subsegmental and subpleural consolidation may represent atelectasis or pneumonia. There are however clusters of nodular density in the lower lobes bilaterally concerning for pneumonia. Aspiration is not excluded. Clinical correlation is recommended. There is background of centrilobular emphysema. There is no pneumothorax. Mucus secretions noted in the trachea and bilateral mainstem bronchus. There is mucous impaction in the left lower lobe and to a lesser degree right lower lobe bronchi. Upper Abdomen: No acute abnormality. Musculoskeletal: Degenerative changes of the spine. No acute osseous pathology. Review of the MIP images confirms the above findings. IMPRESSION: 1. No CT evidence of pulmonary embolism. 2. Small bilateral pleural effusions. 3. Bilateral lower lobe bronchi mucous impaction with bilateral lower lobes clusters of nodularity concerning for pneumonia versus aspiration. 4. aortic Atherosclerosis (ICD10-I70.0) and Emphysema (ICD10-J43.9). Electronically Signed   By: Elgie Collard M.D.   On: 07/19/2020 16:10     LOS: 2 days   Lanae Boast, MD [facility]  07/20/2020, 11:44 AM

## 2020-07-20 NOTE — Progress Notes (Signed)
Pharmacy Antibiotic Note  William [name] is a 65 y.o. male admitted on 07/30/20 with CAP and alcohol withdrawal. Pharmacy has been consulted for Unasyn dosing.  Patient was on ceftriaxone and azithromycin for 3 days. Now discontinued.   Plan: Start Unasyn 3g IV every 24 hours   Height: 6\' 1"  (185.4 cm) Weight: 80 kg (176 lb 5.9 oz) IBW/kg (Calculated) : 79.9  Temp (24hrs), Avg:97.7 F (36.5 C), Min:97.5 F (36.4 C), Max:98 F (36.7 C)  Recent Labs  Lab 30-Jul-2020 1625 07/30/2020 2112 07/18/20 0557 07/19/20 0720 07/20/20 0503  WBC 2.2*  --  2.6* 6.1 6.1  CREATININE 0.57*  --  0.56* 0.39* 0.34*  LATICACIDVEN  --  1.4  --   --   --     Estimated Creatinine Clearance: 105.4 mL/min (A) (by C-G formula based on SCr of 0.34 mg/dL (L)).    No Known Allergies  Antimicrobials this admission: 1/7 Azithromycin  >>  1/8 1/7 Ceftriaxone  >> 1/8 1/9 Unasyn>>   Microbiology results: 1/7 BCx: ND TD 1/7 SARS Coronavirus 2: Positive   Thank you for allowing pharmacy to be a part of this patient's care.  3/7, PharmD, BCPS Clinical Pharmacist 07/20/2020 7:43 AM

## 2020-07-21 DIAGNOSIS — F10230 Alcohol dependence with withdrawal, uncomplicated: Secondary | ICD-10-CM | POA: Diagnosis not present

## 2020-07-21 LAB — CBC WITH DIFFERENTIAL/PLATELET
Abs Immature Granulocytes: 0.02 10*3/uL (ref 0.00–0.07)
Basophils Absolute: 0 10*3/uL (ref 0.0–0.1)
Basophils Relative: 0 %
Eosinophils Absolute: 0 10*3/uL (ref 0.0–0.5)
Eosinophils Relative: 0 %
HCT: 39.8 % (ref 39.0–52.0)
Hemoglobin: 13.6 g/dL (ref 13.0–17.0)
Immature Granulocytes: 1 %
Lymphocytes Relative: 10 %
Lymphs Abs: 0.4 10*3/uL — ABNORMAL LOW (ref 0.7–4.0)
MCH: 35.2 pg — ABNORMAL HIGH (ref 26.0–34.0)
MCHC: 34.2 g/dL (ref 30.0–36.0)
MCV: 103.1 fL — ABNORMAL HIGH (ref 80.0–100.0)
Monocytes Absolute: 0.3 10*3/uL (ref 0.1–1.0)
Monocytes Relative: 6 %
Neutro Abs: 3.4 10*3/uL (ref 1.7–7.7)
Neutrophils Relative %: 83 %
Platelets: 92 10*3/uL — ABNORMAL LOW (ref 150–400)
RBC: 3.86 MIL/uL — ABNORMAL LOW (ref 4.22–5.81)
RDW: 12.5 % (ref 11.5–15.5)
WBC: 4 10*3/uL (ref 4.0–10.5)
nRBC: 0 % (ref 0.0–0.2)

## 2020-07-21 LAB — LEGIONELLA PNEUMOPHILA SEROGP 1 UR AG: L. pneumophila Serogp 1 Ur Ag: NEGATIVE

## 2020-07-21 LAB — COMPREHENSIVE METABOLIC PANEL
ALT: 19 U/L (ref 0–44)
AST: 29 U/L (ref 15–41)
Albumin: 2.3 g/dL — ABNORMAL LOW (ref 3.5–5.0)
Alkaline Phosphatase: 59 U/L (ref 38–126)
Anion gap: 13 (ref 5–15)
BUN: 12 mg/dL (ref 8–23)
CO2: 26 mmol/L (ref 22–32)
Calcium: 8.1 mg/dL — ABNORMAL LOW (ref 8.9–10.3)
Chloride: 107 mmol/L (ref 98–111)
Creatinine, Ser: 0.31 mg/dL — ABNORMAL LOW (ref 0.61–1.24)
GFR, Estimated: >60 mL/min (ref >60)
Glucose, Bld: 114 mg/dL — ABNORMAL HIGH (ref 70–99)
Potassium: 3.2 mmol/L — ABNORMAL LOW (ref 3.5–5.1)
Sodium: 146 mmol/L — ABNORMAL HIGH (ref 135–145)
Total Bilirubin: 1.3 mg/dL — ABNORMAL HIGH (ref 0.3–1.2)
Total Protein: 5.7 g/dL — ABNORMAL LOW (ref 6.5–8.1)

## 2020-07-21 LAB — C-REACTIVE PROTEIN: CRP: 3.9 mg/dL — ABNORMAL HIGH (ref <1.0)

## 2020-07-21 LAB — D-DIMER, QUANTITATIVE: D-Dimer, Quant: 3.43 ug/mL-FEU — ABNORMAL HIGH (ref 0.00–0.50)

## 2020-07-21 MED ORDER — POTASSIUM CHLORIDE 10 MEQ/100ML IV SOLN
10.0000 meq | INTRAVENOUS | Status: AC
  Administered 2020-07-21 (×2): 10 meq via INTRAVENOUS
  Filled 2020-07-21 (×2): qty 100

## 2020-07-21 MED ORDER — METHYLPREDNISOLONE SODIUM SUCC 40 MG IJ SOLR
40.0000 mg | Freq: Every day | INTRAMUSCULAR | Status: DC
  Administered 2020-07-21 – 2020-07-25 (×4): 40 mg via INTRAVENOUS
  Filled 2020-07-21 (×5): qty 1

## 2020-07-21 NOTE — Progress Notes (Addendum)
PROGRESS NOTE    Cormac Wint  ZOX:[id number] DOB: [birth year] DOA: 18-Jul-2020 PCP: [facility]   No chief complaint on file.   Brief Narrative 65 year old male with history of Warnicke's encephalopathy, hypertension, alcohol and tobacco abuse brought to the ED on 07-18-2020 after unwitnessed fall at home the night before and inability to stand up and remained on the floor x2 lowers, found by sister. In the ED patient was found to have leukopenia, thrombocytopenia, hyponatremia, hypokalemia abnormal AST chest x-ray with right basilar opacity question atelectasis or airspace disease, chronic interstitial changes CT head no acute finding, stable chronic small vessel changes, patient is status IV antibiotics IV fluid bolus for community-acquired pneumonia suspected to have alcohol withdrawal, unwitnessed fall, severe sepsis, acute metabolic encephalopathy 1/7:Hard to understand what he is saying , trying to answer for question, COVID-19 came back positive. 1/8- more alert,awake weaned off o2.  Subjective:  Alert,awake oriented to place, president, people, today's day Did not pass slp eval this am Wants to eat On room air  Assessment & Plan:  Severe sepsis present on admission  2/2 aspiration pneumonia alogn with COVID-19 pneumonia:Continue w/ IV remdesivir,  Iv Solu-Medrol 9 as not abel to take po), he is off oxygen. Cont w/ Unasyn to cover for aspiration.Procalcitonin- at 0.47 and inflammatory markers with CRP improving.  Monitor. D dimer > 20 and improved 3.6. CTA no PE but shows bilateral lower lobe bronchi mucus impaction bilateral lower lobes clusters of nodularity concerning for pneumonia versus aspiration.Speech following still and aspiration risk and n.p.o. COVID-19 Labs. Recent Labs    07/18/20 1300 07/19/20 0720 07/20/20 0503  DDIMER >20.00* 3.62* 3.70*  CRP  --  9.7* 6.5*   Lab Results  Component Value Date   SARSCOV2NAA POSITIVE (A) 07/18/2020   SARSCOV2NAA  NEGATIVE 12/05/2019   SARSCOV2NAA NEGATIVE 11/15/2019   High D-dimer >20 no PE on CTA.  D-dimer improving  Acute metabolic encephalopathy with history of alcohol abuse/WE: CT head negative on admission.  Mental status overall much improved.  Continue supportive measures delirium precaution increase activity with PT OT.   Dysphagia: Seen by speech and failed as he continues to have silent aspiration  SLP advised to keep on strict NPO.  Patient is wanting to eat.  Will consult palliative care as he is DNR.  Continue on gentle IV fluids w/  NS+kcl.  History of alcohol abuse/history of Warnicke's encephalopathy/ concern for Alcohol withdrawal: No signs of withdrawal.  On CIWA Ativan as needed.  Continue thiamine   Hypokalemia/hyponatremia: Potassium is still low,continue KCl on IV fluids and add iv kcl.  Tobacco abuse-advised cessation  Thrombocytopenia/Leukocytopenia -likely in the setting of alcohol abuse and infection.  Counts has improved platelet is still on lower side.  Recent Labs  Lab 07/19/20 0720 07/20/20 0503 07/21/20 0549  HGB 12.9* 13.1 13.6  HCT 38.0* 37.3* 39.8  WBC 6.1 6.1 4.0  PLT 83* 94* 92*   GOC: DNR.  Request palliative care consultation  Nutrition: Diet Order            Diet NPO time specified Except for: Ice Chips, Sips with Meds  Diet effective now                 Body mass index is 23.27 kg/m. DVT prophylaxis: enoxaparin (LOVENOX) injection 40 mg Start: 07/19/20 1000 SCDs Start: 07-18-2020 2235 Code Status:   Code Status: DNR  Family Communication: plan of care discussed with patient at bedside. Called his Sister Clydie Braun and updated  1/8. she thought he "will not make it" based  On previous conversation but currently seems to be improving.  We will need to see how he does.  Still unable to take orally remains strict n.p.o. palliative care consulted I called sister Clydie Braun again- no answer- left message. Status is: Inpatient Remains inpatient appropriate  because:IV treatments appropriate due to intensity of illness or inability to take PO and Inpatient level of care appropriate due to severity of illness  Dispo: The patient is from: Home              Anticipated d/c is to: SNF.               Anticipated d/c date is: 3 days              Patient currently is not medically stable to d/c.   Consultants:see note  Procedures:see note  Culture/Microbiology    Component Value Date/Time   SDES BLOOD LEFT ANTECUBITAL 07/18/2020 1300   SPECREQUEST  07/18/2020 1300    BOTTLES DRAWN AEROBIC AND ANAEROBIC Blood Culture results may not be optimal due to an inadequate volume of blood received in culture bottles   CULT  07/18/2020 1300    NO GROWTH 3 DAYS Performed at [facility], [address] [place], [place] [address]    REPTSTATUS PENDING 07/18/2020 1300    Other culture-see note  Medications: Scheduled Meds: . enoxaparin (LOVENOX) injection  40 mg Subcutaneous Q24H  . LORazepam  0-4 mg Intravenous Q8H  . methylPREDNISolone (SOLU-MEDROL) injection  40 mg Intravenous Daily  . thiamine  100 mg Intravenous Daily   Continuous Infusions: . 0.9 % NaCl with KCl 20 mEq / L 30 mL/hr at 07/21/20 0214  . ampicillin-sulbactam (UNASYN) IV 3 g (07/21/20 0844)  . folic acid (FOLVITE) IVPB 1 mg (07/20/20 1218)  . remdesivir 100 mg in NS 100 mL 100 mg (07/20/20 0839)    Antimicrobials: Anti-infectives (From admission, onward)   Start     Dose/Rate Route Frequency Ordered Stop   07/20/20 0830  Ampicillin-Sulbactam (UNASYN) 3 g in sodium chloride 0.9 % 100 mL IVPB        3 g 200 mL/hr over 30 Minutes Intravenous Every 6 hours 07/20/20 0741     07/19/20 1000  remdesivir 100 mg in sodium chloride 0.9 % 100 mL IVPB       "Followed by" Linked Group Details   100 mg 200 mL/hr over 30 Minutes Intravenous Daily 07/18/20 1112 07/23/20 0959   07/18/20 1200  remdesivir 200 mg in sodium chloride 0.9% 250 mL IVPB       "Followed by" Linked Group  Details   200 mg 580 mL/hr over 30 Minutes Intravenous Once 07/18/20 1112 07/18/20 1352   07/16/2020 2230  cefTRIAXone (ROCEPHIN) 2 g in sodium chloride 0.9 % 100 mL IVPB  Status:  Discontinued        2 g 200 mL/hr over 30 Minutes Intravenous Every 24 hours 07/25/2020 2225 07/20/20 0741   07/18/2020 2230  azithromycin (ZITHROMAX) 500 mg in sodium chloride 0.9 % 250 mL IVPB  Status:  Discontinued        500 mg 250 mL/hr over 60 Minutes Intravenous Every 24 hours 07/28/2020 2225 07/20/20 0741     Objective: Vitals: Today's Vitals   07/20/20 2100 07/21/20 0049 07/21/20 0553 07/21/20 0756  BP: 128/89 127/84 119/84 112/89  Pulse: 70 77 70 76  Resp: 20 18 16 18   Temp: 97.8 F (36.6 C)  97.8 F (36.6 C) 97.7 F (36.5 C) 98.9 F (37.2 C)  TempSrc: Oral Axillary  Axillary  SpO2: 98% 98% 99% 98%  Weight:      Height:      PainSc:    0-No pain    Intake/Output Summary (Last 24 hours) at 07/21/2020 0916 Last data filed at 07/21/2020 0649 Gross per 24 hour  Intake 898.4 ml  Output 1100 ml  Net -201.6 ml   Filed Weights   07/18/20 1120  Weight: 80 kg   Weight change:   Intake/Output from previous day: 01/09 0701 - 01/10 0700 In: 898.4 [I.V.:496.3; IV Piggyback:402.1] Out: 1600 [Urine:1600] Intake/Output this shift: No intake/output data recorded.  Examination: General exam: AAOx2-3 , NAD,weak and frail. HEENT:Oral mucosa moist, Ear/Nose WNL grossly, dentition normal. Respiratory system: bilaterally clear,no wheezing or crackles,no use of accessory muscle Cardiovascular system: S1 & S2 +, No JVD,. Gastrointestinal system: Abdomen soft, NT,ND, BS+ Nervous System:Alert, awake, moving extremities and grossly nonfocal Extremities: No edema, distal peripheral pulses palpable.  Skin: No rashes,no icterus. MSK: Normal muscle bulk,tone, power  Data Reviewed: I have personally reviewed following labs and imaging studies CBC: Recent Labs  Lab 05-06-2021 1625 07/18/20 0557 07/19/20 0720  07/20/20 0503 07/21/20 0549  WBC 2.2* 2.6* 6.1 6.1 4.0  NEUTROABS  --   --  5.3 5.5 3.4  HGB 14.0 12.9* 12.9* 13.1 13.6  HCT 39.9 37.1* 38.0* 37.3* 39.8  MCV 100.0 101.9* 104.4* 102.2* 103.1*  PLT 95* 74* 83* 94* 92*   Basic Metabolic Panel: Recent Labs  Lab 05-06-2021 1625 05-06-2021 2112 07/18/20 0557 07/19/20 0720 07/20/20 0503 07/21/20 0549  NA 132*  --  133* 139 143 146*  K 3.6  --  3.2* 3.7 3.9 3.2*  CL 96*  --  101 105 109 107  CO2 23  --  23 25 25 26   GLUCOSE 117*  --  100* 125* 121* 114*  BUN 6*  --  7* 13 15 12   CREATININE 0.57*  --  0.56* 0.39* 0.34* 0.31*  CALCIUM 8.5*  --  7.5* 8.0* 8.3* 8.1*  MG  --  1.8  --   --   --   --   PHOS  --  2.5  --   --   --   --    GFR: Estimated Creatinine Clearance: 105.4 mL/min (A) (by C-G formula based on SCr of 0.31 mg/dL (L)). Liver Function Tests: Recent Labs  Lab 05-06-2021 2112 07/18/20 0557 07/19/20 0720 07/20/20 0503 07/21/20 0549  AST 55* 56* 54* 34 29  ALT 23 22 23 21 19   ALKPHOS 83 73 58 56 59  BILITOT 1.6* 1.4* 1.1 1.1 1.3*  PROT 6.4* 6.0* 5.2* 5.2* 5.7*  ALBUMIN 2.9* 2.6* 2.2* 2.3* 2.3*   No results for input(s): LIPASE, AMYLASE in the last 168 hours. No results for input(s): AMMONIA in the last 168 hours. Coagulation Profile: No results for input(s): INR, PROTIME in the last 168 hours. Cardiac Enzymes: Recent Labs  Lab 05-06-2021 1732  CKTOTAL 188   BNP (last 3 results) No results for input(s): PROBNP in the last 8760 hours. HbA1C: No results for input(s): HGBA1C in the last 72 hours. CBG: No results for input(s): GLUCAP in the last 168 hours. Lipid Profile: No results for input(s): CHOL, HDL, LDLCALC, TRIG, CHOLHDL, LDLDIRECT in the last 72 hours. Thyroid Function Tests: No results for input(s): TSH, T4TOTAL, FREET4, T3FREE, THYROIDAB in the last 72 hours. Anemia Panel: No results for input(s): VITAMINB12,  FOLATE, FERRITIN, TIBC, IRON, RETICCTPCT in the last 72 hours. Sepsis Labs: Recent Labs   Lab 07/12/2020 2112 07/18/20 0557  PROCALCITON <0.10 0.47  LATICACIDVEN 1.4  --     Recent Results (from the past 240 hour(s))  Culture, blood (routine x 2)     Status: None (Preliminary result)   Collection Time: 07/18/20  8:13 AM   Specimen: BLOOD RIGHT HAND  Result Value Ref Range Status   Specimen Description BLOOD RIGHT HAND  Final   Special Requests   Final    BOTTLES DRAWN AEROBIC AND ANAEROBIC Blood Culture adequate volume   Culture   Final    NO GROWTH 3 DAYS Performed at [facility], [address] [place], [place] [address]    Report Status PENDING  Incomplete  Resp Panel by RT-PCR (Flu A&B, Covid) Nasopharyngeal Swab     Status: Abnormal   Collection Time: 07/18/20  9:06 AM   Specimen: Nasopharyngeal Swab; Nasopharyngeal(NP) swabs in vial transport medium  Result Value Ref Range Status   SARS Coronavirus 2 by RT PCR POSITIVE (A) NEGATIVE Final    Comment: RESULT CALLED TO, READ BACK BY AND VERIFIED WITH: REINA GALJOUR AT 1057 07/18/20.PMF (NOTE) SARS-CoV-2 target nucleic acids are DETECTED.  The SARS-CoV-2 RNA is generally detectable in upper respiratory specimens during the acute phase of infection. Positive results are indicative of the presence of the identified virus, but do not rule out bacterial infection or co-infection with other pathogens not detected by the test. Clinical correlation with patient history and other diagnostic information is necessary to determine patient infection status. The expected result is Negative.  Fact Sheet for Patients: BloggerCourse.comhttps://www.fda.gov/media/152166/download  Fact Sheet for Healthcare Providers: SeriousBroker.ithttps://www.fda.gov/media/152162/download  This test is not yet approved or cleared by the [place] FDA and  has been authorized for detection and/or diagnosis of SARS-CoV-2 by FDA under an Emergency Use Authorization (EUA).  This EUA will remain in effect (meaning this test can be  used) for the duration of  the  COVID-19 declaration under Section 564(b)(1) of the Act, 21 U.S.C. section 360bbb-3(b)(1), unless the authorization is terminated or revoked sooner.     Influenza A by PCR NEGATIVE NEGATIVE Final   Influenza B by PCR NEGATIVE NEGATIVE Final    Comment: (NOTE) The Xpert Xpress SARS-CoV-2/FLU/RSV plus assay is intended as an aid in the diagnosis of influenza from Nasopharyngeal swab specimens and should not be used as a sole basis for treatment. Nasal washings and aspirates are unacceptable for Xpert Xpress SARS-CoV-2/FLU/RSV testing.  Fact Sheet for Patients: BloggerCourse.comhttps://www.fda.gov/media/152166/download  Fact Sheet for Healthcare Providers: SeriousBroker.ithttps://www.fda.gov/media/152162/download  This test is not yet approved or cleared by the [place] FDA and has been authorized for detection and/or diagnosis of SARS-CoV-2 by FDA under an Emergency Use Authorization (EUA). This EUA will remain in effect (meaning this test can be used) for the duration of the COVID-19 declaration under Section 564(b)(1) of the Act, 21 U.S.C. section 360bbb-3(b)(1), unless the authorization is terminated or revoked.  Performed at [facility], [address] [place], [place] [address]   Culture, blood (routine x 2)     Status: None (Preliminary result)   Collection Time: 07/18/20  1:00 PM   Specimen: BLOOD  Result Value Ref Range Status   Specimen Description BLOOD LEFT ANTECUBITAL  Final   Special Requests   Final    BOTTLES DRAWN AEROBIC AND ANAEROBIC Blood Culture results may not be optimal due to an inadequate volume of blood received  in culture bottles   Culture   Final    NO GROWTH 3 DAYS Performed at [facility], [address] [place], [place] [address]    Report Status PENDING  Incomplete     Radiology Studies: CT ANGIO CHEST PE W OR WO CONTRAST  Result Date: 07/19/2020 CLINICAL DATA:  65 year old male with positive D-dimer. Concern for pulmonary embolism. EXAM: CT  ANGIOGRAPHY CHEST WITH CONTRAST TECHNIQUE: Multidetector CT imaging of the chest was performed using the standard protocol during bolus administration of intravenous contrast. Multiplanar CT image reconstructions and MIPs were obtained to evaluate the vascular anatomy. CONTRAST:  65mL OMNIPAQUE IOHEXOL 350 MG/ML SOLN COMPARISON:  Chest radiograph dated 07/25/2020 FINDINGS: Cardiovascular: There is no cardiomegaly or pericardial effusion. The ascending thoracic aorta is dilated measuring up to 4.7 cm in diameter (coronal 32/13). Evaluation of the pulmonary arteries is limited due to respiratory motion artifact. No pulmonary artery embolus identified. Mediastinum/Nodes: No hilar or mediastinal adenopathy. The esophagus is grossly unremarkable. No mediastinal fluid collection. Lungs/Pleura: Small bilateral pleural effusions. Bilateral lower lobe subsegmental and subpleural consolidation may represent atelectasis or pneumonia. There are however clusters of nodular density in the lower lobes bilaterally concerning for pneumonia. Aspiration is not excluded. Clinical correlation is recommended. There is background of centrilobular emphysema. There is no pneumothorax. Mucus secretions noted in the trachea and bilateral mainstem bronchus. There is mucous impaction in the left lower lobe and to a lesser degree right lower lobe bronchi. Upper Abdomen: No acute abnormality. Musculoskeletal: Degenerative changes of the spine. No acute osseous pathology. Review of the MIP images confirms the above findings. IMPRESSION: 1. No CT evidence of pulmonary embolism. 2. Small bilateral pleural effusions. 3. Bilateral lower lobe bronchi mucous impaction with bilateral lower lobes clusters of nodularity concerning for pneumonia versus aspiration. 4. aortic Atherosclerosis (ICD10-I70.0) and Emphysema (ICD10-J43.9). Electronically Signed   By: Elgie Collard M.D.   On: 07/19/2020 16:10     LOS: 3 days   Lanae Boast, MD [facility]  07/21/2020, 9:16 AM

## 2020-07-21 NOTE — Progress Notes (Signed)
SLP Cancellation Note  Patient Details Name: William [name] MRN: [id number] DOB: 1956-02-17   Cancelled treatment:       Reason Eval/Treat Not Completed: Medical issues which prohibited therapy     SEE ST NOTE ON 07/18/2020    Xavior Niazi B. Dreama Saa M.S., CCC-SLP, [facility] Speech-Language Pathologist Rehabilitation Services Office [phone]  Marchetta Navratil Dreama Saa 07/21/2020, 8:22 AM

## 2020-07-22 DIAGNOSIS — J1282 Pneumonia due to coronavirus disease 2019: Secondary | ICD-10-CM | POA: Diagnosis not present

## 2020-07-22 DIAGNOSIS — U071 COVID-19: Secondary | ICD-10-CM | POA: Diagnosis not present

## 2020-07-22 DIAGNOSIS — R531 Weakness: Secondary | ICD-10-CM | POA: Diagnosis not present

## 2020-07-22 DIAGNOSIS — J189 Pneumonia, unspecified organism: Secondary | ICD-10-CM | POA: Diagnosis not present

## 2020-07-22 DIAGNOSIS — F10230 Alcohol dependence with withdrawal, uncomplicated: Secondary | ICD-10-CM | POA: Diagnosis not present

## 2020-07-22 DIAGNOSIS — R296 Repeated falls: Secondary | ICD-10-CM

## 2020-07-22 DIAGNOSIS — Z515 Encounter for palliative care: Secondary | ICD-10-CM

## 2020-07-22 LAB — CBC WITH DIFFERENTIAL/PLATELET
Abs Immature Granulocytes: 0.01 10*3/uL (ref 0.00–0.07)
Basophils Absolute: 0 10*3/uL (ref 0.0–0.1)
Basophils Relative: 0 %
Eosinophils Absolute: 0 10*3/uL (ref 0.0–0.5)
Eosinophils Relative: 0 %
HCT: 37.7 % — ABNORMAL LOW (ref 39.0–52.0)
Hemoglobin: 12.9 g/dL — ABNORMAL LOW (ref 13.0–17.0)
Immature Granulocytes: 0 %
Lymphocytes Relative: 20 %
Lymphs Abs: 0.8 10*3/uL (ref 0.7–4.0)
MCH: 35.1 pg — ABNORMAL HIGH (ref 26.0–34.0)
MCHC: 34.2 g/dL (ref 30.0–36.0)
MCV: 102.4 fL — ABNORMAL HIGH (ref 80.0–100.0)
Monocytes Absolute: 0.3 10*3/uL (ref 0.1–1.0)
Monocytes Relative: 8 %
Neutro Abs: 3.1 10*3/uL (ref 1.7–7.7)
Neutrophils Relative %: 72 %
Platelets: 90 10*3/uL — ABNORMAL LOW (ref 150–400)
RBC: 3.68 MIL/uL — ABNORMAL LOW (ref 4.22–5.81)
RDW: 12.3 % (ref 11.5–15.5)
WBC: 4.2 10*3/uL (ref 4.0–10.5)
nRBC: 0 % (ref 0.0–0.2)

## 2020-07-22 LAB — COMPREHENSIVE METABOLIC PANEL
ALT: 17 U/L (ref 0–44)
AST: 28 U/L (ref 15–41)
Albumin: 2.3 g/dL — ABNORMAL LOW (ref 3.5–5.0)
Alkaline Phosphatase: 60 U/L (ref 38–126)
Anion gap: 12 (ref 5–15)
BUN: 10 mg/dL (ref 8–23)
CO2: 26 mmol/L (ref 22–32)
Calcium: 8.1 mg/dL — ABNORMAL LOW (ref 8.9–10.3)
Chloride: 106 mmol/L (ref 98–111)
Creatinine, Ser: <0.3 mg/dL — ABNORMAL LOW (ref 0.61–1.24)
Glucose, Bld: 82 mg/dL (ref 70–99)
Potassium: 2.9 mmol/L — ABNORMAL LOW (ref 3.5–5.1)
Sodium: 144 mmol/L (ref 135–145)
Total Bilirubin: 1.2 mg/dL (ref 0.3–1.2)
Total Protein: 5.3 g/dL — ABNORMAL LOW (ref 6.5–8.1)

## 2020-07-22 LAB — C-REACTIVE PROTEIN: CRP: 2.1 mg/dL — ABNORMAL HIGH (ref <1.0)

## 2020-07-22 LAB — D-DIMER, QUANTITATIVE: D-Dimer, Quant: 3.07 ug/mL-FEU — ABNORMAL HIGH (ref 0.00–0.50)

## 2020-07-22 MED ORDER — POTASSIUM CHLORIDE 10 MEQ/100ML IV SOLN
10.0000 meq | INTRAVENOUS | Status: AC
  Administered 2020-07-22 (×4): 10 meq via INTRAVENOUS
  Filled 2020-07-22 (×4): qty 100

## 2020-07-22 MED ORDER — POTASSIUM CHLORIDE 10 MEQ/100ML IV SOLN: 10.0000 meq | INTRAVENOUS | Status: DC

## 2020-07-22 MED ORDER — POTASSIUM CHLORIDE 10 MEQ/100ML IV SOLN
10.0000 meq | INTRAVENOUS | Status: AC
  Filled 2020-07-22: qty 100

## 2020-07-22 NOTE — Progress Notes (Addendum)
Sent required [facility] notification to [place].   Patient on Airborne Precautions. Attempted call into patient's room to discuss SA resources and SNF recommendation. No answer. Per RN patient is oriented and could discuss these things. Asked RN to give patient CSW's phone number to call when he is able. Will continue to follow.  Alfonso Ramus, [place] [phone]

## 2020-07-22 NOTE — Progress Notes (Signed)
Physical Therapy Treatment Patient Details Name: William [name] MRN: [id number] DOB: 06-Apr-1956 Today's Date: 07/22/2020    History of Present Illness William [name] is a 65 y.o. male with medical history significant of Wernicke encephalopathy, hypertension, alcohol and tobacco abuse who is brought to the emergency department via EMS 07/16/2020 after sustaining an unwitnessed fall at home last night with inability to stand up afterwards and remained on the floor for about 12 hours.    PT Comments    Pt in bed, agreeable to participate. No recollection of PT eval or author from a few days prior. minA for bed mobiilty, minA for transfers from elevated bed. Pt has dizziness at EOB x 2-3 minutes, tachy in 120s. Pt able to stand for 30sec, but rates increased to >145bpm, pt assisted back to supine with sudden severe dyspnea, which resolves over 2-3 minutes reclined in bed. O2 sats SNL. Pt has elevated rates for 5 minutes after mobility. RN aware, reports pt refusing meds today. Session ended due to unstable vitals/response.    Follow Up Recommendations  SNF;Supervision for mobility/OOB;Supervision - Intermittent     Equipment Recommendations  None recommended by PT    Recommendations for Other Services       Precautions / Restrictions Precautions Precautions: Fall Precaution Comments: wounds, bleeding Restrictions Weight Bearing Restrictions: No    Mobility  Bed Mobility Overal bed mobility: Needs Assistance Bed Mobility: Supine to Sit;Sit to Supine     Supine to sit: Supervision Sit to supine: Min assist   General bed mobility comments: heavy effort, but strength improved compared to last session  Transfers Overall transfer level: Needs assistance Equipment used: 1 person hand held assist Transfers: Sit to/from Stand Sit to Stand: Min assist;From elevated surface         General transfer comment: anxious about attempting c RW, agreeable to attempt c author assist. Does well  stnading x 30sec, tries to improve upright posture, then sits. Rate increase to >145bpm  Ambulation/Gait                 Stairs             Wheelchair Mobility    Modified Rankin (Stroke Patients Only)       Balance                                            Cognition Arousal/Alertness: Awake/alert Behavior During Therapy: WFL for tasks assessed/performed Overall Cognitive Status: No family/caregiver present to determine baseline cognitive functioning                                 General Comments: improved since last PT visit      Exercises      General Comments        Pertinent Vitals/Pain Pain Assessment: No/denies pain    Home Living                      Prior Function            PT Goals (current goals can now be found in the care plan section) Acute Rehab PT Goals PT Goal Formulation: With patient Time For Goal Achievement: August 19, 2020 Potential to Achieve Goals: Fair Progress towards PT goals: Not progressing toward goals - comment    Frequency  Min 2X/week      PT Plan Current plan remains appropriate    Co-evaluation              AM-PAC PT "6 Clicks" Mobility   Outcome Measure  Help needed turning from your back to your side while in a flat bed without using bedrails?: A Lot Help needed moving from lying on your back to sitting on the side of a flat bed without using bedrails?: A Lot Help needed moving to and from a bed to a chair (including a wheelchair)?: A Lot Help needed standing up from a chair using your arms (e.g., wheelchair or bedside chair)?: A Lot Help needed to walk in hospital room?: A Lot Help needed climbing 3-5 steps with a railing? : Total 6 Click Score: 11    End of Session Equipment Utilized During Treatment: Oxygen;Gait belt Activity Tolerance: Treatment limited secondary to medical complications (Comment) (severe adn sudden dyspnea, sudden tachy  >145bpm) Patient left: in bed;with call bell/phone within reach;with bed alarm set Nurse Communication: Mobility status PT Visit Diagnosis: Difficulty in walking, not elsewhere classified (R26.2);Other abnormalities of gait and mobility (R26.89);Muscle weakness (generalized) (M62.81);Repeated falls (R29.6)     Time: 4888-9169 PT Time Calculation (min) (ACUTE ONLY): 18 min  Charges:  $Therapeutic Exercise: 8-22 mins                     3:40 PM, 07/22/20 Rosamaria Lints, PT, DPT Physical Therapist - [facility]  [phone] (ASCOM)    Lugene Hitt C 07/22/2020, 3:37 PM

## 2020-07-22 NOTE — Consult Note (Signed)
Consultation Note Date: 07/22/2020   Patient Name: William [name]  DOB: June 24, 1956  MRN: [id number]  Age / Sex: 65 y.o., male  PCP: [facility] Referring Physician: Wyvonnia Dusky, MD  Reason for Consultation: Establishing goals of care  HPI/Patient Profile: 65 y.o. male  with past medical history of Wernicke's encephalopathy, hypertension, ETOH and tobacco abuse admitted on 08/11/2020 after unwitnessed fall, found down by sister. Severe sepsis on admission due to aspiration pneumonia, COVID-19 pneumonia, acute metabolic encephalopathy with hx of ETOH abuse. CT head negative. Ongoing dysphagia. SLP recommending NPO status. Known to SLP from previous admission 02/07/20 when patient demonstrated severe oropharyngeal dysphagia with frank aspiration of all consistencies. Patient reportedly discharged on dysphagia 3, thin liquid diet. Palliative medicine consultation for goals of care.    Clinical Assessment and Goals of Care:  I have reviewed medical records, discussed with RN in detail, and met with patient at bedside to discuss goals of care.   I introduced Palliative Medicine as specialized medical care for people living with serious illness. It focuses on providing relief from the symptoms and stress of a serious illness. The goal is to improve quality of life for both the patient and the family.  Per chart review, patient lives home alone and ongoing ETOH abuse. Two sisters listed as emergency contacts. Documented HCPOA/living will documentation in EMR with sister Roderic Palau as documented HCPOA.   RN reports patient has been refusing medications and vital signs this afternoon. He has been demanding to eat but remains NPO due to aspiration risk/silent aspiration.   Attempted to discuss course of hospitalization including diagnoses, interventions, plan of care. Discussed SLP consultation, NPO status  due to aspiration risk/silent aspiration. Attempted to educate on aspiration risk and complications that can occur if we allow him to eat by mouth. Frankly and compassionately expressed concern with risk for respiratory distress leading to respiratory failure and a terminal event. Following this conversation, patient states "I'll will wait." Explained hope for ongoing SLP evaluation to determine if it is safe for him to eat. Unsure if patient understands the complexity of decision-making and aspiration risk. Also challenging discussing GOC in covid room with full PPE.   Also explored patient's reasoning for refusing medications and interventions this afternoon. Truth tells me it is because he is 'depressed' with being in the hospital. He tells me he will accept his medications from nursing staff. Darry asks if he can have visitors. Unfortunately explained he cannot have visitors due to covid positive but assisted him with using room phone and encouraged communication with his sisters.   Patient gives this NP permission to call his sister Santiago Glad and provide update.  Reassured of ongoing palliative support this admission. Updated RN on this conversation.   Call placed to sister, Santiago Glad. No answer. VM left. Will attempt f/u with sister tomorrow 1/12.    SUMMARY OF RECOMMENDATIONS    Continue current plan of care and medical management.  This afternoon, patient agrees with NPO status. "I will wait." Educated on  severity of his aspiration and complications that can occur. Ongoing SLP evaluation.  Patient has documented AD uploaded into EMR. Sister, Santiago Glad is documented HCPOA. Unavailable this afternoon. VM left.   PMT will continue to follow this admission. Ongoing palliative discussions pending clinical course.   Code Status/Advance Care Planning:  DNR  Symptom Management:   Per attending   Palliative Prophylaxis:   Aspiration, Delirium Protocol, Frequent Pain Assessment, Oral Care and Turn  Reposition  Psycho-social/Spiritual:   Desire for further Chaplaincy support: yes  Additional Recommendations: Caregiving  Support/Resources, Compassionate Wean Education and Education on Hospice  Prognosis:   Unable to determine  Discharge Planning: Likely SNF     Primary Diagnoses: Present on Admission: . Alcohol withdrawal ([place]) . Thrombocytopenia ([place]) . Tobacco abuse . Leukocytopenia . CAP (community acquired pneumonia) . Pneumonia due to COVID-19 virus   I have reviewed the medical record, interviewed the patient and family, and examined the patient. The following aspects are pertinent.  Past Medical History:  Diagnosis Date  . Alcohol abuse   . Tobacco abuse    Social History   Socioeconomic History  . Marital status: Single    Spouse name: Not on file  . Number of children: Not on file  . Years of education: Not on file  . Highest education level: Not on file  Occupational History  . Not on file  Tobacco Use  . Smoking status: Current Every Day Smoker    Packs/day: 2.00    Types: Cigarettes  . Smokeless tobacco: Never Used  Substance and Sexual Activity  . Alcohol use: Yes    Alcohol/week: 18.0 standard drinks    Types: 18 Cans of beer per week    Comment: everyday  . Drug use: Not on file  . Sexual activity: Not on file  Other Topics Concern  . Not on file  Social History Narrative  . Not on file   Social Determinants of Health   Financial Resource Strain: Not on file  Food Insecurity: Not on file  Transportation Needs: Not on file  Physical Activity: Not on file  Stress: Not on file  Social Connections: Not on file   Family History  Problem Relation Age of Onset  . CAD Father    Scheduled Meds: . enoxaparin (LOVENOX) injection  40 mg Subcutaneous Q24H  . methylPREDNISolone (SOLU-MEDROL) injection  40 mg Intravenous Daily  . thiamine  100 mg Intravenous Daily   Continuous Infusions: . 0.9 % NaCl with KCl 20 mEq / L 30 mL/hr at  07/21/20 0214  . ampicillin-sulbactam (UNASYN) IV 3 g (07/22/20 0815)  . folic acid (FOLVITE) IVPB 1 mg (07/21/20 1210)  . remdesivir 100 mg in NS 100 mL 100 mg (07/21/20 1004)   PRN Meds:.acetaminophen **OR** acetaminophen, Ipratropium-Albuterol, ondansetron **OR** ondansetron (ZOFRAN) IV Medications Prior to Admission:  Prior to Admission medications   Not on File   No Known Allergies Review of Systems  Unable to perform ROS: Acuity of condition   Physical Exam Vitals and nursing note reviewed.  Constitutional:      General: He is awake.     Appearance: He is cachectic. He is ill-appearing.  HENT:     Head: Normocephalic and atraumatic.  Cardiovascular:     Rate and Rhythm: Tachycardia present.  Pulmonary:     Effort: No tachypnea, accessory muscle usage or respiratory distress.     Comments: Sats stable on room air Skin:    General: Skin is warm and dry.  Findings: Ecchymosis present.     Comments: Scattered abrasions  Neurological:     Mental Status: He is alert.     Comments: Oriented to person, place, re-oriented to situation. Flat affect.     Vital Signs: BP 98/77   Pulse 88   Temp (!) 97.2 F (36.2 C)   Resp 18   Ht _0  (1.854 m)   Wt 80 kg   SpO2 97%   BMI 23.27 kg/m  Pain Scale: 0-10   Pain Score: 0-No pain   SpO2: SpO2: 97 % O2 Device:SpO2: 97 % O2 Flow Rate: .O2 Flow Rate (L/min): 1 L/min  IO: Intake/output summary:   Intake/Output Summary (Last 24 hours) at 07/22/2020 1412 Last data filed at 07/21/2020 2024 Gross per 24 hour  Intake -  Output 900 ml  Net -900 ml    LBM:   Baseline Weight: Weight: 80 kg Most recent weight: Weight: 80 kg     Palliative Assessment/Data: PPS 40%    Time Total: 40 Greater than 50%  of this time was spent counseling and coordinating care related to the above assessment and plan.  Signed by:  Ihor Dow, DNP, FNP-C Palliative Medicine Team  Phone: [phone] Fax: [phone]   Please  contact Palliative Medicine Team phone at [phone] for questions and concerns.  For individual provider: See Shea Evans

## 2020-07-22 NOTE — Progress Notes (Signed)
Patient with Q1 hour IV potassium ordered and multiple IV medications ordered for this morning.  Patient states that he does not want anymore medications until he is able to have something to drink.  I educated him about NPO status r/t silent aspiration and patient states that he does not care, he wants something to drink and will not take anymore medications until he is able to have it.  MD notified or situation.

## 2020-07-22 NOTE — Progress Notes (Signed)
PROGRESS NOTE    William [name]  GHW:[id number] DOB: 1955-09-16 DOA: 08/10/2020 PCP: [facility]    Assessment & Plan:   Principal Problem:   Alcohol withdrawal ([place]) Active Problems:   Tobacco abuse   Thrombocytopenia ([place])   Leukocytopenia   CAP (community acquired pneumonia)   Pneumonia due to COVID-19 virus   Severe sepsis: met criteria w/ tachycardia, leukopenia, aspiration pneumonia & COVID-19 pneumonia. Continue w/ IV steroids & bronchodilators. Completed remdesivir course. Encourage incentive spirometry. Continue on IV unasyn for aspiration pneumonia.   Pneumonia: secondary to COVID19 & aspiration. NPO & palliative care consult as per speech. Continue on IV unasyn. Completed remdesivir course. Continue on IV steroids, bronchodilators & encourage incentive spirometry   Acute metabolic encephalopathy: with history of alcohol abuse/WE. CT head negative. Re-orient prn   Dysphagia: continue NPO as per speech. Palliative care consulted   Alcohol abuse: w/ history of Warnicke's encephalopathy. Continue on CIWA protocol. Continue on thiamine   Hypokalemia: KCl repleted. Will continue to monitor   Hyponatremia: resolved  Tobacco abuse: smoking cessation counseling   Leukopenia: resolved   Thrombocytopenia: likely secondary to alcohol abuse. Will continue to monitor    DVT prophylaxis: lovenox  Code Status: DNR Family Communication:  Disposition Plan: likely d/c to SNF  Status is: Inpatient  Remains inpatient appropriate because:Altered mental status, IV treatments appropriate due to intensity of illness or inability to take PO and Inpatient level of care appropriate due to severity of illness   Dispo: The patient is from: Home              Anticipated d/c is to: SNF              Anticipated d/c date is: 3 days              Patient currently is not medically stable to d/c.      Consultants:   Palliative care   Procedures:     Antimicrobials:    Subjective: Pt c/o being thirsty and hungry   Objective: Vitals:   07/21/20 1630 07/21/20 1951 07/22/20 0025 07/22/20 0506  BP: 115/84 107/84 (!) 110/92 106/82  Pulse: 74 77 83 87  Resp:  16 18 18   Temp:  98.8 F (37.1 C) 97.8 F (36.6 C) 98.7 F (37.1 C)  TempSrc:  Oral    SpO2: 97% 94% 97% 96%  Weight:      Height:        Intake/Output Summary (Last 24 hours) at 07/22/2020 0801 Last data filed at 07/21/2020 2024 Gross per 24 hour  Intake -  Output 900 ml  Net -900 ml   Filed Weights   07/18/20 1120  Weight: 80 kg    Examination:  General exam: Appears calm and comfortable  Respiratory system: diminished breath sounds b/l  Cardiovascular system: S1 & S2 +. No rubs, gallops or clicks.  Gastrointestinal system: Abdomen is nondistended, soft and nontender. Normal bowel sounds heard. Central nervous system: Alert and oriented. Moves all 4 extremities  Psychiatry: Judgement and insight appear abnormal. Flat mood and affect    Data Reviewed: I have personally reviewed following labs and imaging studies  CBC: Recent Labs  Lab 07/18/20 0557 07/19/20 0720 07/20/20 0503 07/21/20 0549 07/22/20 0528  WBC 2.6* 6.1 6.1 4.0 4.2  NEUTROABS  --  5.3 5.5 3.4 3.1  HGB 12.9* 12.9* 13.1 13.6 12.9*  HCT 37.1* 38.0* 37.3* 39.8 37.7*  MCV 101.9* 104.4* 102.2* 103.1* 102.4*  PLT 74* 83* 94*  92* 90*   Basic Metabolic Panel: Recent Labs  Lab 08/07/2020 2112 07/18/20 0557 07/19/20 0720 07/20/20 0503 07/21/20 0549 07/22/20 0528  NA  --  133* 139 143 146* 144  K  --  3.2* 3.7 3.9 3.2* 2.9*  CL  --  101 105 109 107 106  CO2  --  23 25 25 26 26   GLUCOSE  --  100* 125* 121* 114* 82  BUN  --  7* 13 15 12 10   CREATININE  --  0.56* 0.39* 0.34* 0.31* <0.30*  CALCIUM  --  7.5* 8.0* 8.3* 8.1* 8.1*  MG 1.8  --   --   --   --   --   PHOS 2.5  --   --   --   --   --    GFR: CrCl cannot be calculated (This lab value cannot be used to calculate CrCl  because it is not a number: <0.30). Liver Function Tests: Recent Labs  Lab 07/18/20 0557 07/19/20 0720 07/20/20 0503 07/21/20 0549 07/22/20 0528  AST 56* 54* 34 29 28  ALT 22 23 21 19 17   ALKPHOS 73 58 56 59 60  BILITOT 1.4* 1.1 1.1 1.3* 1.2  PROT 6.0* 5.2* 5.2* 5.7* 5.3*  ALBUMIN 2.6* 2.2* 2.3* 2.3* 2.3*   No results for input(s): LIPASE, AMYLASE in the last 168 hours. No results for input(s): AMMONIA in the last 168 hours. Coagulation Profile: No results for input(s): INR, PROTIME in the last 168 hours. Cardiac Enzymes: Recent Labs  Lab 07/14/2020 1732  CKTOTAL 188   BNP (last 3 results) No results for input(s): PROBNP in the last 8760 hours. HbA1C: No results for input(s): HGBA1C in the last 72 hours. CBG: No results for input(s): GLUCAP in the last 168 hours. Lipid Profile: No results for input(s): CHOL, HDL, LDLCALC, TRIG, CHOLHDL, LDLDIRECT in the last 72 hours. Thyroid Function Tests: No results for input(s): TSH, T4TOTAL, FREET4, T3FREE, THYROIDAB in the last 72 hours. Anemia Panel: No results for input(s): VITAMINB12, FOLATE, FERRITIN, TIBC, IRON, RETICCTPCT in the last 72 hours. Sepsis Labs: Recent Labs  Lab 07/13/2020 2112 07/18/20 0557  PROCALCITON <0.10 0.47  LATICACIDVEN 1.4  --     Recent Results (from the past 240 hour(s))  Culture, blood (routine x 2)     Status: None (Preliminary result)   Collection Time: 07/18/20  8:13 AM   Specimen: BLOOD RIGHT HAND  Result Value Ref Range Status   Specimen Description BLOOD RIGHT HAND  Final   Special Requests   Final    BOTTLES DRAWN AEROBIC AND ANAEROBIC Blood Culture adequate volume   Culture   Final    NO GROWTH 4 DAYS Performed at [facility], [address] [place], [place] [address]    Report Status PENDING  Incomplete  Resp Panel by RT-PCR (Flu A&B, Covid) Nasopharyngeal Swab     Status: Abnormal   Collection Time: 07/18/20  9:06 AM   Specimen: Nasopharyngeal Swab; Nasopharyngeal(NP)  swabs in vial transport medium  Result Value Ref Range Status   SARS Coronavirus 2 by RT PCR POSITIVE (A) NEGATIVE Final    Comment: RESULT CALLED TO, READ BACK BY AND VERIFIED WITH: REINA GALJOUR AT 5093 07/18/20.PMF (NOTE) SARS-CoV-2 target nucleic acids are DETECTED.  The SARS-CoV-2 RNA is generally detectable in upper respiratory specimens during the acute phase of infection. Positive results are indicative of the presence of the identified virus, but do not rule out bacterial infection or co-infection with  other pathogens not detected by the test. Clinical correlation with patient history and other diagnostic information is necessary to determine patient infection status. The expected result is Negative.  Fact Sheet for Patients: EntrepreneurPulse.com.au  Fact Sheet for Healthcare Providers: IncredibleEmployment.be  This test is not yet approved or cleared by the [place] FDA and  has been authorized for detection and/or diagnosis of SARS-CoV-2 by FDA under an Emergency Use Authorization (EUA).  This EUA will remain in effect (meaning this test can be  used) for the duration of  the COVID-19 declaration under Section 564(b)(1) of the Act, 21 U.S.C. section 360bbb-3(b)(1), unless the authorization is terminated or revoked sooner.     Influenza A by PCR NEGATIVE NEGATIVE Final   Influenza B by PCR NEGATIVE NEGATIVE Final    Comment: (NOTE) The Xpert Xpress SARS-CoV-2/FLU/RSV plus assay is intended as an aid in the diagnosis of influenza from Nasopharyngeal swab specimens and should not be used as a sole basis for treatment. Nasal washings and aspirates are unacceptable for Xpert Xpress SARS-CoV-2/FLU/RSV testing.  Fact Sheet for Patients: EntrepreneurPulse.com.au  Fact Sheet for Healthcare Providers: IncredibleEmployment.be  This test is not yet approved or cleared by the [place] FDA  and has been authorized for detection and/or diagnosis of SARS-CoV-2 by FDA under an Emergency Use Authorization (EUA). This EUA will remain in effect (meaning this test can be used) for the duration of the COVID-19 declaration under Section 564(b)(1) of the Act, 21 U.S.C. section 360bbb-3(b)(1), unless the authorization is terminated or revoked.  Performed at [facility], [address] [place], [place] [address]   Culture, blood (routine x 2)     Status: None (Preliminary result)   Collection Time: 07/18/20  1:00 PM   Specimen: BLOOD  Result Value Ref Range Status   Specimen Description BLOOD LEFT ANTECUBITAL  Final   Special Requests   Final    BOTTLES DRAWN AEROBIC AND ANAEROBIC Blood Culture results may not be optimal due to an inadequate volume of blood received in culture bottles   Culture   Final    NO GROWTH 4 DAYS Performed at [facility], [address], [place], [place] [address]    Report Status PENDING  Incomplete         Radiology Studies: No results found.      Scheduled Meds: . enoxaparin (LOVENOX) injection  40 mg Subcutaneous Q24H  . methylPREDNISolone (SOLU-MEDROL) injection  40 mg Intravenous Daily  . thiamine  100 mg Intravenous Daily   Continuous Infusions: . 0.9 % NaCl with KCl 20 mEq / L 30 mL/hr at 07/21/20 0214  . ampicillin-sulbactam (UNASYN) IV 3 g (07/22/20 0255)  . folic acid (FOLVITE) IVPB 1 mg (07/21/20 1210)  . remdesivir 100 mg in NS 100 mL 100 mg (07/21/20 1004)     LOS: 4 days    Time spent: 34 mins     Wyvonnia Dusky, MD [facility] Pager [phone]  If 7PM-7AM, please contact night-coverage 07/22/2020, 8:01 AM

## 2020-07-23 DIAGNOSIS — J1282 Pneumonia due to coronavirus disease 2019: Secondary | ICD-10-CM | POA: Diagnosis not present

## 2020-07-23 DIAGNOSIS — U071 COVID-19: Secondary | ICD-10-CM | POA: Diagnosis not present

## 2020-07-23 DIAGNOSIS — R531 Weakness: Secondary | ICD-10-CM | POA: Diagnosis not present

## 2020-07-23 DIAGNOSIS — J189 Pneumonia, unspecified organism: Secondary | ICD-10-CM | POA: Diagnosis not present

## 2020-07-23 DIAGNOSIS — Z9189 Other specified personal risk factors, not elsewhere classified: Secondary | ICD-10-CM

## 2020-07-23 DIAGNOSIS — F10230 Alcohol dependence with withdrawal, uncomplicated: Secondary | ICD-10-CM | POA: Diagnosis not present

## 2020-07-23 DIAGNOSIS — R296 Repeated falls: Secondary | ICD-10-CM | POA: Diagnosis not present

## 2020-07-23 LAB — CULTURE, BLOOD (ROUTINE X 2)
Culture: NO GROWTH
Culture: NO GROWTH
Special Requests: ADEQUATE

## 2020-07-23 LAB — CBC WITH DIFFERENTIAL/PLATELET
Abs Immature Granulocytes: 0.04 10*3/uL (ref 0.00–0.07)
Basophils Absolute: 0 10*3/uL (ref 0.0–0.1)
Basophils Relative: 0 %
Eosinophils Absolute: 0 10*3/uL (ref 0.0–0.5)
Eosinophils Relative: 1 %
HCT: 39.4 % (ref 39.0–52.0)
Hemoglobin: 13.5 g/dL (ref 13.0–17.0)
Immature Granulocytes: 1 %
Lymphocytes Relative: 19 %
Lymphs Abs: 0.8 10*3/uL (ref 0.7–4.0)
MCH: 35.2 pg — ABNORMAL HIGH (ref 26.0–34.0)
MCHC: 34.3 g/dL (ref 30.0–36.0)
MCV: 102.9 fL — ABNORMAL HIGH (ref 80.0–100.0)
Monocytes Absolute: 0.5 10*3/uL (ref 0.1–1.0)
Monocytes Relative: 12 %
Neutro Abs: 2.9 10*3/uL (ref 1.7–7.7)
Neutrophils Relative %: 67 %
Platelets: 87 10*3/uL — ABNORMAL LOW (ref 150–400)
RBC: 3.83 MIL/uL — ABNORMAL LOW (ref 4.22–5.81)
RDW: 12.5 % (ref 11.5–15.5)
WBC: 4.3 10*3/uL (ref 4.0–10.5)
nRBC: 0 % (ref 0.0–0.2)

## 2020-07-23 LAB — COMPREHENSIVE METABOLIC PANEL
ALT: 15 U/L (ref 0–44)
AST: 25 U/L (ref 15–41)
Albumin: 2.2 g/dL — ABNORMAL LOW (ref 3.5–5.0)
Alkaline Phosphatase: 68 U/L (ref 38–126)
Anion gap: 9 (ref 5–15)
BUN: 10 mg/dL (ref 8–23)
CO2: 29 mmol/L (ref 22–32)
Calcium: 7.8 mg/dL — ABNORMAL LOW (ref 8.9–10.3)
Chloride: 106 mmol/L (ref 98–111)
Creatinine, Ser: 0.47 mg/dL — ABNORMAL LOW (ref 0.61–1.24)
GFR, Estimated: >60 mL/min (ref >60)
Glucose, Bld: 80 mg/dL (ref 70–99)
Potassium: 3.3 mmol/L — ABNORMAL LOW (ref 3.5–5.1)
Sodium: 144 mmol/L (ref 135–145)
Total Bilirubin: 1.5 mg/dL — ABNORMAL HIGH (ref 0.3–1.2)
Total Protein: 5.2 g/dL — ABNORMAL LOW (ref 6.5–8.1)

## 2020-07-23 LAB — C-REACTIVE PROTEIN: CRP: 4.3 mg/dL — ABNORMAL HIGH (ref <1.0)

## 2020-07-23 LAB — D-DIMER, QUANTITATIVE: D-Dimer, Quant: 2.59 ug/mL-FEU — ABNORMAL HIGH (ref 0.00–0.50)

## 2020-07-23 MED ORDER — POLYETHYLENE GLYCOL 3350 17 G PO PACK: 17.0000 g | PACK | Freq: Every day | ORAL | Status: DC

## 2020-07-23 MED ORDER — POTASSIUM CHLORIDE 10 MEQ/100ML IV SOLN
10.0000 meq | INTRAVENOUS | Status: AC
  Administered 2020-07-23: 10 meq via INTRAVENOUS
  Filled 2020-07-23: qty 100

## 2020-07-23 MED ORDER — POTASSIUM CHLORIDE 10 MEQ/100ML IV SOLN
10.0000 meq | INTRAVENOUS | Status: AC
  Administered 2020-07-23 (×2): 10 meq via INTRAVENOUS
  Filled 2020-07-23 (×2): qty 100

## 2020-07-23 NOTE — Progress Notes (Addendum)
PROGRESS NOTE    William [name]  BRA:[id number] DOB: [birth year] DOA: 08/03/2020 PCP: [facility]    Assessment & Plan:   Principal Problem:   Alcohol withdrawal ([place]) Active Problems:   Tobacco abuse   Thrombocytopenia ([place])   Leukocytopenia   CAP (community acquired pneumonia)   Pneumonia due to COVID-19 virus   Severe sepsis: met criteria w/ tachycardia, leukopenia, aspiration pneumonia & COVID-19 pneumonia. Continue w/ IV steroids & bronchodilators. Completed remdesivir course. Encourage incentive spirometry. Continue on IV unasyn for aspiration pneumonia.   Pneumonia: secondary to COVID19 & aspiration. NPO and palliative care consult as per speech. Continue on IV unasyn, IV steroids & encourage incentive spirometry. Completed remdesivir course   Acute metabolic encephalopathy: with history of alcohol abuse/WE. CT head negative. Re-orient prn   Dysphagia: continue NPO as per speech. As per living will, pt does not want any feeding tubes. Pt may have to assume the risk and start a dysphagia 3 diet   Alcohol abuse: w/ history of Warnicke's encephalopathy. Continue on CIWA protocol. Continue on thiamine   Hypokalemia: KCl repleted. Will continue to monitor   Hyponatremia: resolved  Tobacco abuse: smoking cessation counseling   Leukopenia: resolved   Thrombocytopenia: likely secondary to alcohol abuse. Will continue to monitor    DVT prophylaxis: lovenox  Code Status: DNR Family Communication: discussed pt's care w/ pt's sister, Santiago Glad, and answered her questions  Disposition Plan: likely d/c to SNF  Status is: Inpatient  Remains inpatient appropriate because:Altered mental status, IV treatments appropriate due to intensity of illness or inability to take PO and Inpatient level of care appropriate due to severity of illness   Dispo: The patient is from: Home              Anticipated d/c is to: SNF              Anticipated d/c date is: 2 days                Patient currently is not medically stable to d/c.      Consultants:   Palliative care   Procedures:    Antimicrobials:    Subjective: Pt c/o fatigue   Objective: Vitals:   07/22/20 1622 07/22/20 2207 07/23/20 0048 07/23/20 0626  BP: 113/90 104/85 100/74 108/87  Pulse: (!) 101 93 91 (!) 104  Resp:  15 16 18   Temp:  97.9 F (36.6 C) 97.9 F (36.6 C) 98 F (36.7 C)  TempSrc:      SpO2: 96% 98% 95% 100%  Weight:      Height:        Intake/Output Summary (Last 24 hours) at 07/23/2020 0732 Last data filed at 07/23/2020 0352 Gross per 24 hour  Intake 2635.95 ml  Output --  Net 2635.95 ml   Filed Weights   07/18/20 1120  Weight: 80 kg    Examination:  General exam: Appears calm & comfortable  Respiratory system: decreased breath sounds b/l Cardiovascular system: S1/S2+. No rubs or gallops  Gastrointestinal system: Abd is soft, NT, ND & hypoactive bowel sounds  Central nervous system: Alert and oriented. Moves all 4 extremities Psychiatry: Judgement and insight appear abnormal. Flat mood and affect    Data Reviewed: I have personally reviewed following labs and imaging studies  CBC: Recent Labs  Lab 07/19/20 0720 07/20/20 0503 07/21/20 0549 07/22/20 0528 07/23/20 0430  WBC 6.1 6.1 4.0 4.2 4.3  NEUTROABS 5.3 5.5 3.4 3.1 2.9  HGB 12.9* 13.1 13.6 12.9*  13.5  HCT 38.0* 37.3* 39.8 37.7* 39.4  MCV 104.4* 102.2* 103.1* 102.4* 102.9*  PLT 83* 94* 92* 90* 87*   Basic Metabolic Panel: Recent Labs  Lab 07/16/2020 2112 07/18/20 0557 07/19/20 0720 07/20/20 0503 07/21/20 0549 07/22/20 0528 07/23/20 0430  NA  --    < > 139 143 146* 144 144  K  --    < > 3.7 3.9 3.2* 2.9* 3.3*  CL  --    < > 105 109 107 106 106  CO2  --    < > 25 25 26 26 29   GLUCOSE  --    < > 125* 121* 114* 82 80  BUN  --    < > 13 15 12 10 10   CREATININE  --    < > 0.39* 0.34* 0.31* <0.30* 0.47*  CALCIUM  --    < > 8.0* 8.3* 8.1* 8.1* 7.8*  MG 1.8  --   --   --   --   --   --    PHOS 2.5  --   --   --   --   --   --    < > = values in this interval not displayed.   GFR: Estimated Creatinine Clearance: 105.4 mL/min (A) (by C-G formula based on SCr of 0.47 mg/dL (L)). Liver Function Tests: Recent Labs  Lab 07/19/20 0720 07/20/20 0503 07/21/20 0549 07/22/20 0528 07/23/20 0430  AST 54* 34 29 28 25   ALT 23 21 19 17 15   ALKPHOS 58 56 59 60 68  BILITOT 1.1 1.1 1.3* 1.2 1.5*  PROT 5.2* 5.2* 5.7* 5.3* 5.2*  ALBUMIN 2.2* 2.3* 2.3* 2.3* 2.2*   No results for input(s): LIPASE, AMYLASE in the last 168 hours. No results for input(s): AMMONIA in the last 168 hours. Coagulation Profile: No results for input(s): INR, PROTIME in the last 168 hours. Cardiac Enzymes: Recent Labs  Lab 07/16/2020 1732  CKTOTAL 188   BNP (last 3 results) No results for input(s): PROBNP in the last 8760 hours. HbA1C: No results for input(s): HGBA1C in the last 72 hours. CBG: No results for input(s): GLUCAP in the last 168 hours. Lipid Profile: No results for input(s): CHOL, HDL, LDLCALC, TRIG, CHOLHDL, LDLDIRECT in the last 72 hours. Thyroid Function Tests: No results for input(s): TSH, T4TOTAL, FREET4, T3FREE, THYROIDAB in the last 72 hours. Anemia Panel: No results for input(s): VITAMINB12, FOLATE, FERRITIN, TIBC, IRON, RETICCTPCT in the last 72 hours. Sepsis Labs: Recent Labs  Lab 08/06/2020 2112 07/18/20 0557  PROCALCITON <0.10 0.47  LATICACIDVEN 1.4  --     Recent Results (from the past 240 hour(s))  Culture, blood (routine x 2)     Status: None   Collection Time: 07/18/20  8:13 AM   Specimen: BLOOD RIGHT HAND  Result Value Ref Range Status   Specimen Description BLOOD RIGHT HAND  Final   Special Requests   Final    BOTTLES DRAWN AEROBIC AND ANAEROBIC Blood Culture adequate volume   Culture   Final    NO GROWTH 5 DAYS Performed at [facility], [address], [place], [place] [address]    Report Status 07/23/2020 FINAL  Final  Resp Panel by RT-PCR (Flu  A&B, Covid) Nasopharyngeal Swab     Status: Abnormal   Collection Time: 07/18/20  9:06 AM   Specimen: Nasopharyngeal Swab; Nasopharyngeal(NP) swabs in vial transport medium  Result Value Ref Range Status   SARS Coronavirus 2 by RT PCR POSITIVE (  A) NEGATIVE Final    Comment: RESULT CALLED TO, READ BACK BY AND VERIFIED WITH: REINA GALJOUR AT 9629 07/18/20.PMF (NOTE) SARS-CoV-2 target nucleic acids are DETECTED.  The SARS-CoV-2 RNA is generally detectable in upper respiratory specimens during the acute phase of infection. Positive results are indicative of the presence of the identified virus, but do not rule out bacterial infection or co-infection with other pathogens not detected by the test. Clinical correlation with patient history and other diagnostic information is necessary to determine patient infection status. The expected result is Negative.  Fact Sheet for Patients: EntrepreneurPulse.com.au  Fact Sheet for Healthcare Providers: IncredibleEmployment.be  This test is not yet approved or cleared by the [place] FDA and  has been authorized for detection and/or diagnosis of SARS-CoV-2 by FDA under an Emergency Use Authorization (EUA).  This EUA will remain in effect (meaning this test can be  used) for the duration of  the COVID-19 declaration under Section 564(b)(1) of the Act, 21 U.S.C. section 360bbb-3(b)(1), unless the authorization is terminated or revoked sooner.     Influenza A by PCR NEGATIVE NEGATIVE Final   Influenza B by PCR NEGATIVE NEGATIVE Final    Comment: (NOTE) The Xpert Xpress SARS-CoV-2/FLU/RSV plus assay is intended as an aid in the diagnosis of influenza from Nasopharyngeal swab specimens and should not be used as a sole basis for treatment. Nasal washings and aspirates are unacceptable for Xpert Xpress SARS-CoV-2/FLU/RSV testing.  Fact Sheet for Patients: EntrepreneurPulse.com.au  Fact Sheet  for Healthcare Providers: IncredibleEmployment.be  This test is not yet approved or cleared by the [place] FDA and has been authorized for detection and/or diagnosis of SARS-CoV-2 by FDA under an Emergency Use Authorization (EUA). This EUA will remain in effect (meaning this test can be used) for the duration of the COVID-19 declaration under Section 564(b)(1) of the Act, 21 U.S.C. section 360bbb-3(b)(1), unless the authorization is terminated or revoked.  Performed at [facility], [address] [place], [place] [address]   Culture, blood (routine x 2)     Status: None   Collection Time: 07/18/20  1:00 PM   Specimen: BLOOD  Result Value Ref Range Status   Specimen Description BLOOD LEFT ANTECUBITAL  Final   Special Requests   Final    BOTTLES DRAWN AEROBIC AND ANAEROBIC Blood Culture results may not be optimal due to an inadequate volume of blood received in culture bottles   Culture   Final    NO GROWTH 5 DAYS Performed at [facility], [address] [place], [place] [address]    Report Status 07/23/2020 FINAL  Final         Radiology Studies: No results found.      Scheduled Meds: . enoxaparin (LOVENOX) injection  40 mg Subcutaneous Q24H  . methylPREDNISolone (SOLU-MEDROL) injection  40 mg Intravenous Daily  . thiamine  100 mg Intravenous Daily   Continuous Infusions: . 0.9 % NaCl with KCl 20 mEq / L 30 mL/hr at 07/21/20 0214  . ampicillin-sulbactam (UNASYN) IV 3 g (07/23/20 0226)  . folic acid (FOLVITE) IVPB 1 mg (07/21/20 1210)     LOS: 5 days    Time spent: 30 mins    Wyvonnia Dusky, MD [facility] Pager [phone]  If 7PM-7AM, please contact night-coverage 07/23/2020, 7:32 AM

## 2020-07-23 NOTE — Clinical Social Work Note (Signed)
Sent secure email to [facility] hospital liaison to ask if patient is patient is eligible for SNF placement through the [place].  Charlynn Court, CSW [phone]

## 2020-07-23 NOTE — Progress Notes (Signed)
Daily Progress Note   Patient Name: William [name]       Date: 07/23/2020 DOB: 1956/07/09  Age: 65 y.o. MRN#: [id number] Attending Physician: Charise Killian, MD Primary Care Physician: [facility] Admit Date: 2020-08-06  Reason for Consultation/Follow-up: Establishing goals of care  Subjective: Per RN report, patient having a good day. [name] has been pleasant and accepting medications today. He is oriented but frustrated that he is still hospitalized. He is tolerating liquids from dysphagia diet.   GOC:  Spoke with Dr. Mayford Knife via secure chat. Also spoke with RN.   Patient's sister, Clydie Braun is documented HCPOA.  Spoke with Clydie Braun via telephone. She is appreciative of call and update. She spoke with Dr. Mayford Knife this afternoon and confirms her understanding of William [name]'s condition. Clydie Braun confirms that he has previously documented that he would NOT desire feeding tube/artificial nutrition. Discussed plan to initiate dysphagia 3 diet and continue aspiration precautions. Education provided on high risk for aspiration and complications that can occur. Clydie Braun understands. Discussed plan to hopefully get William [name] into [facility] rehab following hospitalization. Answered questions and concerns for sister.   Length of Stay: 5  Current Medications: Scheduled Meds:  . enoxaparin (LOVENOX) injection  40 mg Subcutaneous Q24H  . methylPREDNISolone (SOLU-MEDROL) injection  40 mg Intravenous Daily  . thiamine  100 mg Intravenous Daily    Continuous Infusions: . 0.9 % NaCl with KCl 20 mEq / L 30 mL/hr at 07/21/20 0214  . ampicillin-sulbactam (UNASYN) IV 3 g (07/23/20 1519)  . folic acid (FOLVITE) IVPB 1 mg (07/23/20 0955)    PRN Meds: acetaminophen **OR** acetaminophen, Ipratropium-Albuterol,  ondansetron **OR** ondansetron (ZOFRAN) IV  Physical Exam Vitals and nursing note reviewed.            Vital Signs: BP 103/86 (BP Location: Right Arm)   Pulse 100   Temp 97.8 F (36.6 C)   Resp 18   Ht 6\' 1"  (1.854 m)   Wt 80 kg   SpO2 99%   BMI 23.27 kg/m  SpO2: SpO2: 99 % O2 Device: O2 Device: Room Air O2 Flow Rate: O2 Flow Rate (L/min): 2 L/min  Intake/output summary:   Intake/Output Summary (Last 24 hours) at 07/23/2020 1538 Last data filed at 07/23/2020 0352 Gross per 24 hour  Intake 2635.95 ml  Output --  Net 2635.95  ml   LBM:   Baseline Weight: Weight: 80 kg Most recent weight: Weight: 80 kg       Palliative Assessment/Data: PPS 40%      Patient Active Problem List   Diagnosis Date Noted  . Pneumonia due to COVID-19 virus 07/18/2020  . Severe sepsis (HCC)   . Dysphagia   . Hyponatremia   . DNR (do not resuscitate)   . Alcohol withdrawal (HCC) 07/14/2020  . Leukocytopenia 07/12/2020  . CAP (community acquired pneumonia) 08/11/2020  . Tachycardia   . Hypokalemia   . Hepatic steatosis   . Acute respiratory failure with hypoxia (HCC)   . COPD with acute exacerbation (HCC)   . Acute metabolic encephalopathy 12/06/2019  . Wernicke's encephalopathy   . Wheeze   . Alcohol abuse   . Tobacco abuse   . Thrombocytopenia (HCC)   . Weakness     Palliative Care Assessment & Plan   Patient Profile: 65 y.o. male  with past medical history of Wernicke's encephalopathy, hypertension, ETOH and tobacco abuse admitted on 07/22/2020 after unwitnessed fall, found down by sister. Severe sepsis on admission due to aspiration pneumonia, COVID-19 pneumonia, acute metabolic encephalopathy with hx of ETOH abuse. CT head negative. Ongoing dysphagia. SLP recommending NPO status. Known to SLP from previous admission 02/07/20 when patient demonstrated severe oropharyngeal dysphagia with frank aspiration of all consistencies. Patient reportedly discharged on dysphagia 3, thin liquid  diet. Palliative medicine consultation for goals of care.    Assessment: Severe sepsis Aspiration pneumonia Acute metabolic encephalopathy Dysphagia Aspiration risk ETOH abuse Hyponatremia Tobacco abuse Thrombocytopenia  Recommendations/Plan: Continue current plan of care and medical management. Documented HCPOA/sister, [name]. Living will/HCPOA paperwork uploaded in EMR. Sister confirms that William [name] would not want feeding tube/artificial nutrition. Plan is to initiate dysphagia 3 diet. Aspiration precautions. Educated on aspiration risk.  TOC team following. Patient will need SNF rehab on discharge.  May benefit from outpatient palliative referral for ongoing support.   Code Status: DNR   Code Status Orders  (From admission, onward)         Start     Ordered   07/18/20 1256  Do not attempt resuscitation (DNR)  Continuous       Question Answer Comment  In the event of cardiac or respiratory ARREST Do not call a "code blue"   In the event of cardiac or respiratory ARREST Do not perform Intubation, CPR, defibrillation or ACLS   In the event of cardiac or respiratory ARREST Use medication by any route, position, wound care, and other measures to relive pain and suffering. May use oxygen, suction and manual treatment of airway obstruction as needed for comfort.      07/18/20 1255        Code Status History    Date Active Date Inactive Code Status Order ID Comments User Context   08/08/2020 2235 07/18/2020 1255 Full Code [id number]  Bobette Mo, MD ED   12/06/2019 1519 12/18/2019 2042 DNR [id number]  Alford Highland, MD ED   12/05/2019 1617 12/06/2019 1519 Full Code [id number]  Shaune Pollack, MD ED   11/15/2019 1454 11/17/2019 0858 Full Code [id number]  Lorre Nick, MD ED   Advance Care Planning Activity      Prognosis:  Unable to determine  Discharge Planning: Likely SNF rehab  Care plan was discussed with RN, Dr. Mayford Knife, sister/HCPOA Clydie Braun)  Thank you for  allowing the Palliative Medicine Team to assist in the care of this patient.   Total Time  20 Prolonged Time Billed  no      Greater than 50%  of this time was spent counseling and coordinating care related to the above assessment and plan.  The above conversation was completed via telephone due to visitor restrictions during COVID-19 pandemic. Thorough chart review and discussion with multidisciplinary team was completed as part of assessment. No physical examination was performed.   Vennie Homans, DNP, FNP-C Palliative Medicine Team  Phone: [phone] Fax: [phone]  Please contact Palliative Medicine Team phone at [phone] for questions and concerns.

## 2020-07-24 DIAGNOSIS — J1282 Pneumonia due to coronavirus disease 2019: Secondary | ICD-10-CM | POA: Diagnosis not present

## 2020-07-24 DIAGNOSIS — I4891 Unspecified atrial fibrillation: Secondary | ICD-10-CM | POA: Diagnosis not present

## 2020-07-24 DIAGNOSIS — U071 COVID-19: Secondary | ICD-10-CM | POA: Diagnosis not present

## 2020-07-24 DIAGNOSIS — F10230 Alcohol dependence with withdrawal, uncomplicated: Secondary | ICD-10-CM | POA: Diagnosis not present

## 2020-07-24 LAB — BASIC METABOLIC PANEL
Anion gap: 10 (ref 5–15)
BUN: 13 mg/dL (ref 8–23)
CO2: 27 mmol/L (ref 22–32)
Calcium: 8.2 mg/dL — ABNORMAL LOW (ref 8.9–10.3)
Chloride: 105 mmol/L (ref 98–111)
Creatinine, Ser: 0.41 mg/dL — ABNORMAL LOW (ref 0.61–1.24)
GFR, Estimated: >60 mL/min (ref >60)
Glucose, Bld: 99 mg/dL (ref 70–99)
Potassium: 3.6 mmol/L (ref 3.5–5.1)
Sodium: 142 mmol/L (ref 135–145)

## 2020-07-24 LAB — FERRITIN: Ferritin: 874 ng/mL — ABNORMAL HIGH (ref 24–336)

## 2020-07-24 LAB — CBC
HCT: 38.3 % — ABNORMAL LOW (ref 39.0–52.0)
Hemoglobin: 12.9 g/dL — ABNORMAL LOW (ref 13.0–17.0)
MCH: 34.9 pg — ABNORMAL HIGH (ref 26.0–34.0)
MCHC: 33.7 g/dL (ref 30.0–36.0)
MCV: 103.5 fL — ABNORMAL HIGH (ref 80.0–100.0)
Platelets: 91 10*3/uL — ABNORMAL LOW (ref 150–400)
RBC: 3.7 MIL/uL — ABNORMAL LOW (ref 4.22–5.81)
RDW: 12.5 % (ref 11.5–15.5)
WBC: 5.7 10*3/uL (ref 4.0–10.5)
nRBC: 0 % (ref 0.0–0.2)

## 2020-07-24 LAB — C-REACTIVE PROTEIN: CRP: 4.5 mg/dL — ABNORMAL HIGH (ref <1.0)

## 2020-07-24 MED ORDER — LORAZEPAM 2 MG/ML IJ SOLN
1.0000 mg | INTRAMUSCULAR | Status: DC | PRN
  Administered 2020-07-25 – 2020-07-29 (×6): 1 mg via INTRAVENOUS
  Filled 2020-07-24 (×6): qty 1

## 2020-07-24 MED ORDER — METOPROLOL TARTRATE 25 MG PO TABS
25.0000 mg | ORAL_TABLET | Freq: Two times a day (BID) | ORAL | Status: DC
  Administered 2020-07-24 – 2020-07-25 (×4): 25 mg via ORAL
  Filled 2020-07-24 (×4): qty 1

## 2020-07-24 MED ORDER — CHLORDIAZEPOXIDE HCL 25 MG PO CAPS
25.0000 mg | ORAL_CAPSULE | Freq: Three times a day (TID) | ORAL | Status: DC
  Administered 2020-07-24 – 2020-07-31 (×16): 25 mg via ORAL
  Filled 2020-07-24 (×18): qty 1

## 2020-07-24 NOTE — TOC Initial Note (Addendum)
Transition of Care [facility]) - Initial/Assessment Note    Patient Details  Name: William [name] MRN: [id number] Date of Birth: 04/22/1956  [facility] CM/SW Contact:    Margarito Liner, LCSW Phone Number: 07/24/2020, 2:27 PM  Clinical Narrative:  CSW called patient's sister, introduced role, and explained that PT recommendations would be discussed. She is agreeable to SNF placement. Explained that he is not service connected but might be able to go to SNF under a one-time contract. According to [facility] SNF list that [facility] social worker [facility], only [facility] in [place] and [facility] are options. If unable to go to SNF, sister unsure of plan. He was living in a mobile home on his sister's land prior to admission. His sister had paid $2000 to have it cleaned prior to last discharge but now says the home is in even worse condition. Patient smokes, drinks, and per sister cannot control bowels. Patient cannot return there. No further concerns. CSW encouraged patient's sister to contact CSW as needed. CSW will continue to follow patient for support and facilitate discharge once medically stable.   5:02 pm: CLC Screening checklist faxed to [place].  Expected Discharge Plan: Skilled Nursing Facility Barriers to Discharge: Continued Medical Work up,Inadequate or no insurance   Patient Goals and CMS Choice        Expected Discharge Plan and Services Expected Discharge Plan: Skilled Nursing Facility     Post Acute Care Choice: Skilled Nursing Facility Living arrangements for the past 2 months: Mobile Home                                      Prior Living Arrangements/Services Living arrangements for the past 2 months: Mobile Home Lives with:: Self Patient language and need for interpreter reviewed:: Yes        Need for Family Participation in Patient Care: Yes (Comment)     Criminal Activity/Legal Involvement Pertinent to Current Situation/Hospitalization:  No - Comment as needed  Activities of Daily Living Home Assistive Devices/Equipment: Other (Comment) (unknown) ADL Screening (condition at time of admission) Patient's cognitive ability adequate to safely complete daily activities?: No Is the patient deaf or have difficulty hearing?: Yes Does the patient have difficulty seeing, even when wearing glasses/contacts?: No Does the patient have difficulty concentrating, remembering, or making decisions?: Yes Patient able to express need for assistance with ADLs?: Yes Does the patient have difficulty dressing or bathing?: Yes Independently performs ADLs?: No Communication: Independent Dressing (OT): Dependent Is this a change from baseline?: Pre-admission baseline Grooming: Dependent Is this a change from baseline?: Pre-admission baseline Feeding: Needs assistance Is this a change from baseline?: Pre-admission baseline Bathing: Needs assistance Is this a change from baseline?: Pre-admission baseline Toileting: Dependent Is this a change from baseline?: Pre-admission baseline In/Out Bed: Dependent Is this a change from baseline?: Pre-admission baseline Walks in Home: Needs assistance Is this a change from baseline?: Pre-admission baseline Does the patient have difficulty walking or climbing stairs?: Yes Weakness of Legs: Both Weakness of Arms/Hands: None  Permission Sought/Granted Permission sought to share information with : Facility Contact Representative,Family Supports    Share Information with NAME: [name]  Permission granted to share info w AGENCY: [facility], SN[facility]'s  Permission granted to share info w Relationship: Sister  Permission granted to share info w Contact Information: [phone]  Emotional Assessment Appearance:: Appears stated age Attitude/Demeanor/Rapport: Unable to Assess Affect (  typically observed): Unable to Assess Orientation: : Oriented to Self,Oriented to Place Alcohol / Substance Use: Tobacco Use,Alcohol  Use Psych Involvement: No (comment)  Admission diagnosis:  Alcohol withdrawal (HCC) [F10.239] Dehydration [E86.0] Weakness [R53.1] Frequent falls [R29.6] Alcohol withdrawal syndrome without complication (HCC) [F10.230] Pneumonia due to COVID-19 virus [U07.1, J12.82] Patient Active Problem List   Diagnosis Date Noted  . Pneumonia due to COVID-19 virus 07/18/2020  . Severe sepsis (HCC)   . Dysphagia   . Hyponatremia   . DNR (do not resuscitate)   . Alcohol withdrawal (HCC) 07/26/2020  . Leukocytopenia 08/09/2020  . CAP (community acquired pneumonia) 07/27/2020  . Tachycardia   . Hypokalemia   . Hepatic steatosis   . Acute respiratory failure with hypoxia (HCC)   . COPD with acute exacerbation (HCC)   . Acute metabolic encephalopathy 12/06/2019  . Wernicke's encephalopathy   . Wheeze   . Alcohol abuse   . Tobacco abuse   . Thrombocytopenia (HCC)   . Weakness    PCP:  [facility] Pharmacy:   [facility] [phone] Judithann Sheen, [place] - [address] [place] [address] Phone: [phone] Fax: [phone]     Social Determinants of Health (SDOH) Interventions    Readmission Risk Interventions No flowsheet data found.

## 2020-07-24 NOTE — Progress Notes (Addendum)
PT Cancellation Note  Patient Details Name: William [name] MRN: [id number] DOB: [birth year]   Cancelled Treatment:    Reason Eval/Treat Not Completed: Fatigue/lethargy limiting ability to participate;Patient's level of consciousness (Pt refusing, reports he wants to dies. He says he will not get up today, he had a bad day yesterday.) Pt in bed upon entry, slid down in bed so far that both feet were 6 inches over the foot board, supported by achilles weight bearing on FOB. Pt had self doffed [place], received at 87% SpO2. IV alarm 'air in line' was blasting. Pt assisted with readjust in bed, sat up some. Pt does not remember author from prior 2 visits, nor did he remember author upon returning to the room 10 minutes later to update the RN.    Deren Degrazia C 07/24/2020, 3:42 PM

## 2020-07-24 NOTE — Progress Notes (Signed)
PROGRESS NOTE    William [name]  RFF:[id number] DOB: 07-29-1955 DOA: 07/16/2020 PCP: [facility]    Assessment & Plan:   Principal Problem:   Alcohol withdrawal ([place]) Active Problems:   Tobacco abuse   Thrombocytopenia ([place])   Leukocytopenia   CAP (community acquired pneumonia)   Pneumonia due to COVID-19 virus   Severe sepsis: met criteria w/ tachycardia, leukopenia, aspiration pneumonia & COVID-19 pneumonia. Continue w/ IV steroids & bronchodilators. Completed remdesivir course. Encourage incentive spirometry. Continue on IV unasyn for aspiration pneumonia x 7 days. Resolved   Pneumonia: secondary to COVID19 & aspiration. Continue on IV unasyn x 7 days, IV steroids, & bronchodilators. Encourage incentive spirometry.   Acute metabolic encephalopathy: with history of alcohol abuse/WE. CT head negative. Re-orient prn   Dysphagia: continue on dysphagia III diet as pt has assumed the risk of recurrent aspiration pneumonia.   A. Fib: new onset, w/ RVR. Started on metoprolol. Will hold anticoagulation as pt is a high fall risk  Alcohol abuse: w/ history of Warnicke's encephalopathy. Started on librium. IV ativan prn   Hypokalemia: WNL today. Will continue to monitor   Hyponatremia: resolved  Tobacco abuse: smoking cessation counseling   Leukopenia: resolved   Thrombocytopenia: likely secondary to alcohol abuse. Will continue to monitor    DVT prophylaxis: lovenox  Code Status: DNR Family Communication: Disposition Plan: likely d/c to SNF  Status is: Inpatient  Remains inpatient appropriate because:Altered mental status, IV treatments appropriate due to intensity of illness or inability to take PO and Inpatient level of care appropriate due to severity of illness   Dispo: The patient is from: Home              Anticipated d/c is to: SNF              Anticipated d/c date is: 2 days               Patient currently is not medically stable to  d/c.      Consultants:   Palliative care   Procedures:    Antimicrobials:    Subjective: Pt c/o nausea  Objective: Vitals:   07/24/20 0002 07/24/20 0449 07/24/20 0731 07/24/20 1150  BP: 101/86 (!) 105/92 105/74 102/73  Pulse: 96 93 (!) 104 92  Resp: _0 Temp: 97.6 F (36.4 C) 97.9 F (36.6 C) 97.6 F (36.4 C) 98.7 F (37.1 C)  TempSrc:      SpO2: 98% 100% 95% 100%  Weight:      Height:       No intake or output data in the 24 hours ending 07/24/20 1237 Filed Weights   07/18/20 1120  Weight: 80 kg    Examination:  General exam: Appears uncomfortable. Frail appearing. Appears older than stated age  Respiratory system: diminished breath sounds b/l.  Cardiovascular system: S1/S2+. No rubs or clicks   Gastrointestinal system: Abd is soft, NT, ND & hypoactive bowel sounds  Central nervous system: Alert and awake. Moves all 4 extremities  Psychiatry: Judgement and insight appear abnormal. Flat mood and affect     Data Reviewed: I have personally reviewed following labs and imaging studies  CBC: Recent Labs  Lab 07/19/20 0720 07/20/20 0503 07/21/20 0549 07/22/20 0528 07/23/20 0430 07/24/20 0612  WBC 6.1 6.1 4.0 4.2 4.3 5.7  NEUTROABS 5.3 5.5 3.4 3.1 2.9  --   HGB 12.9* 13.1 13.6 12.9* 13.5 12.9*  HCT 38.0* 37.3* 39.8 37.7* 39.4 38.3*  MCV 104.4*  102.2* 103.1* 102.4* 102.9* 103.5*  PLT 83* 94* 92* 90* 87* 91*   Basic Metabolic Panel: Recent Labs  Lab 07/20/2020 2112 07/18/20 0557 07/20/20 0503 07/21/20 0549 07/22/20 0528 07/23/20 0430 07/24/20 0612  NA  --    < > 143 146* 144 144 142  K  --    < > 3.9 3.2* 2.9* 3.3* 3.6  CL  --    < > 109 107 106 106 105  CO2  --    < > _0 GLUCOSE  --    < > 121* 114* 82 80 99  BUN  --    < > _1 CREATININE  --    < > 0.34* 0.31* <0.30* 0.47* 0.41*  CALCIUM  --    < > 8.3* 8.1* 8.1* 7.8* 8.2*  MG 1.8  --   --   --   --   --   --   PHOS 2.5  --   --   --   --   --   --     < > = values in this interval not displayed.   GFR: Estimated Creatinine Clearance: 105.4 mL/min (A) (by C-G formula based on SCr of 0.41 mg/dL (L)). Liver Function Tests: Recent Labs  Lab 07/19/20 0720 07/20/20 0503 07/21/20 0549 07/22/20 0528 07/23/20 0430  AST 54* 34 _2 ALT _3 ALKPHOS 58 56 59 60 68  BILITOT 1.1 1.1 1.3* 1.2 1.5*  PROT 5.2* 5.2* 5.7* 5.3* 5.2*  ALBUMIN 2.2* 2.3* 2.3* 2.3* 2.2*   No results for input(s): LIPASE, AMYLASE in the last 168 hours. No results for input(s): AMMONIA in the last 168 hours. Coagulation Profile: No results for input(s): INR, PROTIME in the last 168 hours. Cardiac Enzymes: Recent Labs  Lab 07/16/2020 1732  CKTOTAL 188   BNP (last 3 results) No results for input(s): PROBNP in the last 8760 hours. HbA1C: No results for input(s): HGBA1C in the last 72 hours. CBG: No results for input(s): GLUCAP in the last 168 hours. Lipid Profile: No results for input(s): CHOL, HDL, LDLCALC, TRIG, CHOLHDL, LDLDIRECT in the last 72 hours. Thyroid Function Tests: No results for input(s): TSH, T4TOTAL, FREET4, T3FREE, THYROIDAB in the last 72 hours. Anemia Panel: Recent Labs    07/24/20 0612  FERRITIN 874*   Sepsis Labs: Recent Labs  Lab 07/16/2020 2112 07/18/20 0557  PROCALCITON <0.10 0.47  LATICACIDVEN 1.4  --     Recent Results (from the past 240 hour(s))  Culture, blood (routine x 2)     Status: None   Collection Time: 07/18/20  8:13 AM   Specimen: BLOOD RIGHT HAND  Result Value Ref Range Status   Specimen Description BLOOD RIGHT HAND  Final   Special Requests   Final    BOTTLES DRAWN AEROBIC AND ANAEROBIC Blood Culture adequate volume   Culture   Final    NO GROWTH 5 DAYS Performed at [facility], [address] [place], [place] [address]    Report Status 07/23/2020 FINAL  Final  Resp Panel by RT-PCR (Flu A&B, Covid) Nasopharyngeal Swab     Status: Abnormal   Collection Time: 07/18/20  9:06 AM    Specimen: Nasopharyngeal Swab; Nasopharyngeal(NP) swabs in vial transport medium  Result Value Ref Range Status   SARS Coronavirus 2 by RT PCR POSITIVE (A) NEGATIVE Final    Comment: RESULT CALLED TO, READ BACK  BY AND VERIFIED WITH: REINA GALJOUR AT 7639 07/18/20.PMF (NOTE) SARS-CoV-2 target nucleic acids are DETECTED.  The SARS-CoV-2 RNA is generally detectable in upper respiratory specimens during the acute phase of infection. Positive results are indicative of the presence of the identified virus, but do not rule out bacterial infection or co-infection with other pathogens not detected by the test. Clinical correlation with patient history and other diagnostic information is necessary to determine patient infection status. The expected result is Negative.  Fact Sheet for Patients: EntrepreneurPulse.com.au  Fact Sheet for Healthcare Providers: IncredibleEmployment.be  This test is not yet approved or cleared by the [place] FDA and  has been authorized for detection and/or diagnosis of SARS-CoV-2 by FDA under an Emergency Use Authorization (EUA).  This EUA will remain in effect (meaning this test can be  used) for the duration of  the COVID-19 declaration under Section 564(b)(1) of the Act, 21 U.S.C. section 360bbb-3(b)(1), unless the authorization is terminated or revoked sooner.     Influenza A by PCR NEGATIVE NEGATIVE Final   Influenza B by PCR NEGATIVE NEGATIVE Final    Comment: (NOTE) The Xpert Xpress SARS-CoV-2/FLU/RSV plus assay is intended as an aid in the diagnosis of influenza from Nasopharyngeal swab specimens and should not be used as a sole basis for treatment. Nasal washings and aspirates are unacceptable for Xpert Xpress SARS-CoV-2/FLU/RSV testing.  Fact Sheet for Patients: EntrepreneurPulse.com.au  Fact Sheet for Healthcare Providers: IncredibleEmployment.be  This test is not yet  approved or cleared by the [place] FDA and has been authorized for detection and/or diagnosis of SARS-CoV-2 by FDA under an Emergency Use Authorization (EUA). This EUA will remain in effect (meaning this test can be used) for the duration of the COVID-19 declaration under Section 564(b)(1) of the Act, 21 U.S.C. section 360bbb-3(b)(1), unless the authorization is terminated or revoked.  Performed at [facility], [address] [place], [place] [address]   Culture, blood (routine x 2)     Status: None   Collection Time: 07/18/20  1:00 PM   Specimen: BLOOD  Result Value Ref Range Status   Specimen Description BLOOD LEFT ANTECUBITAL  Final   Special Requests   Final    BOTTLES DRAWN AEROBIC AND ANAEROBIC Blood Culture results may not be optimal due to an inadequate volume of blood received in culture bottles   Culture   Final    NO GROWTH 5 DAYS Performed at [facility], [address] [place],  [address]    Report Status 07/23/2020 FINAL  Final         Radiology Studies: No results found.      Scheduled Meds: . chlordiazePOXIDE  25 mg Oral TID  . enoxaparin (LOVENOX) injection  40 mg Subcutaneous Q24H  . methylPREDNISolone (SOLU-MEDROL) injection  40 mg Intravenous Daily  . metoprolol tartrate  25 mg Oral BID  . thiamine  100 mg Intravenous Daily   Continuous Infusions: . 0.9 % NaCl with KCl 20 mEq / L 30 mL/hr at 07/23/20 2215  . ampicillin-sulbactam (UNASYN) IV 3 g (07/24/20 1135)  . folic acid (FOLVITE) IVPB Stopped (07/23/20 1911)     LOS: 6 days    Time spent: 30 mins    Wyvonnia Dusky, MD [facility] Pager [phone]  If 7PM-7AM, please contact night-coverage 07/24/2020, 12:37 PM

## 2020-07-24 NOTE — Clinical Social Work Note (Signed)
Received call back from [place], CSW at [facility]. Patient is not service connected but may be able to do a [facility] contract [facility] a [facility]. Leavy Cella will send list to CSW to review. Will discuss with sister today.  Charlynn Court, CSW [phone]

## 2020-07-24 NOTE — Progress Notes (Signed)
Pt with new onset Afib RVR. No c/o pain. Skin dry and cool. Pt A&O person place. No change from previous assessment. MD notified. Metoprolol BID ordered.

## 2020-07-25 DIAGNOSIS — U071 COVID-19: Secondary | ICD-10-CM | POA: Diagnosis not present

## 2020-07-25 DIAGNOSIS — I4891 Unspecified atrial fibrillation: Secondary | ICD-10-CM | POA: Diagnosis not present

## 2020-07-25 DIAGNOSIS — J1282 Pneumonia due to coronavirus disease 2019: Secondary | ICD-10-CM | POA: Diagnosis not present

## 2020-07-25 DIAGNOSIS — F10230 Alcohol dependence with withdrawal, uncomplicated: Secondary | ICD-10-CM | POA: Diagnosis not present

## 2020-07-25 LAB — CBC
HCT: 37.6 % — ABNORMAL LOW (ref 39.0–52.0)
Hemoglobin: 12.3 g/dL — ABNORMAL LOW (ref 13.0–17.0)
MCH: 34.4 pg — ABNORMAL HIGH (ref 26.0–34.0)
MCHC: 32.7 g/dL (ref 30.0–36.0)
MCV: 105 fL — ABNORMAL HIGH (ref 80.0–100.0)
Platelets: 111 10*3/uL — ABNORMAL LOW (ref 150–400)
RBC: 3.58 MIL/uL — ABNORMAL LOW (ref 4.22–5.81)
RDW: 12.4 % (ref 11.5–15.5)
WBC: 3.2 10*3/uL — ABNORMAL LOW (ref 4.0–10.5)
nRBC: 0 % (ref 0.0–0.2)

## 2020-07-25 LAB — BASIC METABOLIC PANEL
Anion gap: 7 (ref 5–15)
BUN: 13 mg/dL (ref 8–23)
CO2: 30 mmol/L (ref 22–32)
Calcium: 8 mg/dL — ABNORMAL LOW (ref 8.9–10.3)
Chloride: 106 mmol/L (ref 98–111)
Creatinine, Ser: 0.45 mg/dL — ABNORMAL LOW (ref 0.61–1.24)
GFR, Estimated: >60 mL/min (ref >60)
Glucose, Bld: 104 mg/dL — ABNORMAL HIGH (ref 70–99)
Potassium: 3.6 mmol/L (ref 3.5–5.1)
Sodium: 143 mmol/L (ref 135–145)

## 2020-07-25 LAB — C-REACTIVE PROTEIN: CRP: 3.8 mg/dL — ABNORMAL HIGH (ref <1.0)

## 2020-07-25 LAB — FERRITIN: Ferritin: 864 ng/mL — ABNORMAL HIGH (ref 24–336)

## 2020-07-25 MED ORDER — METHYLPREDNISOLONE SODIUM SUCC 40 MG IJ SOLR
20.0000 mg | Freq: Every day | INTRAMUSCULAR | Status: DC
  Administered 2020-07-26 – 2020-07-28 (×3): 20 mg via INTRAVENOUS
  Filled 2020-07-25 (×3): qty 1

## 2020-07-25 NOTE — Progress Notes (Addendum)
PROGRESS NOTE    William [name]  QIO:[id number] DOB: [birth year] DOA: 07/27/2020 PCP: [facility]    Assessment & Plan:   Principal Problem:   Alcohol withdrawal ([place]) Active Problems:   Tobacco abuse   Thrombocytopenia ([place])   Leukocytopenia   CAP (community acquired pneumonia)   Pneumonia due to COVID-19 virus   Severe sepsis: met criteria w/ tachycardia, leukopenia, aspiration pneumonia & COVID-19 pneumonia. Resolved  Pneumonia: secondary to COVID19 & aspiration. Continue on IV unasyn x 7 days. Started on IV steroid taper. Completed remdesivir course. Continue on bronchodilators & encourage incentive spirometry   Acute metabolic encephalopathy: w/ hx of alcohol abuse/WE. CT head is neg. Re-orient prn   Dysphagia: continue on dysphagia III diet as pt has assumed the risk of recurrent aspiration pneumonia.   A. Fib: new onset & initially w/ RVR. Back in sinus rhythm. Continue on metoprolol. No anticoagulation secondary to high fall risk   Alcohol abuse: w/ history of Warnicke's encephalopathy. Continue on librium. IV ativan prn   Hypokalemia: WNL today   Hyponatremia: resolved  Tobacco abuse: smoking cessation counseling.   Leukopenia: labile. Will continue to monitor   Thrombocytopenia: likely secondary to alcohol abuse.     DVT prophylaxis: lovenox  Code Status: DNR Family Communication: Disposition Plan: likely d/c to SNF  Status is: Inpatient  Remains inpatient appropriate because:Altered mental status, IV treatments appropriate due to intensity of illness or inability to take PO and Inpatient level of care appropriate due to severity of illness   Dispo: The patient is from: Home              Anticipated d/c is to: SNF              Anticipated d/c date is: 1 day              Patient currently is not medically stable for d/c       Consultants:   Palliative care   Procedures:    Antimicrobials:    Subjective: Pt is quite  lethargic. Pt is not answering questions much this morning   Objective: Vitals:   07/24/20 1709 07/24/20 2033 07/24/20 2353 07/25/20 0500  BP: 94/71 (!) 89/74 97/70 102/72  Pulse: 72 73 75 79  Resp: 16 18 18 18   Temp: 98.4 F (36.9 C) 98.2 F (36.8 C) 98.4 F (36.9 C) 98.4 F (36.9 C)  TempSrc:      SpO2: 95% (!) 87% 96% 96%  Weight:      Height:       No intake or output data in the 24 hours ending 07/25/20 0718 Filed Weights   07/18/20 1120  Weight: 80 kg    Examination:  General exam: Appears lethargic. Appears older than stated age  Respiratory system: decreased breath sounds b/l  Cardiovascular system: S1 & S2+. No clicks or gallops   Gastrointestinal system: Abd is soft, NT, ND & normal bowel sounds  Central nervous system: Lethargic. Moves all 4 extremities  Psychiatry: Judgement and insight appear abnormal. Flat mood and affect     Data Reviewed: I have personally reviewed following labs and imaging studies  CBC: Recent Labs  Lab 07/19/20 0720 07/20/20 0503 07/21/20 0549 07/22/20 0528 07/23/20 0430 07/24/20 0612 07/25/20 0344  WBC 6.1 6.1 4.0 4.2 4.3 5.7 3.2*  NEUTROABS 5.3 5.5 3.4 3.1 2.9  --   --   HGB 12.9* 13.1 13.6 12.9* 13.5 12.9* 12.3*  HCT 38.0* 37.3* 39.8 37.7* 39.4 38.3* 37.6*  MCV 104.4* 102.2* 103.1* 102.4* 102.9* 103.5* 105.0*  PLT 83* 94* 92* 90* 87* 91* 951*   Basic Metabolic Panel: Recent Labs  Lab 07/21/20 0549 07/22/20 0528 07/23/20 0430 07/24/20 0612 07/25/20 0344  NA 146* 144 144 142 143  K 3.2* 2.9* 3.3* 3.6 3.6  CL 107 106 106 105 106  CO2 26 26 29 27 30   GLUCOSE 114* 82 80 99 104*  BUN 12 10 10 13 13   CREATININE 0.31* <0.30* 0.47* 0.41* 0.45*  CALCIUM 8.1* 8.1* 7.8* 8.2* 8.0*   GFR: Estimated Creatinine Clearance: 105.4 mL/min (A) (by C-G formula based on SCr of 0.45 mg/dL (L)). Liver Function Tests: Recent Labs  Lab 07/19/20 0720 07/20/20 0503 07/21/20 0549 07/22/20 0528 07/23/20 0430  AST 54* 34 29 28 25    ALT 23 21 19 17 15   ALKPHOS 58 56 59 60 68  BILITOT 1.1 1.1 1.3* 1.2 1.5*  PROT 5.2* 5.2* 5.7* 5.3* 5.2*  ALBUMIN 2.2* 2.3* 2.3* 2.3* 2.2*   No results for input(s): LIPASE, AMYLASE in the last 168 hours. No results for input(s): AMMONIA in the last 168 hours. Coagulation Profile: No results for input(s): INR, PROTIME in the last 168 hours. Cardiac Enzymes: No results for input(s): CKTOTAL, CKMB, CKMBINDEX, TROPONINI in the last 168 hours. BNP (last 3 results) No results for input(s): PROBNP in the last 8760 hours. HbA1C: No results for input(s): HGBA1C in the last 72 hours. CBG: No results for input(s): GLUCAP in the last 168 hours. Lipid Profile: No results for input(s): CHOL, HDL, LDLCALC, TRIG, CHOLHDL, LDLDIRECT in the last 72 hours. Thyroid Function Tests: No results for input(s): TSH, T4TOTAL, FREET4, T3FREE, THYROIDAB in the last 72 hours. Anemia Panel: Recent Labs    07/24/20 0612 07/25/20 0344  FERRITIN 874* 864*   Sepsis Labs: No results for input(s): PROCALCITON, LATICACIDVEN in the last 168 hours.  Recent Results (from the past 240 hour(s))  Culture, blood (routine x 2)     Status: None   Collection Time: 07/18/20  8:13 AM   Specimen: BLOOD RIGHT HAND  Result Value Ref Range Status   Specimen Description BLOOD RIGHT HAND  Final   Special Requests   Final    BOTTLES DRAWN AEROBIC AND ANAEROBIC Blood Culture adequate volume   Culture   Final    NO GROWTH 5 DAYS Performed at [facility], [address] [place], [place] [address]    Report Status 07/23/2020 FINAL  Final  Resp Panel by RT-PCR (Flu A&B, Covid) Nasopharyngeal Swab     Status: Abnormal   Collection Time: 07/18/20  9:06 AM   Specimen: Nasopharyngeal Swab; Nasopharyngeal(NP) swabs in vial transport medium  Result Value Ref Range Status   SARS Coronavirus 2 by RT PCR POSITIVE (A) NEGATIVE Final    Comment: RESULT CALLED TO, READ BACK BY AND VERIFIED WITH: REINA GALJOUR AT 6063  07/18/20.PMF (NOTE) SARS-CoV-2 target nucleic acids are DETECTED.  The SARS-CoV-2 RNA is generally detectable in upper respiratory specimens during the acute phase of infection. Positive results are indicative of the presence of the identified virus, but do not rule out bacterial infection or co-infection with other pathogens not detected by the test. Clinical correlation with patient history and other diagnostic information is necessary to determine patient infection status. The expected result is Negative.  Fact Sheet for Patients: EntrepreneurPulse.com.au  Fact Sheet for Healthcare Providers: IncredibleEmployment.be  This test is not yet approved or cleared by the [place] FDA and  has been  authorized for detection and/or diagnosis of SARS-CoV-2 by FDA under an Emergency Use Authorization (EUA).  This EUA will remain in effect (meaning this test can be  used) for the duration of  the COVID-19 declaration under Section 564(b)(1) of the Act, 21 U.S.C. section 360bbb-3(b)(1), unless the authorization is terminated or revoked sooner.     Influenza A by PCR NEGATIVE NEGATIVE Final   Influenza B by PCR NEGATIVE NEGATIVE Final    Comment: (NOTE) The Xpert Xpress SARS-CoV-2/FLU/RSV plus assay is intended as an aid in the diagnosis of influenza from Nasopharyngeal swab specimens and should not be used as a sole basis for treatment. Nasal washings and aspirates are unacceptable for Xpert Xpress SARS-CoV-2/FLU/RSV testing.  Fact Sheet for Patients: EntrepreneurPulse.com.au  Fact Sheet for Healthcare Providers: IncredibleEmployment.be  This test is not yet approved or cleared by the [place] FDA and has been authorized for detection and/or diagnosis of SARS-CoV-2 by FDA under an Emergency Use Authorization (EUA). This EUA will remain in effect (meaning this test can be used) for the duration of  the COVID-19 declaration under Section 564(b)(1) of the Act, 21 U.S.C. section 360bbb-3(b)(1), unless the authorization is terminated or revoked.  Performed at [facility], [address] [place], [place] [address]   Culture, blood (routine x 2)     Status: None   Collection Time: 07/18/20  1:00 PM   Specimen: BLOOD  Result Value Ref Range Status   Specimen Description BLOOD LEFT ANTECUBITAL  Final   Special Requests   Final    BOTTLES DRAWN AEROBIC AND ANAEROBIC Blood Culture results may not be optimal due to an inadequate volume of blood received in culture bottles   Culture   Final    NO GROWTH 5 DAYS Performed at [facility], [address], [place], [place] [address]    Report Status 07/23/2020 FINAL  Final         Radiology Studies: No results found.      Scheduled Meds: . chlordiazePOXIDE  25 mg Oral TID  . enoxaparin (LOVENOX) injection  40 mg Subcutaneous Q24H  . methylPREDNISolone (SOLU-MEDROL) injection  40 mg Intravenous Daily  . metoprolol tartrate  25 mg Oral BID  . thiamine  100 mg Intravenous Daily   Continuous Infusions: . 0.9 % NaCl with KCl 20 mEq / L 30 mL/hr at 07/23/20 2215  . ampicillin-sulbactam (UNASYN) IV 3 g (07/25/20 0616)  . folic acid (FOLVITE) IVPB Stopped (07/24/20 2342)     LOS: 7 days    Time spent: 31 mins    Wyvonnia Dusky, MD [facility] Pager [phone]  If 7PM-7AM, please contact night-coverage 07/25/2020, 7:18 AM

## 2020-07-25 NOTE — Progress Notes (Signed)
[facility], contracting hospital Liaison note:  New referral for [employer] community based Palliative to follow post discharge received from Palliative NP Vennie Homans.  TOC Meagan Hagwood aware.  Liaison will follow for discharge disposition. Thank you. Dayna Barker BSN, RN, [facility] [employer] [phone]

## 2020-07-26 DIAGNOSIS — Z7189 Other specified counseling: Secondary | ICD-10-CM

## 2020-07-26 DIAGNOSIS — R1310 Dysphagia, unspecified: Secondary | ICD-10-CM | POA: Diagnosis not present

## 2020-07-26 DIAGNOSIS — U071 COVID-19: Secondary | ICD-10-CM | POA: Diagnosis not present

## 2020-07-26 DIAGNOSIS — F10230 Alcohol dependence with withdrawal, uncomplicated: Secondary | ICD-10-CM | POA: Diagnosis not present

## 2020-07-26 LAB — FERRITIN: Ferritin: 699 ng/mL — ABNORMAL HIGH (ref 24–336)

## 2020-07-26 LAB — BASIC METABOLIC PANEL
Anion gap: 8 (ref 5–15)
BUN: 13 mg/dL (ref 8–23)
CO2: 29 mmol/L (ref 22–32)
Calcium: 7.9 mg/dL — ABNORMAL LOW (ref 8.9–10.3)
Chloride: 108 mmol/L (ref 98–111)
Creatinine, Ser: 0.39 mg/dL — ABNORMAL LOW (ref 0.61–1.24)
GFR, Estimated: >60 mL/min (ref >60)
Glucose, Bld: 97 mg/dL (ref 70–99)
Potassium: 3.4 mmol/L — ABNORMAL LOW (ref 3.5–5.1)
Sodium: 145 mmol/L (ref 135–145)

## 2020-07-26 LAB — CBC
HCT: 36.4 % — ABNORMAL LOW (ref 39.0–52.0)
Hemoglobin: 12.3 g/dL — ABNORMAL LOW (ref 13.0–17.0)
MCH: 34.9 pg — ABNORMAL HIGH (ref 26.0–34.0)
MCHC: 33.8 g/dL (ref 30.0–36.0)
MCV: 103.4 fL — ABNORMAL HIGH (ref 80.0–100.0)
Platelets: 110 10*3/uL — ABNORMAL LOW (ref 150–400)
RBC: 3.52 MIL/uL — ABNORMAL LOW (ref 4.22–5.81)
RDW: 12.7 % (ref 11.5–15.5)
WBC: 4.7 10*3/uL (ref 4.0–10.5)
nRBC: 0 % (ref 0.0–0.2)

## 2020-07-26 LAB — C-REACTIVE PROTEIN: CRP: 1.7 mg/dL — ABNORMAL HIGH (ref <1.0)

## 2020-07-26 LAB — HEMOGLOBIN AND HEMATOCRIT, BLOOD
HCT: 34.8 % — ABNORMAL LOW (ref 39.0–52.0)
Hemoglobin: 11.5 g/dL — ABNORMAL LOW (ref 13.0–17.0)

## 2020-07-26 LAB — MAGNESIUM: Magnesium: 2.2 mg/dL (ref 1.7–2.4)

## 2020-07-26 MED ORDER — AMIODARONE HCL 200 MG PO TABS
200.0000 mg | ORAL_TABLET | Freq: Two times a day (BID) | ORAL | Status: DC
  Administered 2020-07-26: 09:00:00 200 mg via ORAL
  Filled 2020-07-26 (×2): qty 1

## 2020-07-26 MED ORDER — SODIUM CHLORIDE 0.9 % IV SOLN
INTRAVENOUS | Status: DC | PRN
  Administered 2020-07-26 – 2020-07-27 (×2): 250 mL via INTRAVENOUS

## 2020-07-26 MED ORDER — POTASSIUM CHLORIDE CRYS ER 20 MEQ PO TBCR
40.0000 meq | EXTENDED_RELEASE_TABLET | Freq: Once | ORAL | Status: AC
  Administered 2020-07-26: 13:00:00 40 meq via ORAL
  Filled 2020-07-26: qty 2

## 2020-07-26 MED ORDER — METOPROLOL TARTRATE 5 MG/5ML IV SOLN
5.0000 mg | Freq: Three times a day (TID) | INTRAVENOUS | Status: DC | PRN
  Administered 2020-07-26: 08:00:00 5 mg via INTRAVENOUS
  Filled 2020-07-26: qty 5

## 2020-07-26 NOTE — Significant Event (Signed)
Rapid Response Event Note   Reason for Call : called for pt running HR 130's on monitor.   Initial Focused Assessment: Laying in bed, no distress.      Interventions: Dr Mayford Knife notified. Asked RN Reva Bores to go ahead and give pts scheduled metoprolol.   Plan of Care: William [name] to call for further assistance.    Event Summary:   MD Notified: 0730 Call Time:0725 Arrival Time:0730 End OTLX:[id number]  Tiah Heckel A, RN

## 2020-07-26 NOTE — Progress Notes (Signed)
Notified Dr. Mayford Knife of hematuria and that lovenox is due.  Per MD ok to give lovenox.

## 2020-07-26 NOTE — Progress Notes (Signed)
PROGRESS NOTE    William [name]  EUM:[id number] DOB: [birth year] DOA: 08/06/2020 PCP: [facility]    Assessment & Plan:   Principal Problem:   Alcohol withdrawal ([place]) Active Problems:   Tobacco abuse   Thrombocytopenia ([place])   Leukocytopenia   CAP (community acquired pneumonia)   Pneumonia due to COVID-19 virus   Severe sepsis: met criteria w/ tachycardia, leukopenia, aspiration pneumonia & COVID-19 pneumonia. Resolved  Pneumonia: secondary to COVID19 & aspiration. Will complete abx course today. Continue on IV steroid taper. Completed remdesivir course. Continue on bronchodilators & encourage incentive spirometry   Acute metabolic encephalopathy: w/ hx of alcohol abuse/WE. CT head is neg. Re-orient prn   Dysphagia: continue on dysphagia III diet as pt has assumed the risk of recurrent aspiration pneumonia   A. Fib: new and intermittent RVR. D/c metoprolol & start po amiodarone. No anticoagulation secondary to fall risk. Will consult cardio in AM   Alcohol abuse: w/ history of Warnicke's encephalopathy. Continue on librium. IV ativan prn   Hypokalemia: KCl repleted. Will continue to monitor   Hyponatremia: resolved  Tobacco abuse: smoking cessation counseling    Leukopenia: resolved   Thrombocytopenia: likely secondary to alcohol abuse    DVT prophylaxis: lovenox  Code Status: DNR Family Communication: Disposition Plan: likely d/c to SNF  Status is: Inpatient  Remains inpatient appropriate because:Altered mental status, IV treatments appropriate due to intensity of illness or inability to take PO and Inpatient level of care appropriate due to severity of illness, waiting on SNF placement still. A.fib w/ RVR this morning    Dispo: The patient is from: Home              Anticipated d/c is to: SNF              Anticipated d/c date is: 2 days              Patient currently is not medically stable for d/c       Consultants:   Palliative  care   Procedures:    Antimicrobials:    Subjective: Pt denies any palpitations or pain.   Objective: Vitals:   07/26/20 1030 07/26/20 1031 07/26/20 1031 07/26/20 1130  BP:   96/75 101/78  Pulse:   74 67  Resp: (!) 23 (!) 22 20 20   Temp:   98.4 F (36.9 C) 98.2 F (36.8 C)  TempSrc:      SpO2:   98% 100%  Weight:      Height:        Intake/Output Summary (Last 24 hours) at 07/26/2020 1248 Last data filed at 07/26/2020 1030 Gross per 24 hour  Intake 771.05 ml  Output 400 ml  Net 371.05 ml   Filed Weights   07/18/20 1120  Weight: 80 kg    Examination:  General exam: Appears older than stated age. Appears comfortable  Respiratory system: diminished breath sounds b/l  Cardiovascular system: Irregularly irregular. No clicks or gallops  Gastrointestinal system: Abd is soft, NT, ND & normal bowel sounds  Central nervous system: Moves all 4 extremities  Psychiatry: Judgement and insight appear abnormal. Flat mood and affect     Data Reviewed: I have personally reviewed following labs and imaging studies  CBC: Recent Labs  Lab 07/20/20 0503 07/21/20 0549 07/22/20 0528 07/23/20 0430 07/24/20 0612 07/25/20 0344 07/26/20 0659  WBC 6.1 4.0 4.2 4.3 5.7 3.2* 4.7  NEUTROABS 5.5 3.4 3.1 2.9  --   --   --  HGB 13.1 13.6 12.9* 13.5 12.9* 12.3* 12.3*  HCT 37.3* 39.8 37.7* 39.4 38.3* 37.6* 36.4*  MCV 102.2* 103.1* 102.4* 102.9* 103.5* 105.0* 103.4*  PLT 94* 92* 90* 87* 91* 111* 458*   Basic Metabolic Panel: Recent Labs  Lab 07/22/20 0528 07/23/20 0430 07/24/20 0612 07/25/20 0344 07/26/20 0659  NA 144 144 142 143 145  K 2.9* 3.3* 3.6 3.6 3.4*  CL 106 106 105 106 108  CO2 26 29 27 30 29   GLUCOSE 82 80 99 104* 97  BUN 10 10 13 13 13   CREATININE <0.30* 0.47* 0.41* 0.45* 0.39*  CALCIUM 8.1* 7.8* 8.2* 8.0* 7.9*  MG  --   --   --   --  2.2   GFR: Estimated Creatinine Clearance: 105.4 mL/min (A) (by C-G formula based on SCr of 0.39 mg/dL (L)). Liver Function  Tests: Recent Labs  Lab 07/20/20 0503 07/21/20 0549 07/22/20 0528 07/23/20 0430  AST 34 29 28 25   ALT 21 19 17 15   ALKPHOS 56 59 60 68  BILITOT 1.1 1.3* 1.2 1.5*  PROT 5.2* 5.7* 5.3* 5.2*  ALBUMIN 2.3* 2.3* 2.3* 2.2*   No results for input(s): LIPASE, AMYLASE in the last 168 hours. No results for input(s): AMMONIA in the last 168 hours. Coagulation Profile: No results for input(s): INR, PROTIME in the last 168 hours. Cardiac Enzymes: No results for input(s): CKTOTAL, CKMB, CKMBINDEX, TROPONINI in the last 168 hours. BNP (last 3 results) No results for input(s): PROBNP in the last 8760 hours. HbA1C: No results for input(s): HGBA1C in the last 72 hours. CBG: No results for input(s): GLUCAP in the last 168 hours. Lipid Profile: No results for input(s): CHOL, HDL, LDLCALC, TRIG, CHOLHDL, LDLDIRECT in the last 72 hours. Thyroid Function Tests: No results for input(s): TSH, T4TOTAL, FREET4, T3FREE, THYROIDAB in the last 72 hours. Anemia Panel: Recent Labs    07/25/20 0344 07/26/20 0659  FERRITIN 864* 699*   Sepsis Labs: No results for input(s): PROCALCITON, LATICACIDVEN in the last 168 hours.  Recent Results (from the past 240 hour(s))  Culture, blood (routine x 2)     Status: None   Collection Time: 07/18/20  8:13 AM   Specimen: BLOOD RIGHT HAND  Result Value Ref Range Status   Specimen Description BLOOD RIGHT HAND  Final   Special Requests   Final    BOTTLES DRAWN AEROBIC AND ANAEROBIC Blood Culture adequate volume   Culture   Final    NO GROWTH 5 DAYS Performed at [facility], [address] [place], [place] [address]    Report Status 07/23/2020 FINAL  Final  Resp Panel by RT-PCR (Flu A&B, Covid) Nasopharyngeal Swab     Status: Abnormal   Collection Time: 07/18/20  9:06 AM   Specimen: Nasopharyngeal Swab; Nasopharyngeal(NP) swabs in vial transport medium  Result Value Ref Range Status   SARS Coronavirus 2 by RT PCR POSITIVE (A) NEGATIVE Final     Comment: RESULT CALLED TO, READ BACK BY AND VERIFIED WITH: REINA GALJOUR AT 4462 07/18/20.PMF (NOTE) SARS-CoV-2 target nucleic acids are DETECTED.  The SARS-CoV-2 RNA is generally detectable in upper respiratory specimens during the acute phase of infection. Positive results are indicative of the presence of the identified virus, but do not rule out bacterial infection or co-infection with other pathogens not detected by the test. Clinical correlation with patient history and other diagnostic information is necessary to determine patient infection status. The expected result is Negative.  Fact Sheet for Patients: EntrepreneurPulse.com.au  Fact Sheet for Healthcare Providers: IncredibleEmployment.be  This test is not yet approved or cleared by the [place] FDA and  has been authorized for detection and/or diagnosis of SARS-CoV-2 by FDA under an Emergency Use Authorization (EUA).  This EUA will remain in effect (meaning this test can be  used) for the duration of  the COVID-19 declaration under Section 564(b)(1) of the Act, 21 U.S.C. section 360bbb-3(b)(1), unless the authorization is terminated or revoked sooner.     Influenza A by PCR NEGATIVE NEGATIVE Final   Influenza B by PCR NEGATIVE NEGATIVE Final    Comment: (NOTE) The Xpert Xpress SARS-CoV-2/FLU/RSV plus assay is intended as an aid in the diagnosis of influenza from Nasopharyngeal swab specimens and should not be used as a sole basis for treatment. Nasal washings and aspirates are unacceptable for Xpert Xpress SARS-CoV-2/FLU/RSV testing.  Fact Sheet for Patients: EntrepreneurPulse.com.au  Fact Sheet for Healthcare Providers: IncredibleEmployment.be  This test is not yet approved or cleared by the [place] FDA and has been authorized for detection and/or diagnosis of SARS-CoV-2 by FDA under an Emergency Use Authorization (EUA). This EUA  will remain in effect (meaning this test can be used) for the duration of the COVID-19 declaration under Section 564(b)(1) of the Act, 21 U.S.C. section 360bbb-3(b)(1), unless the authorization is terminated or revoked.  Performed at [facility], [address] [place], [place] [address]   Culture, blood (routine x 2)     Status: None   Collection Time: 07/18/20  1:00 PM   Specimen: BLOOD  Result Value Ref Range Status   Specimen Description BLOOD LEFT ANTECUBITAL  Final   Special Requests   Final    BOTTLES DRAWN AEROBIC AND ANAEROBIC Blood Culture results may not be optimal due to an inadequate volume of blood received in culture bottles   Culture   Final    NO GROWTH 5 DAYS Performed at [facility], [address], [place], [place] [address]    Report Status 07/23/2020 FINAL  Final         Radiology Studies: No results found.      Scheduled Meds: . amiodarone  200 mg Oral BID  . chlordiazePOXIDE  25 mg Oral TID  . enoxaparin (LOVENOX) injection  40 mg Subcutaneous Q24H  . methylPREDNISolone (SOLU-MEDROL) injection  20 mg Intravenous Daily  . potassium chloride  40 mEq Oral Once  . thiamine  100 mg Intravenous Daily   Continuous Infusions: . ampicillin-sulbactam (UNASYN) IV 3 g (07/26/20 4944)  . folic acid (FOLVITE) IVPB Stopped (07/25/20 1255)     LOS: 8 days    Time spent: 33 mins    Wyvonnia Dusky, MD [facility] Pager [phone]  If 7PM-7AM, please contact night-coverage 07/26/2020, 12:48 PM

## 2020-07-26 NOTE — Progress Notes (Addendum)
During report noted that HR in the 130's A.fib.  Per night shift RN HR was elevated during the night on the monitor , but was WNL on the Dinamap.  Per CCMD patient converted to A.fib at 0230.   This Clinical research associate checked VS upon assessment.  MEWS 4. BP 88/87, MAP 83. HR 130's -140's. Patient alert to self only, follow commands.  No signs of pain nor acute distress.  Called Rapid response and Dr.Williams notified.  MD ordered Metoprolol IV.  Metoprolol given.  Per MD ok to give Metoprolol due to MAP 83.   HR in the 130's post metporolol IV, BP 90/56.  Patient asleep, easily aroused.  No acute distress.  MD ordered EKG and amiodarone.    Patient converted back to NSR with occasional PVC's. HR 66.

## 2020-07-27 DIAGNOSIS — U071 COVID-19: Secondary | ICD-10-CM | POA: Diagnosis not present

## 2020-07-27 DIAGNOSIS — R1310 Dysphagia, unspecified: Secondary | ICD-10-CM | POA: Diagnosis not present

## 2020-07-27 DIAGNOSIS — J1282 Pneumonia due to coronavirus disease 2019: Secondary | ICD-10-CM | POA: Diagnosis not present

## 2020-07-27 DIAGNOSIS — F10230 Alcohol dependence with withdrawal, uncomplicated: Secondary | ICD-10-CM | POA: Diagnosis not present

## 2020-07-27 LAB — BASIC METABOLIC PANEL
Anion gap: 9 (ref 5–15)
BUN: 11 mg/dL (ref 8–23)
CO2: 30 mmol/L (ref 22–32)
Calcium: 8.3 mg/dL — ABNORMAL LOW (ref 8.9–10.3)
Chloride: 107 mmol/L (ref 98–111)
Creatinine, Ser: 0.4 mg/dL — ABNORMAL LOW (ref 0.61–1.24)
GFR, Estimated: >60 mL/min (ref >60)
Glucose, Bld: 88 mg/dL (ref 70–99)
Potassium: 3.4 mmol/L — ABNORMAL LOW (ref 3.5–5.1)
Sodium: 146 mmol/L — ABNORMAL HIGH (ref 135–145)

## 2020-07-27 LAB — CBC
HCT: 38.3 % — ABNORMAL LOW (ref 39.0–52.0)
Hemoglobin: 12.6 g/dL — ABNORMAL LOW (ref 13.0–17.0)
MCH: 34.4 pg — ABNORMAL HIGH (ref 26.0–34.0)
MCHC: 32.9 g/dL (ref 30.0–36.0)
MCV: 104.6 fL — ABNORMAL HIGH (ref 80.0–100.0)
Platelets: 129 10*3/uL — ABNORMAL LOW (ref 150–400)
RBC: 3.66 MIL/uL — ABNORMAL LOW (ref 4.22–5.81)
RDW: 12.6 % (ref 11.5–15.5)
WBC: 4.6 10*3/uL (ref 4.0–10.5)
nRBC: 0 % (ref 0.0–0.2)

## 2020-07-27 LAB — FERRITIN: Ferritin: 797 ng/mL — ABNORMAL HIGH (ref 24–336)

## 2020-07-27 LAB — C-REACTIVE PROTEIN: CRP: 1.2 mg/dL — ABNORMAL HIGH (ref <1.0)

## 2020-07-27 MED ORDER — FOLIC ACID 1 MG PO TABS
1.0000 mg | ORAL_TABLET | Freq: Every day | ORAL | Status: DC
  Administered 2020-07-27 – 2020-07-29 (×3): 1 mg via ORAL
  Filled 2020-07-27 (×3): qty 1

## 2020-07-27 MED ORDER — POTASSIUM CHLORIDE CRYS ER 20 MEQ PO TBCR
40.0000 meq | EXTENDED_RELEASE_TABLET | Freq: Once | ORAL | Status: AC
  Administered 2020-07-27: 40 meq via ORAL
  Filled 2020-07-27: qty 2

## 2020-07-27 MED ORDER — THIAMINE HCL 100 MG PO TABS
100.0000 mg | ORAL_TABLET | Freq: Every day | ORAL | Status: DC
  Administered 2020-07-28 – 2020-07-29 (×2): 100 mg via ORAL
  Filled 2020-07-27 (×2): qty 1

## 2020-07-27 MED ORDER — LORAZEPAM 2 MG/ML IJ SOLN
2.0000 mg | Freq: Once | INTRAMUSCULAR | Status: DC
  Filled 2020-07-27: qty 1

## 2020-07-27 MED ORDER — AMIODARONE HCL 200 MG PO TABS
400.0000 mg | ORAL_TABLET | Freq: Two times a day (BID) | ORAL | Status: DC
  Administered 2020-07-27 – 2020-07-28 (×3): 400 mg via ORAL
  Filled 2020-07-27 (×3): qty 2

## 2020-07-27 NOTE — Consult Note (Signed)
Cardiology Consultation Note    Patient ID: William [name], MRN: [id number], DOB/AGE: [id number] 65 y.o. Admit date: 08/11/2020   Date of Consult: 07/27/2020 Primary Physician: [facility] Primary Cardiologist: none  Chief Complaint: rapid heart rate  Reason for Consultation: afib with rvr Requesting MD: Dr. Marijean Niemann  HPI: William [name] is a 65 y.o. male with history of Warnicke's encephalopathy, hypertension, alcohol and tobacco abuse who was admitted after sustaining an unwitnessed fall at his home and was unable to stand up.  This admission was done on 2020-08-11.  Per report, patient drinks about 18 beers a day.  EKG on admission revealed sinus tachycardia versus SVT.  EKG on July 24, 2020 revealed sinus rhythm.  Now is developed atrial fibrillation with rapid ventricular response.  Electrolytes currently show potassium 3.4.  His magnesium was 2.2.  He was started on amiodarone and is currently on 200 mg twice daily.  Past Medical History:  Diagnosis Date  . Alcohol abuse   . Tobacco abuse       Surgical History: History reviewed. No pertinent surgical history.   Home Meds: Prior to Admission medications   Not on File    Inpatient Medications:  . amiodarone  400 mg Oral BID  . chlordiazePOXIDE  25 mg Oral TID  . enoxaparin (LOVENOX) injection  40 mg Subcutaneous Q24H  . folic acid  1 mg Oral Daily  . LORazepam  2 mg Intravenous Once  . methylPREDNISolone (SOLU-MEDROL) injection  20 mg Intravenous Daily  . [START ON 07/28/2020] thiamine  100 mg Oral Daily   . sodium chloride Stopped (07/27/20 0617)    Allergies: No Known Allergies  Social History   Socioeconomic History  . Marital status: Single    Spouse name: Not on file  . Number of children: Not on file  . Years of education: Not on file  . Highest education level: Not on file  Occupational History  . Not on file  Tobacco Use  . Smoking status: Current Every Day Smoker    Packs/day: 2.00     Types: Cigarettes  . Smokeless tobacco: Never Used  Substance and Sexual Activity  . Alcohol use: Yes    Alcohol/week: 18.0 standard drinks    Types: 18 Cans of beer per week    Comment: everyday  . Drug use: Not on file  . Sexual activity: Not on file  Other Topics Concern  . Not on file  Social History Narrative  . Not on file   Social Determinants of Health   Financial Resource Strain: Not on file  Food Insecurity: Not on file  Transportation Needs: Not on file  Physical Activity: Not on file  Stress: Not on file  Social Connections: Not on file  Intimate Partner Violence: Not on file     Family History  Problem Relation Age of Onset  . CAD Father      Review of Systems: A 12-system review of systems was performed and is negative except as noted in the HPI.  Labs: No results for input(s): CKTOTAL, CKMB, TROPONINI in the last 72 hours. Lab Results  Component Value Date   WBC 4.6 07/27/2020   HGB 12.6 (L) 07/27/2020   HCT 38.3 (L) 07/27/2020   MCV 104.6 (H) 07/27/2020   PLT 129 (L) 07/27/2020    Recent Labs  Lab 07/23/20 0430 07/24/20 0612 07/27/20 0621  NA 144   < > 146*  K 3.3*   < > 3.4*  CL 106   < > 107  CO2 29   < > 30  BUN 10   < > 11  CREATININE 0.47*   < > 0.40*  CALCIUM 7.8*   < > 8.3*  PROT 5.2*  --   --   BILITOT 1.5*  --   --   ALKPHOS 68  --   --   ALT 15  --   --   AST 25  --   --   GLUCOSE 80   < > 88   < > = values in this interval not displayed.   No results found for: CHOL, HDL, LDLCALC, TRIG Lab Results  Component Value Date   DDIMER 2.59 (H) 07/23/2020    Radiology/Studies:  CT Head Wo Contrast  Result Date: 08/04/2020 CLINICAL DATA:  Larey Seat yesterday, found down after 12 hours, alcohol abuse EXAM: CT HEAD WITHOUT CONTRAST TECHNIQUE: Contiguous axial images were obtained from the base of the skull through the vertex without intravenous contrast. COMPARISON:  12/05/2019, 12/12/2019 FINDINGS: Brain: Confluent hypodensities  within the periventricular white matter are compatible with chronic small vessel ischemic changes, stable since previous exam. Focal hypodensity within the left thalamus is new since previous study, with an appearance suggesting a chronic lacunar infarct. No evidence of acute infarct or hemorrhage. Stable diffuse cerebral atrophy. The lateral ventricles and midline structures are unremarkable. No acute extra-axial fluid collections. No mass effect. Vascular: No hyperdense vessel or unexpected calcification. Skull: Normal. Negative for fracture or focal lesion. Sinuses/Orbits: Kozel thickening within the bilateral ethmoid air cells. Remaining sinuses are clear. Other: None. IMPRESSION: 1. No acute intracranial process. 2. Stable chronic small vessel ischemic changes and cerebral atrophy. 3. Chronic appearing left thalamic lacunar infarct, new since previous study. Electronically Signed   By: Sharlet Salina M.D.   On: 08-04-20 21:59   CT ANGIO CHEST PE W OR WO CONTRAST  Result Date: 07/19/2020 CLINICAL DATA:  65 year old male with positive D-dimer. Concern for pulmonary embolism. EXAM: CT ANGIOGRAPHY CHEST WITH CONTRAST TECHNIQUE: Multidetector CT imaging of the chest was performed using the standard protocol during bolus administration of intravenous contrast. Multiplanar CT image reconstructions and MIPs were obtained to evaluate the vascular anatomy. CONTRAST:  60mL OMNIPAQUE IOHEXOL 350 MG/ML SOLN COMPARISON:  Chest radiograph dated 08-04-20 FINDINGS: Cardiovascular: There is no cardiomegaly or pericardial effusion. The ascending thoracic aorta is dilated measuring up to 4.7 cm in diameter (coronal 32/13). Evaluation of the pulmonary arteries is limited due to respiratory motion artifact. No pulmonary artery embolus identified. Mediastinum/Nodes: No hilar or mediastinal adenopathy. The esophagus is grossly unremarkable. No mediastinal fluid collection. Lungs/Pleura: Small bilateral pleural effusions.  Bilateral lower lobe subsegmental and subpleural consolidation may represent atelectasis or pneumonia. There are however clusters of nodular density in the lower lobes bilaterally concerning for pneumonia. Aspiration is not excluded. Clinical correlation is recommended. There is background of centrilobular emphysema. There is no pneumothorax. Mucus secretions noted in the trachea and bilateral mainstem bronchus. There is mucous impaction in the left lower lobe and to a lesser degree right lower lobe bronchi. Upper Abdomen: No acute abnormality. Musculoskeletal: Degenerative changes of the spine. No acute osseous pathology. Review of the MIP images confirms the above findings. IMPRESSION: 1. No CT evidence of pulmonary embolism. 2. Small bilateral pleural effusions. 3. Bilateral lower lobe bronchi mucous impaction with bilateral lower lobes clusters of nodularity concerning for pneumonia versus aspiration. 4. aortic Atherosclerosis (ICD10-I70.0) and Emphysema (ICD10-J43.9). Electronically Signed   By: Elgie Collard  M.D.   On: 07/19/2020 16:10   DG Chest Portable 1 View  Result Date: 08/01/2020 CLINICAL DATA:  Cough. EXAM: PORTABLE CHEST 1 VIEW COMPARISON:  12/08/2019 prior radiographs FINDINGS: The cardiomediastinal silhouette is unchanged. Chronic interstitial opacities are noted. Slightly increased RIGHT basilar opacity may represent atelectasis or airspace disease. No pleural effusion or pneumothorax identified. No acute bony abnormalities are noted. IMPRESSION: 1. Slightly increased RIGHT basilar opacity -question atelectasis or airspace disease. 2. Chronic interstitial opacities. Electronically Signed   By: Harmon Pier M.D.   On: August 01, 2020 18:03    Wt Readings from Last 3 Encounters:  07/18/20 80 kg  01/21/20 81.6 kg  12/06/19 86.6 kg    EKG: Atrial fibrillation  Physical Exam:n Blood pressure 108/86, pulse 83, temperature 97.8 F (36.6 C), resp. rate 18, height 6\' 1"  (1.854 m), weight 80 kg,  SpO2 98 %. Body mass index is 23.27 kg/m. General: Somewhat unkept male Head: Normocephalic, atraumatic, sclera non-icteric, no xanthomas, nares are without discharge.  Neck: Negative for carotid bruits. JVD not elevated. Lungs: Decreased breath sounds bilaterally Heart: Irregular irregular rhythm Abdomen: Soft, non-tender, non-distended with normoactive bowel sounds. No hepatomegaly. No rebound/guarding. No obvious abdominal masses. Msk:  Strength and tone appear normal for age. Extremities: No clubbing or cyanosis. No edema.  Distal pedal pulses are 2+ and equal bilaterally. Neuro: Alert but somewhat difficult to answer questions. Psych:  Responds to questions appropriately with a normal affect.     Assessment and Plan  65 year old male with history of ethanol use admitted with history of a fall.  Has a history of of drinking approximately 18 beers a day.  Was then probable sinus tachycardia on admission.  Subsequent EKG showed sinus rhythm now in atrial fibrillation with somewhat rapid ventricular response.  Currently on amiodarone at 200 twice daily.  Not an ideal candidate for chronic anticoagulation due to ethanol abuse.  This will need to be addressed prior to discharge.  We will increase amiodarone to 400 mg twice daily and follow rate response.  We will check his echocardiogram to evaluate ejection fraction to guide further medical therapy.  Need to evaluate for evidence of ethanol induced cardiomyopathy.  Continue to treat for alcohol withdrawal.  Continue with metoprolol 25 mg twice daily.  Signed, 77 MD 07/27/2020, 2:29 PM Pager: [phone]

## 2020-07-27 NOTE — Progress Notes (Signed)
   07/26/20 1931  Assess: MEWS Score  Temp (!) 96.3 F (35.7 C)  BP 94/71  Pulse Rate 66  Resp 20  SpO2 99 %  Assess: MEWS Score  MEWS Temp 1  MEWS Systolic 1  MEWS Pulse 0  MEWS RR 0  MEWS LOC 0  MEWS Score 2  MEWS Score Color Yellow  Assess: if the MEWS score is Yellow or Red  Were vital signs taken at a resting state? Yes  Focused Assessment No change from prior assessment  Early Detection of Sepsis Score *See Row Information* Low  MEWS guidelines implemented *See Row Information* No, vital signs rechecked  Take Vital Signs  Increase Vital Sign Frequency  Yellow: Q 2hr X 2 then Q 4hr X 2, if remains yellow, continue Q 4hrs  Escalate  MEWS: Escalate Yellow: discuss with charge nurse/RN and consider discussing with provider and RRT  Notify: Charge Nurse/RN  Name of Charge Nurse/RN Notified self  Date Charge Nurse/RN Notified 07/26/20  Time Charge Nurse/RN Notified 1931

## 2020-07-27 NOTE — Plan of Care (Signed)
Pt refused most care last night.  Refused to take important medication - amiodarone for new onset Afib and librium.  He was very agitated and pulling off tele leads, purewick and IV.  Had to put safety mitts on him. Also tried to get out of bed and had to give 1 mg ativan. Notified oncall NP who put in a 1x order of 2mg  ativan.  But pt improved w/ 1 mg.  2mg  order not given.

## 2020-07-27 NOTE — Progress Notes (Addendum)
PROGRESS NOTE    William [name]  KVQ:[id number] DOB: 1956/04/09 DOA: 07/27/2020 PCP: [facility]    Assessment & Plan:   Principal Problem:   Alcohol withdrawal ([place]) Active Problems:   Tobacco abuse   Thrombocytopenia ([place])   Leukocytopenia   CAP (community acquired pneumonia)   Pneumonia due to COVID-19 virus   Severe sepsis: met criteria w/ tachycardia, leukopenia, aspiration pneumonia & COVID-19 pneumonia. Resolved  Pneumonia: secondary to COVID19 & aspiration. Completed abx course. Continue on IV steroid taper. Completed remdesivir course. Continue on bronchodilators & encourage incentive spirometry   Acute metabolic encephalopathy: w/ hx of alcohol abuse/WE. CT head is neg. Re-orient prn   Dysphagia: continue on dysphagia III diet as per pt has assumed the risk of recurrent aspiration pneumonia.   A. Fib: new & w/ intermittent RVR. Continue on increased dose of amiodarone as per cardio.  No anticoagulation secondary to high fall risk. Cardio consulted and recs apprec.  Alcohol abuse: w/ history of Warnicke's encephalopathy. Continue on librium. IV ativan prn   Hypokalemia: KCL repleted. Will continue to monitor   Hyponatremia: resolved  Tobacco abuse: smoking cessation counseling   Leukopenia: resolved   Thrombocytopenia: likely secondary to alcohol abuse    DVT prophylaxis: lovenox  Code Status: DNR Family Communication: Disposition Plan: likely d/c to SNF  Status is: Inpatient  Remains inpatient appropriate because:Altered mental status, IV treatments appropriate due to intensity of illness or inability to take PO and Inpatient level of care appropriate due to severity of illness, waiting on SNF placement still    Dispo: The patient is from: Home              Anticipated d/c is to: SNF              Anticipated d/c date is: 2 days              Patient currently is not medically stable for d/c       Consultants:   Palliative  care  Cardio    Procedures:    Antimicrobials:    Subjective: Pt answers question inappropriately intermittently   Objective: Vitals:   07/26/20 1525 07/26/20 1931 07/26/20 2352 07/27/20 0453  BP: (!) 97/53 94/71 103/73 114/87  Pulse: 63 66 72 78  Resp: 20 20 18 18   Temp: 98.2 F (36.8 C) (!) 96.3 F (35.7 C) (!) 96.9 F (36.1 C) (!) 97 F (36.1 C)  TempSrc: Oral Axillary Axillary   SpO2: 100% 99% 94% 92%  Weight:      Height:        Intake/Output Summary (Last 24 hours) at 07/27/2020 0742 Last data filed at 07/26/2020 1614 Gross per 24 hour  Intake 484.72 ml  Output 400 ml  Net 84.72 ml   Filed Weights   07/18/20 1120  Weight: 80 kg    Examination:  General exam: Appears older than stated age. Appears confused Respiratory system: decreased breath sounds b/l  Cardiovascular system: irregularly irregular. No rubs or gallops Gastrointestinal system: Abd is soft, NT, ND & hypoactive bowel sounds  Central nervous system: Moves all 4 extremities  Psychiatry: Judgement and insight appear abnormal. Flat mood and affect     Data Reviewed: I have personally reviewed following labs and imaging studies  CBC: Recent Labs  Lab 07/21/20 0549 07/22/20 0528 07/23/20 0430 07/24/20 0612 07/25/20 0344 07/26/20 0659 07/26/20 1844 07/27/20 0621  WBC 4.0 4.2 4.3 5.7 3.2* 4.7  --  4.6  NEUTROABS 3.4 3.1  2.9  --   --   --   --   --   HGB 13.6 12.9* 13.5 12.9* 12.3* 12.3* 11.5* 12.6*  HCT 39.8 37.7* 39.4 38.3* 37.6* 36.4* 34.8* 38.3*  MCV 103.1* 102.4* 102.9* 103.5* 105.0* 103.4*  --  104.6*  PLT 92* 90* 87* 91* 111* 110*  --  284*   Basic Metabolic Panel: Recent Labs  Lab 07/23/20 0430 07/24/20 0612 07/25/20 0344 07/26/20 0659 07/27/20 0621  NA 144 142 143 145 146*  K 3.3* 3.6 3.6 3.4* 3.4*  CL 106 105 106 108 107  CO2 29 27 30 29 30   GLUCOSE 80 99 104* 97 88  BUN 10 13 13 13 11   CREATININE 0.47* 0.41* 0.45* 0.39* 0.40*  CALCIUM 7.8* 8.2* 8.0* 7.9* 8.3*   MG  --   --   --  2.2  --    GFR: Estimated Creatinine Clearance: 105.4 mL/min (A) (by C-G formula based on SCr of 0.4 mg/dL (L)). Liver Function Tests: Recent Labs  Lab 07/21/20 0549 07/22/20 0528 07/23/20 0430  AST 29 28 25   ALT 19 17 15   ALKPHOS 59 60 68  BILITOT 1.3* 1.2 1.5*  PROT 5.7* 5.3* 5.2*  ALBUMIN 2.3* 2.3* 2.2*   No results for input(s): LIPASE, AMYLASE in the last 168 hours. No results for input(s): AMMONIA in the last 168 hours. Coagulation Profile: No results for input(s): INR, PROTIME in the last 168 hours. Cardiac Enzymes: No results for input(s): CKTOTAL, CKMB, CKMBINDEX, TROPONINI in the last 168 hours. BNP (last 3 results) No results for input(s): PROBNP in the last 8760 hours. HbA1C: No results for input(s): HGBA1C in the last 72 hours. CBG: No results for input(s): GLUCAP in the last 168 hours. Lipid Profile: No results for input(s): CHOL, HDL, LDLCALC, TRIG, CHOLHDL, LDLDIRECT in the last 72 hours. Thyroid Function Tests: No results for input(s): TSH, T4TOTAL, FREET4, T3FREE, THYROIDAB in the last 72 hours. Anemia Panel: Recent Labs    07/26/20 0659 07/27/20 0621  FERRITIN 699* 797*   Sepsis Labs: No results for input(s): PROCALCITON, LATICACIDVEN in the last 168 hours.  Recent Results (from the past 240 hour(s))  Culture, blood (routine x 2)     Status: None   Collection Time: 07/18/20  8:13 AM   Specimen: BLOOD RIGHT HAND  Result Value Ref Range Status   Specimen Description BLOOD RIGHT HAND  Final   Special Requests   Final    BOTTLES DRAWN AEROBIC AND ANAEROBIC Blood Culture adequate volume   Culture   Final    NO GROWTH 5 DAYS Performed at [facility], [address] [place], [place] [address]    Report Status 07/23/2020 FINAL  Final  Resp Panel by RT-PCR (Flu A&B, Covid) Nasopharyngeal Swab     Status: Abnormal   Collection Time: 07/18/20  9:06 AM   Specimen: Nasopharyngeal Swab; Nasopharyngeal(NP) swabs in vial  transport medium  Result Value Ref Range Status   SARS Coronavirus 2 by RT PCR POSITIVE (A) NEGATIVE Final    Comment: RESULT CALLED TO, READ BACK BY AND VERIFIED WITH: REINA GALJOUR AT 0102 07/18/20.PMF (NOTE) SARS-CoV-2 target nucleic acids are DETECTED.  The SARS-CoV-2 RNA is generally detectable in upper respiratory specimens during the acute phase of infection. Positive results are indicative of the presence of the identified virus, but do not rule out bacterial infection or co-infection with other pathogens not detected by the test. Clinical correlation with patient history and other diagnostic information is  necessary to determine patient infection status. The expected result is Negative.  Fact Sheet for Patients: EntrepreneurPulse.com.au  Fact Sheet for Healthcare Providers: IncredibleEmployment.be  This test is not yet approved or cleared by the [place] FDA and  has been authorized for detection and/or diagnosis of SARS-CoV-2 by FDA under an Emergency Use Authorization (EUA).  This EUA will remain in effect (meaning this test can be  used) for the duration of  the COVID-19 declaration under Section 564(b)(1) of the Act, 21 U.S.C. section 360bbb-3(b)(1), unless the authorization is terminated or revoked sooner.     Influenza A by PCR NEGATIVE NEGATIVE Final   Influenza B by PCR NEGATIVE NEGATIVE Final    Comment: (NOTE) The Xpert Xpress SARS-CoV-2/FLU/RSV plus assay is intended as an aid in the diagnosis of influenza from Nasopharyngeal swab specimens and should not be used as a sole basis for treatment. Nasal washings and aspirates are unacceptable for Xpert Xpress SARS-CoV-2/FLU/RSV testing.  Fact Sheet for Patients: EntrepreneurPulse.com.au  Fact Sheet for Healthcare Providers: IncredibleEmployment.be  This test is not yet approved or cleared by the [place] FDA and has been  authorized for detection and/or diagnosis of SARS-CoV-2 by FDA under an Emergency Use Authorization (EUA). This EUA will remain in effect (meaning this test can be used) for the duration of the COVID-19 declaration under Section 564(b)(1) of the Act, 21 U.S.C. section 360bbb-3(b)(1), unless the authorization is terminated or revoked.  Performed at [facility], [address] [place], [place] [address]   Culture, blood (routine x 2)     Status: None   Collection Time: 07/18/20  1:00 PM   Specimen: BLOOD  Result Value Ref Range Status   Specimen Description BLOOD LEFT ANTECUBITAL  Final   Special Requests   Final    BOTTLES DRAWN AEROBIC AND ANAEROBIC Blood Culture results may not be optimal due to an inadequate volume of blood received in culture bottles   Culture   Final    NO GROWTH 5 DAYS Performed at [facility], [address], [place], [place] [address]    Report Status 07/23/2020 FINAL  Final         Radiology Studies: No results found.      Scheduled Meds: . amiodarone  200 mg Oral BID  . chlordiazePOXIDE  25 mg Oral TID  . enoxaparin (LOVENOX) injection  40 mg Subcutaneous Q24H  . LORazepam  2 mg Intravenous Once  . methylPREDNISolone (SOLU-MEDROL) injection  20 mg Intravenous Daily  . thiamine  100 mg Intravenous Daily   Continuous Infusions: . sodium chloride 250 mL (07/27/20 0612)  . ampicillin-sulbactam (UNASYN) IV 3 g (07/27/20 0617)  . folic acid (FOLVITE) IVPB Stopped (07/26/20 1513)     LOS: 9 days    Time spent: 25 mins    Wyvonnia Dusky, MD [facility] Pager [phone]  If 7PM-7AM, please contact night-coverage 07/27/2020, 7:42 AM

## 2020-07-28 ENCOUNTER — Inpatient Hospital Stay
Admit: 2020-07-28 | Discharge: 2020-07-28 | Disposition: A | Discharge status: Home / Self Care | Payer: No Typology Code available for payment source | Attending: Cardiology | Admitting: Cardiology

## 2020-07-28 DIAGNOSIS — R1310 Dysphagia, unspecified: Secondary | ICD-10-CM | POA: Diagnosis not present

## 2020-07-28 DIAGNOSIS — J1282 Pneumonia due to coronavirus disease 2019: Secondary | ICD-10-CM | POA: Diagnosis not present

## 2020-07-28 DIAGNOSIS — U071 COVID-19: Secondary | ICD-10-CM | POA: Diagnosis not present

## 2020-07-28 DIAGNOSIS — F10230 Alcohol dependence with withdrawal, uncomplicated: Secondary | ICD-10-CM | POA: Diagnosis not present

## 2020-07-28 LAB — CBC
HCT: 36.6 % — ABNORMAL LOW (ref 39.0–52.0)
Hemoglobin: 12.4 g/dL — ABNORMAL LOW (ref 13.0–17.0)
MCH: 34.9 pg — ABNORMAL HIGH (ref 26.0–34.0)
MCHC: 33.9 g/dL (ref 30.0–36.0)
MCV: 103.1 fL — ABNORMAL HIGH (ref 80.0–100.0)
Platelets: 126 10*3/uL — ABNORMAL LOW (ref 150–400)
RBC: 3.55 MIL/uL — ABNORMAL LOW (ref 4.22–5.81)
RDW: 12.9 % (ref 11.5–15.5)
WBC: 3.5 10*3/uL — ABNORMAL LOW (ref 4.0–10.5)
nRBC: 0 % (ref 0.0–0.2)

## 2020-07-28 LAB — BASIC METABOLIC PANEL
Anion gap: 7 (ref 5–15)
BUN: 9 mg/dL (ref 8–23)
CO2: 30 mmol/L (ref 22–32)
Calcium: 8.2 mg/dL — ABNORMAL LOW (ref 8.9–10.3)
Chloride: 106 mmol/L (ref 98–111)
Creatinine, Ser: 0.44 mg/dL — ABNORMAL LOW (ref 0.61–1.24)
GFR, Estimated: >60 mL/min (ref >60)
Glucose, Bld: 88 mg/dL (ref 70–99)
Potassium: 3.5 mmol/L (ref 3.5–5.1)
Sodium: 143 mmol/L (ref 135–145)

## 2020-07-28 LAB — C-REACTIVE PROTEIN: CRP: 1.1 mg/dL — ABNORMAL HIGH (ref <1.0)

## 2020-07-28 LAB — FERRITIN: Ferritin: 750 ng/mL — ABNORMAL HIGH (ref 24–336)

## 2020-07-28 MED ORDER — AMIODARONE HCL 200 MG PO TABS
200.0000 mg | ORAL_TABLET | Freq: Two times a day (BID) | ORAL | Status: DC
  Administered 2020-07-28 – 2020-08-02 (×7): 200 mg via ORAL
  Filled 2020-07-28 (×9): qty 1

## 2020-07-28 NOTE — Progress Notes (Signed)
Physical Therapy Treatment Patient Details Name: William [name] MRN: [id number] DOB: 02/18/1956 Today's Date: 07/28/2020    History of Present Illness William [name] is a 65 y.o. male with medical history significant of Wernicke encephalopathy, hypertension, alcohol and tobacco abuse who is brought to the emergency department via EMS 07-Aug-2020 after sustaining an unwitnessed fall at home last night with inability to stand up afterwards and remained on the floor for about 12 hours.    PT Comments    Pt resting in bed upon PT arrival (with B mitts donned--per nurse pt pulled out IV earlier today and new IV was placed); peripheral IV noted to be laying in pt's bed (nurse notified and came to assess).  Mod to max assist semi-supine to sitting edge of bed; pt with occasional posterior lean or slumped forward posture in sitting requiring cueing for upright sitting; unable to stand pt up to walker with max cueing and 1 assist; and min assist to lay back down in bed end of session.  Will continue to focus on strengthening and progressive functional mobility during hospitalization.   Follow Up Recommendations  SNF     Equipment Recommendations  Rolling walker with 5" wheels;3in1 (PT)    Recommendations for Other Services       Precautions / Restrictions Precautions Precautions: Fall Precaution Comments: wounds, bleeding; B mitts Restrictions Weight Bearing Restrictions: No    Mobility  Bed Mobility Overal bed mobility: Needs Assistance Bed Mobility: Supine to Sit;Sit to Supine     Supine to sit: Mod assist;Max assist;HOB elevated (assist for trunk; vc's for LE's) Sit to supine: Min assist;HOB elevated (assist for LE's)   General bed mobility comments: vc's for technique; increased effort for pt to perform on own as much as possible  Transfers Overall transfer level: Needs assistance Equipment used: Rolling walker (2 wheeled) Transfers: Sit to/from Stand Sit to Stand: Total assist          General transfer comment: vc's and tactile cues for UE placement; unable to stand with max cueing and 1 assist  Ambulation/Gait             General Gait Details: unable to stand to attempt   Stairs             Wheelchair Mobility    Modified Rankin (Stroke Patients Only)       Balance Overall balance assessment: Needs assistance;History of Falls Sitting-balance support: Single extremity supported;Feet supported Sitting balance-Leahy Scale: Poor Sitting balance - Comments: pt occasionally with posterior lean or slumping forward requiring cueing for upright posture (pt able to hold upright posture sitting for short periods of time)                                    Cognition Arousal/Alertness: Awake/alert Behavior During Therapy: Impulsive;Flat affect Overall Cognitive Status: No family/caregiver present to determine baseline cognitive functioning                                 General Comments: Oriented to at least person      Exercises      General Comments   Nursing cleared pt for participation in physical therapy.  Pt agreeable to PT session.      Pertinent Vitals/Pain Pain Assessment: Faces Faces Pain Scale: No hurt Pain Intervention(s): Limited activity within patient's tolerance;Monitored during session;Repositioned  HR  85-107 bpm and O2 sats 94% or greater on room air during sessions activities.    Home Living                      Prior Function            PT Goals (current goals can now be found in the care plan section) Acute Rehab PT Goals PT Goal Formulation: With patient Time For Goal Achievement: 08/10/2020 Potential to Achieve Goals: Fair Progress towards PT goals: Progressing toward goals    Frequency    Min 2X/week      PT Plan Current plan remains appropriate    Co-evaluation              AM-PAC PT "6 Clicks" Mobility   Outcome Measure  Help needed turning from your  back to your side while in a flat bed without using bedrails?: A Lot Help needed moving from lying on your back to sitting on the side of a flat bed without using bedrails?: A Lot Help needed moving to and from a bed to a chair (including a wheelchair)?: A Lot Help needed standing up from a chair using your arms (e.g., wheelchair or bedside chair)?: Total Help needed to walk in hospital room?: Total Help needed climbing 3-5 steps with a railing? : Total 6 Click Score: 9    End of Session Equipment Utilized During Treatment: Gait belt Activity Tolerance: Patient limited by fatigue Patient left: in bed;with call bell/phone within reach;with bed alarm set;Other (comment) (bed in lowest position) Nurse Communication: Mobility status (pt's IV laying on bed upon PT arrival) PT Visit Diagnosis: Difficulty in walking, not elsewhere classified (R26.2);Other abnormalities of gait and mobility (R26.89);Muscle weakness (generalized) (M62.81);Repeated falls (R29.6)     Time: 9702-6378 PT Time Calculation (min) (ACUTE ONLY): 30 min  Charges:  $Therapeutic Activity: 23-37 mins                    Hendricks Limes, PT 07/28/20, 2:17 PM

## 2020-07-28 NOTE — Progress Notes (Signed)
Patient Name: William [name] Date of Encounter: 07/28/2020  Hospital Problem List     Principal Problem:   Alcohol withdrawal ([facility]) Active Problems:   Tobacco abuse   Thrombocytopenia ([facility])   Leukocytopenia   CAP (community acquired pneumonia)   Pneumonia due to COVID-19 virus     Patient Profile     65 y.o. male with history of Warnicke's encephalopathy, hypertension, alcohol and tobacco abuse who was admitted after sustaining an unwitnessed fall at his home and was unable to stand up.  This admission was done on July 17, 2020.  Per report, patient drinks about 18 beers a day.  EKG on admission revealed sinus tachycardia versus SVT.  EKG on July 24, 2020 revealed sinus rhythm.  Now is developed atrial fibrillation with rapid ventricular response.  Electrolytes currently show potassium 3.4.  His magnesium was 2.2.  He was started on amiodarone and is currently on 400 mg twice daily.   Subjective    Limited historian.  Appears to have converted to sinus rhythm. Inpatient Medications    . amiodarone  200 mg Oral BID  . chlordiazePOXIDE  25 mg Oral TID  . enoxaparin (LOVENOX) injection  40 mg Subcutaneous Q24H  . folic acid  1 mg Oral Daily  . LORazepam  2 mg Intravenous Once  . thiamine  100 mg Oral Daily    Vital Signs    Vitals:   07/27/20 1658 07/27/20 1946 07/28/20 0344 07/28/20 1039  BP: 115/83 111/81 110/80 115/80  Pulse: 69 75 74 81  Resp: 18 18 18 16   Temp: 97.8 F (36.6 C) (!) 97.5 F (36.4 C) (!) 97.5 F (36.4 C) (!) 97.5 F (36.4 C)  TempSrc:  Axillary Oral Oral  SpO2: 99% 94% 92% 92%  Weight:      Height:       No intake or output data in the 24 hours ending 07/28/20 1406 Filed Weights   07/18/20 1120  Weight: 80 kg    Physical Exam     HEENT: normal.  Neck: Supple, no JVD, carotid bruits, or masses. Cardiac: RRR, no murmurs, rubs, or gallops. No clubbing, cyanosis, edema.  Radials/DP/PT 2+ and equal bilaterally.  Respiratory:  Scattered rhonchi bilaterally GI: Soft, nontender, nondistended, BS + x 4. MS: no deformity or atrophy. Skin: warm and dry, no rash. Neuro:  Strength and sensation are intact.   Labs    CBC Recent Labs    07/27/20 0621 07/28/20 0758  WBC 4.6 3.5*  HGB 12.6* 12.4*  HCT 38.3* 36.6*  MCV 104.6* 103.1*  PLT 129* 126*   Basic Metabolic Panel Recent Labs    07/30/20 0659 07/27/20 0621 07/28/20 0758  NA 145 146* 143  K 3.4* 3.4* 3.5  CL 108 107 106  CO2 29 30 30   GLUCOSE 97 88 88  BUN 13 11 9   CREATININE 0.39* 0.40* 0.44*  CALCIUM 7.9* 8.3* 8.2*  MG 2.2  --   --    Liver Function Tests No results for input(s): AST, ALT, ALKPHOS, BILITOT, PROT, ALBUMIN in the last 72 hours. No results for input(s): LIPASE, AMYLASE in the last 72 hours. Cardiac Enzymes No results for input(s): CKTOTAL, CKMB, CKMBINDEX, TROPONINI in the last 72 hours. BNP No results for input(s): BNP in the last 72 hours. D-Dimer No results for input(s): DDIMER in the last 72 hours. Hemoglobin A1C No results for input(s): HGBA1C in the last 72 hours. Fasting Lipid Panel No results for input(s): CHOL, HDL, LDLCALC, TRIG,  CHOLHDL, LDLDIRECT in the last 72 hours. Thyroid Function Tests No results for input(s): TSH, T4TOTAL, T3FREE, THYROIDAB in the last 72 hours.  Invalid input(s): FREET3  Telemetry    Currently sinus rhythm  ECG       Radiology    CT Head Wo Contrast  Result Date: Aug 12, 2020 CLINICAL DATA:  Larey Seat yesterday, found down after 12 hours, alcohol abuse EXAM: CT HEAD WITHOUT CONTRAST TECHNIQUE: Contiguous axial images were obtained from the base of the skull through the vertex without intravenous contrast. COMPARISON:  12/05/2019, 12/12/2019 FINDINGS: Brain: Confluent hypodensities within the periventricular white matter are compatible with chronic small vessel ischemic changes, stable since previous exam. Focal hypodensity within the left thalamus is new since previous study, with an  appearance suggesting a chronic lacunar infarct. No evidence of acute infarct or hemorrhage. Stable diffuse cerebral atrophy. The lateral ventricles and midline structures are unremarkable. No acute extra-axial fluid collections. No mass effect. Vascular: No hyperdense vessel or unexpected calcification. Skull: Normal. Negative for fracture or focal lesion. Sinuses/Orbits: Kozel thickening within the bilateral ethmoid air cells. Remaining sinuses are clear. Other: None. IMPRESSION: 1. No acute intracranial process. 2. Stable chronic small vessel ischemic changes and cerebral atrophy. 3. Chronic appearing left thalamic lacunar infarct, new since previous study. Electronically Signed   By: Sharlet Salina M.D.   On: 2020-08-12 21:59   CT ANGIO CHEST PE W OR WO CONTRAST  Result Date: 07/19/2020 CLINICAL DATA:  65 year old male with positive D-dimer. Concern for pulmonary embolism. EXAM: CT ANGIOGRAPHY CHEST WITH CONTRAST TECHNIQUE: Multidetector CT imaging of the chest was performed using the standard protocol during bolus administration of intravenous contrast. Multiplanar CT image reconstructions and MIPs were obtained to evaluate the vascular anatomy. CONTRAST:  98mL OMNIPAQUE IOHEXOL 350 MG/ML SOLN COMPARISON:  Chest radiograph dated 08/12/20 FINDINGS: Cardiovascular: There is no cardiomegaly or pericardial effusion. The ascending thoracic aorta is dilated measuring up to 4.7 cm in diameter (coronal 32/13). Evaluation of the pulmonary arteries is limited due to respiratory motion artifact. No pulmonary artery embolus identified. Mediastinum/Nodes: No hilar or mediastinal adenopathy. The esophagus is grossly unremarkable. No mediastinal fluid collection. Lungs/Pleura: Small bilateral pleural effusions. Bilateral lower lobe subsegmental and subpleural consolidation may represent atelectasis or pneumonia. There are however clusters of nodular density in the lower lobes bilaterally concerning for pneumonia.  Aspiration is not excluded. Clinical correlation is recommended. There is background of centrilobular emphysema. There is no pneumothorax. Mucus secretions noted in the trachea and bilateral mainstem bronchus. There is mucous impaction in the left lower lobe and to a lesser degree right lower lobe bronchi. Upper Abdomen: No acute abnormality. Musculoskeletal: Degenerative changes of the spine. No acute osseous pathology. Review of the MIP images confirms the above findings. IMPRESSION: 1. No CT evidence of pulmonary embolism. 2. Small bilateral pleural effusions. 3. Bilateral lower lobe bronchi mucous impaction with bilateral lower lobes clusters of nodularity concerning for pneumonia versus aspiration. 4. aortic Atherosclerosis (ICD10-I70.0) and Emphysema (ICD10-J43.9). Electronically Signed   By: Elgie Collard M.D.   On: 07/19/2020 16:10   DG Chest Portable 1 View  Result Date: 2020/08/12 CLINICAL DATA:  Cough. EXAM: PORTABLE CHEST 1 VIEW COMPARISON:  12/08/2019 prior radiographs FINDINGS: The cardiomediastinal silhouette is unchanged. Chronic interstitial opacities are noted. Slightly increased RIGHT basilar opacity may represent atelectasis or airspace disease. No pleural effusion or pneumothorax identified. No acute bony abnormalities are noted. IMPRESSION: 1. Slightly increased RIGHT basilar opacity -question atelectasis or airspace disease. 2. Chronic interstitial opacities. Electronically Signed  By: Harmon Pier M.D.   On: 08/11/2020 18:03    Assessment & Plan    65 year old male with history of ethanol use admitted with history of a fall.  Has a history of of drinking approximately 18 beers a day.  Was then probable sinus tachycardia on admission.  Subsequent EKG showed sinus rhythm now in atrial fibrillation with somewhat rapid ventricular response.  Currently on amiodarone at 400 twice daily.  Not an ideal candidate for chronic anticoagulation due to ethanol abuse..    1.  Atrial  fibrillation-appears to have converted to sinus rhythm.  We will continue with Toprol all and decrease amiodarone to 200 mg twice daily.  We will review echo when available but most likely will continue with current regimen.  Not a candidate for chronic anticoagulation at present given heavy ethanol use.  We will discuss this as an outpatient if he is compliant and limits alcohol.  2.  Alcohol abuse-we will need to stop.  3.  COVID-continue current therapy.   Signed, Darlin Priestly Illyana Schorsch MD 07/28/2020, 2:06 PM  Pager: [phone]

## 2020-07-28 NOTE — TOC Progression Note (Signed)
Transition of Care [facility]) - Progression Note    Patient Details  Name: William [name] MRN: [id number] Date of Birth: 1955/11/24  [facility] of [facility] CM/SW Contact  Liliana Cline, LCSW Phone Number: 07/28/2020, 2:57 PM  Clinical Narrative:   CSW reached out to [facility], inquired if patient has been approved for SNF by the [place]. Waiting for reply.    Expected Discharge Plan: Skilled Nursing Facility Barriers to Discharge: Continued Medical Work up,Inadequate or no insurance  Expected Discharge Plan and Services Expected Discharge Plan: Skilled Nursing Facility     Post Acute Care Choice: Skilled Nursing Facility Living arrangements for the past 2 months: Mobile Home                                       Social Determinants of Health (SDOH) Interventions    Readmission Risk Interventions No flowsheet data found.

## 2020-07-28 NOTE — Progress Notes (Signed)
PROGRESS NOTE    William [name]  STM:[id number] DOB: 1956/02/29 DOA: 07/20/2020 PCP: [facility]    Assessment & Plan:   Principal Problem:   Alcohol withdrawal ([place]) Active Problems:   Tobacco abuse   Thrombocytopenia ([place])   Leukocytopenia   CAP (community acquired pneumonia)   Pneumonia due to COVID-19 virus   Severe sepsis: met criteria w/ tachycardia, leukopenia, aspiration pneumonia & COVID-19 pneumonia. Resolved  Pneumonia: secondary to COVID19 & aspiration. Completed remdesivir, abx & steroid courses. Continue on bronchodilators and encourage incentive spirometry   Acute metabolic encephalopathy: w/ hx of alcohol abuse/WE. CT head is neg. Re-orient prn   Dysphagia: continue on dysphagia III diet as pt has assumed the risk of recurrent aspiration pneumonia  A. Fib: new & w/ intermittent RVR. Continue on amiodarone as per cardio. No anticoagulation secondary to high fall risk. Cardio following and recs apprec   Alcohol abuse: w/ history of Warnicke's encephalopathy. Continue on librium. Ativan prn   Hypokalemia: WNL today   Hyponatremia: resolved  Tobacco abuse: smoking cessation counseling    Leukopenia: labile. Will continue to monitor   Thrombocytopenia: likely secondary to alcohol abuse    DVT prophylaxis: lovenox  Code Status: DNR Family Communication: Disposition Plan: likely d/c to SNF  Status is: Inpatient  Remains inpatient appropriate because:Altered mental status, IV treatments appropriate due to intensity of illness or inability to take PO and Inpatient level of care appropriate due to severity of illness, waiting on SNF placement still    Dispo: The patient is from: Home              Anticipated d/c is to: SNF              Anticipated d/c date is: whenever a SNF bed is available               Patient currently is medically stable for d/c       Consultants:   Palliative care  Cardio    Procedures:    Antimicrobials:     Subjective: Pt c/o being cold   Objective: Vitals:   07/27/20 1143 07/27/20 1658 07/27/20 1946 07/28/20 0344  BP: 108/86 115/83 111/81 110/80  Pulse: 83 69 75 74  Resp: _0 Temp: 97.8 F (36.6 C) 97.8 F (36.6 C) (!) 97.5 F (36.4 C) (!) 97.5 F (36.4 C)  TempSrc:   Axillary Oral  SpO2: 98% 99% 94% 92%  Weight:      Height:        Intake/Output Summary (Last 24 hours) at 07/28/2020 8921 Last data filed at 07/27/2020 1236 Gross per 24 hour  Intake 536.77 ml  Output 600 ml  Net -63.23 ml   Filed Weights   07/18/20 1120  Weight: 80 kg    Examination:  General exam: Appears older than stated age. Respiratory system: diminished breath sounds b/l  Cardiovascular system: irregularly irregular. No rubs or clicks  Gastrointestinal system: Abd is soft, NT, ND & normal bowel sounds  Central nervous system: Moves all 4 extremities. Alert and awake  Psychiatry: Judgement and insight appear abnormal. Flat mood and affect     Data Reviewed: I have personally reviewed following labs and imaging studies  CBC: Recent Labs  Lab 07/22/20 0528 07/23/20 0430 07/24/20 0612 07/25/20 0344 07/26/20 0659 07/26/20 1844 07/27/20 0621  WBC 4.2 4.3 5.7 3.2* 4.7  --  4.6  NEUTROABS 3.1 2.9  --   --   --   --   --  HGB 12.9* 13.5 12.9* 12.3* 12.3* 11.5* 12.6*  HCT 37.7* 39.4 38.3* 37.6* 36.4* 34.8* 38.3*  MCV 102.4* 102.9* 103.5* 105.0* 103.4*  --  104.6*  PLT 90* 87* 91* 111* 110*  --  629*   Basic Metabolic Panel: Recent Labs  Lab 07/23/20 0430 07/24/20 0612 07/25/20 0344 07/26/20 0659 07/27/20 0621  NA 144 142 143 145 146*  K 3.3* 3.6 3.6 3.4* 3.4*  CL 106 105 106 108 107  CO2 _0 GLUCOSE 80 99 104* 97 88  BUN _1 CREATININE 0.47* 0.41* 0.45* 0.39* 0.40*  CALCIUM 7.8* 8.2* 8.0* 7.9* 8.3*  MG  --   --   --  2.2  --    GFR: Estimated Creatinine Clearance: 105.4 mL/min (A) (by C-G formula based on SCr of 0.4 mg/dL (L)). Liver  Function Tests: Recent Labs  Lab 07/22/20 0528 07/23/20 0430  AST 28 25  ALT 17 15  ALKPHOS 60 68  BILITOT 1.2 1.5*  PROT 5.3* 5.2*  ALBUMIN 2.3* 2.2*   No results for input(s): LIPASE, AMYLASE in the last 168 hours. No results for input(s): AMMONIA in the last 168 hours. Coagulation Profile: No results for input(s): INR, PROTIME in the last 168 hours. Cardiac Enzymes: No results for input(s): CKTOTAL, CKMB, CKMBINDEX, TROPONINI in the last 168 hours. BNP (last 3 results) No results for input(s): PROBNP in the last 8760 hours. HbA1C: No results for input(s): HGBA1C in the last 72 hours. CBG: No results for input(s): GLUCAP in the last 168 hours. Lipid Profile: No results for input(s): CHOL, HDL, LDLCALC, TRIG, CHOLHDL, LDLDIRECT in the last 72 hours. Thyroid Function Tests: No results for input(s): TSH, T4TOTAL, FREET4, T3FREE, THYROIDAB in the last 72 hours. Anemia Panel: Recent Labs    07/26/20 0659 07/27/20 0621  FERRITIN 699* 797*   Sepsis Labs: No results for input(s): PROCALCITON, LATICACIDVEN in the last 168 hours.  Recent Results (from the past 240 hour(s))  Culture, blood (routine x 2)     Status: None   Collection Time: 07/18/20  8:13 AM   Specimen: BLOOD RIGHT HAND  Result Value Ref Range Status   Specimen Description BLOOD RIGHT HAND  Final   Special Requests   Final    BOTTLES DRAWN AEROBIC AND ANAEROBIC Blood Culture adequate volume   Culture   Final    NO GROWTH 5 DAYS Performed at [facility], [address] [place], [place] [address]    Report Status 07/23/2020 FINAL  Final  Resp Panel by RT-PCR (Flu A&B, Covid) Nasopharyngeal Swab     Status: Abnormal   Collection Time: 07/18/20  9:06 AM   Specimen: Nasopharyngeal Swab; Nasopharyngeal(NP) swabs in vial transport medium  Result Value Ref Range Status   SARS Coronavirus 2 by RT PCR POSITIVE (A) NEGATIVE Final    Comment: RESULT CALLED TO, READ BACK BY AND VERIFIED WITH: REINA  GALJOUR AT 6503 07/18/20.PMF (NOTE) SARS-CoV-2 target nucleic acids are DETECTED.  The SARS-CoV-2 RNA is generally detectable in upper respiratory specimens during the acute phase of infection. Positive results are indicative of the presence of the identified virus, but do not rule out bacterial infection or co-infection with other pathogens not detected by the test. Clinical correlation with patient history and other diagnostic information is necessary to determine patient infection status. The expected result is Negative.  Fact Sheet for Patients: EntrepreneurPulse.com.au  Fact Sheet for Healthcare Providers: IncredibleEmployment.be  This test is not  yet approved or cleared by the [place] and  has been authorized for detection and/or diagnosis of SARS-CoV-2 by FDA under an Emergency Use Authorization (EUA).  This EUA will remain in effect (meaning this test can be  used) for the duration of  the COVID-19 declaration under Section 564(b)(1) of the Act, 21 U.S.C. section 360bbb-3(b)(1), unless the authorization is terminated or revoked sooner.     Influenza A by PCR NEGATIVE NEGATIVE Final   Influenza B by PCR NEGATIVE NEGATIVE Final    Comment: (NOTE) The Xpert Xpress SARS-CoV-2/FLU/RSV plus assay is intended as an aid in the diagnosis of influenza from Nasopharyngeal swab specimens and should not be used as a sole basis for treatment. Nasal washings and aspirates are unacceptable for Xpert Xpress SARS-CoV-2/FLU/RSV testing.  Fact Sheet for Patients: EntrepreneurPulse.com.au  Fact Sheet for Healthcare Providers: IncredibleEmployment.be  This test is not yet approved or cleared by the [place] FDA and has been authorized for detection and/or diagnosis of SARS-CoV-2 by FDA under an Emergency Use Authorization (EUA). This EUA will remain in effect (meaning this test can be used) for the  duration of the COVID-19 declaration under Section 564(b)(1) of the Act, 21 U.S.C. section 360bbb-3(b)(1), unless the authorization is terminated or revoked.  Performed at [facility], [address] [place], [place] [address]   Culture, blood (routine x 2)     Status: None   Collection Time: 07/18/20  1:00 PM   Specimen: BLOOD  Result Value Ref Range Status   Specimen Description BLOOD LEFT ANTECUBITAL  Final   Special Requests   Final    BOTTLES DRAWN AEROBIC AND ANAEROBIC Blood Culture results may not be optimal due to an inadequate volume of blood received in culture bottles   Culture   Final    NO GROWTH 5 DAYS Performed at [facility], [address], [place], [place] [address]    Report Status 07/23/2020 FINAL  Final         Radiology Studies: No results found.      Scheduled Meds: . amiodarone  400 mg Oral BID  . chlordiazePOXIDE  25 mg Oral TID  . enoxaparin (LOVENOX) injection  40 mg Subcutaneous Q24H  . folic acid  1 mg Oral Daily  . LORazepam  2 mg Intravenous Once  . methylPREDNISolone (SOLU-MEDROL) injection  20 mg Intravenous Daily  . thiamine  100 mg Oral Daily   Continuous Infusions: . sodium chloride Stopped (07/27/20 0617)     LOS: 10 days    Time spent: 28 mins    Wyvonnia Dusky, MD [facility] Pager [phone]  If 7PM-7AM, please contact night-coverage 07/28/2020, 7:28 AM

## 2020-07-28 NOTE — Progress Notes (Signed)
SLP F/U Note  Patient Details Name: William [name] MRN: [id number] DOB: [birth year]   Cancelled treatment:       Reason Eval/Treat Not Completed:  (consulted by NSG re: pt's swallowing status this AM). ST services will f/u w/ pt's status w/ NSG.     Jerilynn Som, MS, CCC-SLP Speech Language Pathologist Rehab Services [phone] [facility] 07/28/2020, 3:31 PM

## 2020-07-28 NOTE — Progress Notes (Signed)
*  PRELIMINARY RESULTS* Echocardiogram 2D Echocardiogram has been performed.  William [name] 07/28/2020, 7:04 PM

## 2020-07-28 NOTE — Progress Notes (Signed)
Received notification from OT and RN that Pt was having some dysphagia with ice cream and meals. Chart reviewed and noted previous ST visits. Pt has a high risk of aspiration with all PO's but has elected to continue eating despite risks, with a wish for no feeding tubes. In an attempt to honor Patients wishes a Dys 3 diet was started . Palliative care has been involved with Pt's care and has thoroughly educated Pt and family on risks of aspiration with continued Po intake. As Pt wishes to continue eating, would rec that Pt be encouraged to eat slowly and cough hard whenever he shows signs of aspiration.

## 2020-07-28 NOTE — Evaluation (Signed)
Occupational Therapy Evaluation Patient Details Name: William [name] MRN: [id number] DOB: Nov 13, 1955 Today's Date: 07/28/2020    History of Present Illness William [name] is a 65 y.o. male with medical history significant of Wernicke encephalopathy, hypertension, alcohol and tobacco abuse who is brought to the emergency department via EMS 2020-07-28 after sustaining an unwitnessed fall at home last night with inability to stand up afterwards and remained on the floor for about 12 hours.   Clinical Impression   Pt was seen for OT evaluation this date. Pt alerts easily and oriented x3. Follows commands with cues and additional time. Pt able to perform bed level grooming with set up for washing face. Set up and close supervision initially to self feed chocolate ice cream but noted to take large bites despite cues, requiring therapist to provide increased verbal cues with each bite and set up spoon several times for smaller bites. Noted some coughing afterwards RN and SLP notified promptly. HOB kept elevated. Anticipate Mod-Max for bed mobility, Max A for LB ADL, Min A for UB ADL given impairments in strength, activity tolerance, cognition, and balance. Currently pt demonstrates impairments as described below (See OT problem list) which functionally limit his ability to perform ADL/self-care tasks. Pt would benefit from skilled OT services to address noted impairments and functional limitations (see below for any additional details) in order to maximize safety and independence while minimizing falls risk and caregiver burden. Upon hospital discharge, recommend STR to maximize pt safety and return to PLOF.     Follow Up Recommendations  Supervision/Assistance - 24 hour;SNF    Equipment Recommendations  3 in 1 bedside commode    Recommendations for Other Services       Precautions / Restrictions Precautions Precautions: Fall Precaution Comments: wounds, bleeding; B mitts Restrictions Weight Bearing  Restrictions: No      Mobility Bed Mobility Overal bed mobility: Needs Assistance Bed Mobility: Supine to Sit;Sit to Supine     Supine to sit: Mod assist;Max assist;HOB elevated (assist for trunk; vc's for LE's) Sit to supine: Min assist;HOB elevated (assist for LE's)   General bed mobility comments: deferred    Transfers Overall transfer level: Needs assistance Equipment used: Rolling walker (2 wheeled) Transfers: Sit to/from Stand Sit to Stand: Total assist         General transfer comment: deferred    Balance Overall balance assessment: Needs assistance;History of Falls Sitting-balance support: Single extremity supported;Feet supported Sitting balance-Leahy Scale: Poor Sitting balance - Comments: pt occasionally with posterior lean or slumping forward requiring cueing for upright posture (pt able to hold upright posture sitting for short periods of time)                                   ADL either performed or assessed with clinical judgement   ADL Overall ADL's : Needs assistance/impaired                                       General ADL Comments: set up for washing face; set up and close supervision initially to self feed but noted to take large bites of ice cream, requiring therapist to provide increased verbal cues with each bite and set up spoon several times for smaller bites. Noted to cough some afterwards. HOB kept elevated and RN notified after pt endorsed feeling ok. Anticipate  Mod-Max for bed mobility, transfers deferred. Max A for LB ADL, Min A for UB ADL     Vision Baseline Vision/History: Wears glasses Wears Glasses: Reading only (doesn't have them with him) Patient Visual Report: No change from baseline       Perception     Praxis      Pertinent Vitals/Pain Pain Assessment: No/denies pain Faces Pain Scale: No hurt Pain Intervention(s): Limited activity within patient's tolerance;Monitored during  session;Repositioned     Hand Dominance Right   Extremity/Trunk Assessment Upper Extremity Assessment Upper Extremity Assessment: Generalized weakness   Lower Extremity Assessment Lower Extremity Assessment: Generalized weakness       Communication Communication Communication: Other (comment) (poor projection)   Cognition Arousal/Alertness: Awake/alert Behavior During Therapy: Flat affect Overall Cognitive Status: No family/caregiver present to determine baseline cognitive functioning                                 General Comments: alert and oriented x3, follows commands with cues   General Comments       Exercises Other Exercises Other Exercises: bed level grooming and self feeding requiring increased time and cues/assist for safety   Shoulder Instructions      Home Living Family/patient expects to be discharged to:: Skilled nursing facility Living Arrangements: Alone                                      Prior Functioning/Environment Level of Independence: Needs assistance  Gait / Transfers Assistance Needed: RW intermittently in home; buggy needed when out ADL's / Homemaking Assistance Needed: appears unkempt, disheveled   Comments: c rolling walker        OT Problem List: Decreased strength;Cardiopulmonary status limiting activity;Decreased cognition;Decreased safety awareness;Decreased activity tolerance;Impaired balance (sitting and/or standing);Decreased knowledge of use of DME or AE      OT Treatment/Interventions: Self-care/ADL training;Therapeutic exercise;Therapeutic activities;DME and/or AE instruction;Patient/family education;Cognitive remediation/compensation;Balance training    OT Goals(Current goals can be found in the care plan section) Acute Rehab OT Goals Patient Stated Goal: none stated OT Goal Formulation: With patient Time For Goal Achievement: 08/11/20 Potential to Achieve Goals: Fair ADL Goals Pt Will  Perform Eating: with supervision;with set-up;sitting Pt Will Perform Grooming: with set-up;with supervision;sitting Pt Will Transfer to Toilet: with mod assist;stand pivot transfer;bedside commode (LRAD) Additional ADL Goal #1: Pt will perform bed mobility in preparation for EOB ADL tasks with Min-Mod A  OT Frequency: Min 1X/week   Barriers to D/C:            Co-evaluation              AM-PAC OT "6 Clicks" Daily Activity     Outcome Measure Help from another person eating meals?: A Little Help from another person taking care of personal grooming?: A Little Help from another person toileting, which includes using toliet, bedpan, or urinal?: A Lot Help from another person bathing (including washing, rinsing, drying)?: A Lot Help from another person to put on and taking off regular upper body clothing?: A Little Help from another person to put on and taking off regular lower body clothing?: A Lot 6 Click Score: 15   End of Session Nurse Communication: Other (comment) (mild coughing noted after ice cream)  Activity Tolerance: Patient tolerated treatment well Patient left: in bed;with call bell/phone within reach;with bed alarm set  OT Visit Diagnosis: Other abnormalities of gait and mobility (R26.89);Muscle weakness (generalized) (M62.81);Other symptoms and signs involving cognitive function                Time: 1417-1453 OT Time Calculation (min): 36 min Charges:  OT General Charges $OT Visit: 1 Visit OT Evaluation $OT Eval Moderate Complexity: 1 Mod OT Treatments $Self Care/Home Management : 23-37 mins  Richrd Prime, MPH, MS, OTR/L ascom [phone] 07/28/20, 3:31 PM

## 2020-07-29 DIAGNOSIS — J1282 Pneumonia due to coronavirus disease 2019: Secondary | ICD-10-CM | POA: Diagnosis not present

## 2020-07-29 DIAGNOSIS — F10230 Alcohol dependence with withdrawal, uncomplicated: Secondary | ICD-10-CM | POA: Diagnosis not present

## 2020-07-29 DIAGNOSIS — U071 COVID-19: Secondary | ICD-10-CM | POA: Diagnosis not present

## 2020-07-29 DIAGNOSIS — R1310 Dysphagia, unspecified: Secondary | ICD-10-CM | POA: Diagnosis not present

## 2020-07-29 LAB — BASIC METABOLIC PANEL
Anion gap: 8 (ref 5–15)
BUN: 8 mg/dL (ref 8–23)
CO2: 28 mmol/L (ref 22–32)
Calcium: 8.1 mg/dL — ABNORMAL LOW (ref 8.9–10.3)
Chloride: 106 mmol/L (ref 98–111)
Creatinine, Ser: 0.43 mg/dL — ABNORMAL LOW (ref 0.61–1.24)
GFR, Estimated: >60 mL/min (ref >60)
Glucose, Bld: 93 mg/dL (ref 70–99)
Potassium: 3.5 mmol/L (ref 3.5–5.1)
Sodium: 142 mmol/L (ref 135–145)

## 2020-07-29 LAB — CBC
HCT: 36.6 % — ABNORMAL LOW (ref 39.0–52.0)
Hemoglobin: 12.3 g/dL — ABNORMAL LOW (ref 13.0–17.0)
MCH: 34 pg (ref 26.0–34.0)
MCHC: 33.6 g/dL (ref 30.0–36.0)
MCV: 101.1 fL — ABNORMAL HIGH (ref 80.0–100.0)
Platelets: 121 10*3/uL — ABNORMAL LOW (ref 150–400)
RBC: 3.62 MIL/uL — ABNORMAL LOW (ref 4.22–5.81)
RDW: 12.7 % (ref 11.5–15.5)
WBC: 3.8 10*3/uL — ABNORMAL LOW (ref 4.0–10.5)
nRBC: 0 % (ref 0.0–0.2)

## 2020-07-29 NOTE — Progress Notes (Signed)
PROGRESS NOTE   HPI was taken from Dr. Olevia Bowens: William [name] is a 65 y.o. male with medical history significant of Warnicke encephalopathy, hypertension, alcohol and tobacco abuse who is brought to the emergency department via EMS after sustaining an unwitnessed fall at home last night with inability to stand up afterwards and remained on the floors for about 12 hours.  He was found by his sister who called EMS.  The patient has a history of frequent falls and chronic alcohol use (about 18 cans of beer daily).  He denies losing consciousness and complains of back pain.  He is oriented to time and place, partially oriented to situation and disoriented to time and date. He denies headache, chest pain, back or abdominal pain at this time.   Hamilton Marinello  MHW:[id number] DOB: [birth year] DOA: 07/31/2020 [facility]CP: [facility]    Assessment & Plan:   Principal Problem:   Alcohol withdrawal ([place]) Active Problems:   Tobacco abuse   Thrombocytopenia ([place])   Leukocytopenia   CAP (community acquired pneumonia)   Pneumonia due to COVID-19 virus   Severe sepsis: met criteria w/ tachycardia, leukopenia, aspiration pneumonia & COVID-19 pneumonia. Resolved  Pneumonia: secondary to COVID19 & aspiration. Completed remdesivir, abx & steroid courses. Continue on bronchodilators and encourage incentive spirometry   Acute metabolic encephalopathy: w/ hx of alcohol abuse/WE. CT head is neg. Re-orient prn   Dysphagia: continue on dysphagia III diet as pt has assumed the risk of recurrent aspiration pneumonia  A. fib: new & w/ intermittent RVR. Back in sinus rhythm. Continue on amio as per cardio. No anticoagulation secondary to high fall risk. Cardio recs apprec  Alcohol abuse: w/ history of Warnicke's encephalopathy. Continue on librium. Ativan prn   Hypokalemia: WNL today   Hyponatremia: resolved  Tobacco abuse: smoking cessation counseling.  Leukopenia: labile. Likely secondary to alcohol  abuse  Thrombocytopenia: likely secondary to alcohol abuse   DVT prophylaxis: lovenox  Code Status: DNR Family Communication: Disposition Plan: likely d/c to SNF  Status is: Inpatient  Remains inpatient appropriate because:Altered mental status, IV treatments appropriate due to intensity of illness or inability to take PO and Inpatient level of care appropriate due to severity of illness, waiting on SNF placement still as per CM    Dispo: The patient is from: Home              Anticipated d/c is to: SNF              Anticipated d/c date is: whenever a SNF bed is available               Patient currently is medically stable for d/c       Consultants:   Palliative care  Cardio    Procedures:    Antimicrobials:    Subjective: Pt intermittently answers questions appropriately   Objective: Vitals:   07/28/20 0344 07/28/20 1039 07/28/20 2038 07/29/20 0618  BP: 110/80 115/80 111/75 110/72  Pulse: 74 81 63 78  Resp: 18 16  14   Temp: (!) 97.5 F (36.4 C) (!) 97.5 F (36.4 C) 97.6 F (36.4 C) 98.2 F (36.8 C)  TempSrc: Oral Oral Oral Oral  SpO2: 92% 92% 95% 99%  Weight:      Height:        Intake/Output Summary (Last 24 hours) at 07/29/2020 0725 Last data filed at 07/28/2020 0730 Gross per 24 hour  Intake -  Output 250 ml  Net -250 ml   [employer]  07/18/20 1120  Weight: 80 kg    Examination:  General exam: Appears older than stated age Respiratory system: decreased breath sounds b/l.  Cardiovascular system: S1 & S2+. No rubs or clicks  Gastrointestinal system: Abd is soft, NT, ND & normal bowel sounds  Central nervous system: Moves all 4 extremities  Psychiatry: judgement and insight appear abnormal. Flat mood and affect    Data Reviewed: I have personally reviewed following labs and imaging studies  CBC: Recent Labs  Lab 07/23/20 0430 07/24/20 0612 07/25/20 0344 07/26/20 0659 07/26/20 1844 07/27/20 0621 07/28/20 0758 07/29/20 0604   WBC 4.3   < > 3.2* 4.7  --  4.6 3.5* 3.8*  NEUTROABS 2.9  --   --   --   --   --   --   --   HGB 13.5   < > 12.3* 12.3* 11.5* 12.6* 12.4* 12.3*  HCT 39.4   < > 37.6* 36.4* 34.8* 38.3* 36.6* 36.6*  MCV 102.9*   < > 105.0* 103.4*  --  104.6* 103.1* 101.1*  PLT 87*   < > 111* 110*  --  129* 126* 121*   < > = values in this interval not displayed.   Basic Metabolic Panel: Recent Labs  Lab 07/25/20 0344 07/26/20 0659 07/27/20 0621 07/28/20 0758 07/29/20 0604  NA 143 145 146* 143 142  K 3.6 3.4* 3.4* 3.5 3.5  CL 106 108 107 106 106  CO2 30 29 30 30 28   GLUCOSE 104* 97 88 88 93  BUN 13 13 11 9 8   CREATININE 0.45* 0.39* 0.40* 0.44* 0.43*  CALCIUM 8.0* 7.9* 8.3* 8.2* 8.1*  MG  --  2.2  --   --   --    GFR: Estimated Creatinine Clearance: 105.4 mL/min (A) (by C-G formula based on SCr of 0.43 mg/dL (L)). Liver Function Tests: Recent Labs  Lab 07/23/20 0430  AST 25  ALT 15  ALKPHOS 68  BILITOT 1.5*  PROT 5.2*  ALBUMIN 2.2*   No results for input(s): LIPASE, AMYLASE in the last 168 hours. No results for input(s): AMMONIA in the last 168 hours. Coagulation Profile: No results for input(s): INR, PROTIME in the last 168 hours. Cardiac Enzymes: No results for input(s): CKTOTAL, CKMB, CKMBINDEX, TROPONINI in the last 168 hours. BNP (last 3 results) No results for input(s): PROBNP in the last 8760 hours. HbA1C: No results for input(s): HGBA1C in the last 72 hours. CBG: No results for input(s): GLUCAP in the last 168 hours. Lipid Profile: No results for input(s): CHOL, HDL, LDLCALC, TRIG, CHOLHDL, LDLDIRECT in the last 72 hours. Thyroid Function Tests: No results for input(s): TSH, T4TOTAL, FREET4, T3FREE, THYROIDAB in the last 72 hours. Anemia Panel: Recent Labs    07/27/20 0621 07/28/20 0758  FERRITIN 797* 750*   Sepsis Labs: No results for input(s): PROCALCITON, LATICACIDVEN in the last 168 hours.  No results found for this or any previous visit (from the past 240  hour(s)).       Radiology Studies: No results found.      Scheduled Meds: . amiodarone  200 mg Oral BID  . chlordiazePOXIDE  25 mg Oral TID  . enoxaparin (LOVENOX) injection  40 mg Subcutaneous Q24H  . folic acid  1 mg Oral Daily  . LORazepam  2 mg Intravenous Once  . thiamine  100 mg Oral Daily   Continuous Infusions: . sodium chloride Stopped (07/27/20 0617)     LOS: 11 days    Time  spent: 25 mins    Wyvonnia Dusky, MD [facility] Pager [phone]  If 7PM-7AM, please contact night-coverage 07/29/2020, 7:25 AM

## 2020-07-29 NOTE — TOC Progression Note (Signed)
Transition of Care [facility]) - Progression Note    Patient Details  Name: William [name] MRN: [id number] Date of Birth: 10-08-1955  Transition of Care [facility]) CM/SW Contact  Liliana Cline, LCSW Phone Number: 07/29/2020, 1:43 PM  Clinical Narrative:   CSW left voicemail for [place], [place] SW, requested return call with update regarding VA approval for SNF.   Expected Discharge Plan: Skilled Nursing Facility Barriers to Discharge: Continued Medical Work up,Inadequate or no insurance  Expected Discharge Plan and Services Expected Discharge Plan: Skilled Nursing Facility     Post Acute Care Choice: Skilled Nursing Facility Living arrangements for the past 2 months: Mobile Home                                       Social Determinants of Health (SDOH) Interventions    Readmission Risk Interventions No flowsheet data found.

## 2020-07-30 ENCOUNTER — Inpatient Hospital Stay: Payer: No Typology Code available for payment source

## 2020-07-30 DIAGNOSIS — J9601 Acute respiratory failure with hypoxia: Secondary | ICD-10-CM

## 2020-07-30 LAB — CBC
HCT: 38.8 % — ABNORMAL LOW (ref 39.0–52.0)
Hemoglobin: 13.4 g/dL (ref 13.0–17.0)
MCH: 34.7 pg — ABNORMAL HIGH (ref 26.0–34.0)
MCHC: 34.5 g/dL (ref 30.0–36.0)
MCV: 100.5 fL — ABNORMAL HIGH (ref 80.0–100.0)
Platelets: 115 10*3/uL — ABNORMAL LOW (ref 150–400)
RBC: 3.86 MIL/uL — ABNORMAL LOW (ref 4.22–5.81)
RDW: 12.9 % (ref 11.5–15.5)
WBC: 5.5 10*3/uL (ref 4.0–10.5)
nRBC: 0 % (ref 0.0–0.2)

## 2020-07-30 LAB — BASIC METABOLIC PANEL
Anion gap: 9 (ref 5–15)
BUN: 7 mg/dL — ABNORMAL LOW (ref 8–23)
CO2: 29 mmol/L (ref 22–32)
Calcium: 8.3 mg/dL — ABNORMAL LOW (ref 8.9–10.3)
Chloride: 102 mmol/L (ref 98–111)
Creatinine, Ser: 0.47 mg/dL — ABNORMAL LOW (ref 0.61–1.24)
GFR, Estimated: >60 mL/min (ref >60)
Glucose, Bld: 83 mg/dL (ref 70–99)
Potassium: 3.4 mmol/L — ABNORMAL LOW (ref 3.5–5.1)
Sodium: 140 mmol/L (ref 135–145)

## 2020-07-30 LAB — ECHOCARDIOGRAM COMPLETE
AR max vel: 2.31 cm2
AV Peak grad: 6 mmHg
Ao pk vel: 1.22 m/s
Area-P 1/2: 3.68 cm2
Height: 73 in
S' Lateral: 3.9 cm
Weight: 2821.89 oz

## 2020-07-30 MED ORDER — FOLIC ACID 5 MG/ML IJ SOLN
1.0000 mg | Freq: Every day | INTRAMUSCULAR | Status: DC
  Administered 2020-07-30 – 2020-08-01 (×3): 1 mg via INTRAVENOUS
  Filled 2020-07-30 (×5): qty 0.2

## 2020-07-30 MED ORDER — THIAMINE HCL 100 MG/ML IJ SOLN
100.0000 mg | Freq: Every day | INTRAMUSCULAR | Status: DC
  Administered 2020-07-30 – 2020-08-02 (×4): 100 mg via INTRAVENOUS
  Filled 2020-07-30 (×4): qty 2

## 2020-07-30 NOTE — Progress Notes (Signed)
Patient had an unwitnessed fall. VS are stable. Patient did not sustain any injuries.

## 2020-07-30 NOTE — Progress Notes (Addendum)
Patient is agitated refusing vital signs and medication.

## 2020-07-30 NOTE — Progress Notes (Signed)
Attending MD made aware of unwitnessed fall last night; ordering CT Head.   Low bed and floor mats in place. Will continue to monitor closely.  Bo Mcclintock, RN

## 2020-07-30 NOTE — Progress Notes (Signed)
1        [facility]   PATIENT NAME: William [name]    MR#:  [id number]  DATE OF BIRTH:  1955/11/25  SUBJECTIVE:  CHIEF COMPLAINT:  No chief complaint on file. Patient sleepy not wanting to participate in any conversation REVIEW OF SYSTEMS:  ROS unable to obtain due to his mental status DRUG ALLERGIES:  No Known Allergies VITALS:  Blood pressure (!) 105/59, pulse 85, temperature 97.9 F (36.6 C), resp. rate 18, height 6\' 1"  (1.854 m), weight 80 kg, SpO2 (!) 84 %. PHYSICAL EXAMINATION:  Physical Exam  General appears older than his stated age Respiratory decreased breath sounds bilaterally Cardiovascular S1-S2 normal, no murmur rubs or gallop CNS nonfocal, sleepy and lethargic Psychiatry flat mood and affect, currently is lethargic LABORATORY PANEL:  Male CBC Recent Labs  Lab 07/30/20 0755  WBC 5.5  HGB 13.4  HCT 38.8*  PLT 115*   ------------------------------------------------------------------------------------------------------------------ Chemistries  Recent Labs  Lab 07/26/20 0659 07/27/20 0621 07/30/20 0618  NA 145   < > 140  K 3.4*   < > 3.4*  CL 108   < > 102  CO2 29   < > 29  GLUCOSE 97   < > 83  BUN 13   < > 7*  CREATININE 0.39*   < > 0.47*  CALCIUM 7.9*   < > 8.3*  MG 2.2  --   --    < > = values in this interval not displayed.   RADIOLOGY:  CT HEAD WO CONTRAST  Result Date: 07/30/2020 CLINICAL DATA:  Status post fall last night. EXAM: CT HEAD WITHOUT CONTRAST TECHNIQUE: Contiguous axial images were obtained from the base of the skull through the vertex without intravenous contrast. COMPARISON:  Head CT scan August 02, 2020 FINDINGS: Brain: No evidence of acute infarction, hemorrhage, hydrocephalus, extra-axial collection or mass lesion/mass effect. 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 Vascular: No hyperdense vessel or unexpected calcification. Skull: Intact.   No focal lesion. Sinuses/Orbits: Mucosal thickening and scattered ethmoid air cells has progressed. The right sphenoid sinus is now almost completely opacified and there is some mucosal thickening in the left sphenoid sinus which is new since the prior exam. Small focus of mucosal thickening in the right maxillary sinus is also new. Orbits are negative. Other: None. IMPRESSION: No acute intracranial normality. Atrophy and chronic microvascular ischemic change. Worsened sinus disease since the prior examination. The right sphenoid sinus is now almost completely opacified. Electronically Signed   By: 09/14/2020 M.D.   On: 07/30/2020 09:30   ASSESSMENT AND PLAN:  65 year old male with a known history of alcohol and tobacco abuse, hypertension, Warnicke encephalopathy was admitted for sepsis due to pneumonia  Severe sepsis present on admission due to aspiration pneumonia and COVID-19 infection.  Now resolved Pneumonia secondary to COVID-19 and aspiration present on admission and now resolved.  Completed remdesivir, antibiotic and steroid courses. Acute hypoxic respiratory failure: Likely from aspiration events.  Still requiring 3 to 4 L oxygen by nasal cannula Acute metabolic encephalopathy from alcohol abuse and history of Warnicke encephalopathy.  CT head is negative.  Patient had unwitnessed fall last night and repeat CT head is negative.  He has nonfocal examination Alcohol abuse and withdrawal: On CIWA protocol Paroxysmal A. Fib: Converted to normal sinus rhythm.  Continue amiodarone 200 mg p.o. twice daily.  Not a candidate for chronic anticoagulation considering falls and heavy alcohol use.  Cardiology following History of dysphagia and recurrent aspirations: Continue dysphagia 3 diet and aspiration precautions Hypokalemia/hyponatremia - repleted and resolved Acute on chronic thrombocytopenia: Likely from alcoholism  Body mass index is 23.27 kg/m.    Overall poor prognosis.  We will  consult palliative care for goals of care discussion tomorrow  Status is: Inpatient  Remains inpatient appropriate because:Altered mental status   Dispo: The patient is from: Home              Anticipated d/c is to: SNF              Anticipated d/c date is: 2 days              Patient currently is not medically stable to d/c.  Still going through alcohol withdrawal and will need rehabilitation placement at discharge.  TOC team working on placement through [place]    DVT prophylaxis:       enoxaparin (LOVENOX) injection 40 mg Start: 07/19/20 1000 SCDs Start: 2020/08/06 2235     Family Communication: Updated patient's Sister Clydie Braun over phone at [phone] on 1/19   All the records are reviewed and case discussed with Care Management/Social Worker. Management plans discussed with the patient, family and they are in agreement.  CODE STATUS: DNR  TOTAL TIME TAKING CARE OF THIS PATIENT: 35 minutes.   More than 50% of the time was spent in counseling/coordination of care: YES  POSSIBLE D/C IN 2-3 DAYS, DEPENDING ON CLINICAL CONDITION.   Delfino Lovett M.D on 07/30/2020 at 4:50 PM  [facility]   CC: Primary care physician; Clinic, [facility]  Note: This dictation was prepared with Dragon dictation along with smaller phrase technology. Any transcriptional errors that result from this process are unintentional.

## 2020-07-30 NOTE — TOC Progression Note (Addendum)
Transition of Care [facility]) - Progression Note    Patient Details  Name: William [name] MRN: [id number] Date of Birth: 1956-06-15  Transition of Care [facility]) CM/SW Contact  Liliana Cline, LCSW Phone Number: 07/30/2020, 1:29 PM   Clinical Narrative:   Spoke to [facility] SW [place]. Patient approved for 32 days SNF rehab at one of [facility] contracted SNFs. Jasmine reported CSW should reach out to all SNFs on [facility] list even though list says some are not active. CSW starting SNF work up.  [place] SNFs:  -[facility]- spoke with Revonda Standard who will review referral, and find out if they have beds or are taking VA patients. -[facility]- sent information in HUB for Admissions to review -[facility]- left voicemail for Maralyn Sago in Admissions to inquire if they are taking VA patients. -[facility]- left voicemail for Whitney in Admissions to inquire if they are taking VA patients. -[employer] risk analyst- attempted call, not able to reach anyone -[employer]- left voicemail for Windell Moulding in Admissions to inquire if they are taking VA patients. -[facility]- Receptionist reported they are taking [place] patients. Left message for Admissions Worker [place], faxed referral to fax number [phone]   Expected Discharge Plan: Skilled Nursing Facility Barriers to Discharge: Continued Medical Work up,Inadequate or no insurance  Expected Discharge Plan and Services Expected Discharge Plan: Skilled Nursing Facility     Post Acute Care Choice: Skilled Nursing Facility Living arrangements for the past 2 months: Mobile Home                                       Social Determinants of Health (SDOH) Interventions    Readmission Risk Interventions No flowsheet data found.

## 2020-07-30 NOTE — Progress Notes (Signed)
PT Cancellation Note  Patient Details Name: William [name] MRN: [id number] DOB: [birth year]   Cancelled Treatment:    Reason Eval/Treat Not Completed: Other (comment).  Pt resting in bed upon PT arrival with B mitts donned.  Therapist attempted to have pt get OOB but pt told therapist he already knew how to walk and pt attempting to push therapist away anytime therapist got close to pt.  Nurse notified.  Will re-attempt PT session at a later date/time.  Hendricks Limes, PT 07/30/20, 3:33 PM

## 2020-07-30 NOTE — [place] FL2 (Signed)
[place] MEDICAID FL2 LEVEL OF CARE SCREENING TOOL     IDENTIFICATION  Patient Name: William [name] Birthdate: Apr 03, 1956 Sex: male Admission Date (Current Location): 07/31/2020  [place] and [place] Number:  Chiropodist and Address:  [facility], [address] [place], [place] [address]      Provider Number: [id number]  Attending Physician Name and Address:  Delfino Lovett, MD  Relative Name and Phone Number:  [name])   [phone] [facility] Phone)    Current Level of Care: Hospital Recommended Level of Care: Skilled Nursing Facility Prior Approval Number:    Date Approved/Denied:   PASRR Number: [id number]  Discharge Plan: SNF    Current Diagnoses: Patient Active Problem List   Diagnosis Date Noted  . Pneumonia due to COVID-19 virus 07/18/2020  . Severe sepsis (HCC)   . Dysphagia   . Hyponatremia   . DNR (do not resuscitate)   . Alcohol withdrawal (HCC) 08/09/2020  . Leukocytopenia 07/22/2020  . CAP (community acquired pneumonia) August 02, 2020  . Tachycardia   . Hypokalemia   . Hepatic steatosis   . Acute respiratory failure with hypoxia (HCC)   . COPD with acute exacerbation (HCC)   . Acute metabolic encephalopathy 12/06/2019  . Wernicke's encephalopathy   . Wheeze   . Alcohol abuse   . Tobacco abuse   . Thrombocytopenia (HCC)   . Weakness     Orientation RESPIRATION BLADDER Height & Weight     Self  Normal Incontinent,External catheter Weight: 176 lb 5.9 oz (80 kg) Height:  6\' 1"  (185.4 cm)  BEHAVIORAL SYMPTOMS/MOOD NEUROLOGICAL BOWEL NUTRITION STATUS  Other (Comment) (agitation at times)   Incontinent Diet (dys 3, thin liquids)  AMBULATORY STATUS COMMUNICATION OF NEEDS Skin   Extensive Assist Verbally Bruising,Skin abrasions (leg, arm)                       Personal Care Assistance Level of Assistance  Bathing,Feeding,Dressing Bathing Assistance: Maximum assistance Feeding assistance: Limited  assistance Dressing Assistance: Maximum assistance     Functional Limitations Info             SPECIAL CARE FACTORS FREQUENCY  PT (By licensed PT),OT (By licensed OT)     PT Frequency: 5 x/week OT Frequency: 5 x/week            Contractures      Additional Factors Info  Code Status,Allergies Code Status Info: DNR Allergies Info: nka           Current Medications (07/30/2020):  This is the current hospital active medication list Current Facility-Administered Medications  Medication Dose Route Frequency Provider Last Rate Last Admin  . 0.9 %  sodium chloride infusion   Intravenous PRN 08/01/2020, MD   Stopped at 07/27/20 0617  . acetaminophen (TYLENOL) tablet 650 mg  650 mg Oral Q6H PRN 07/29/20, MD       Or  . acetaminophen (TYLENOL) suppository 650 mg  650 mg Rectal Q6H PRN Bobette Mo, MD      . amiodarone (PACERONE) tablet 200 mg  200 mg Oral BID Bobette Mo, MD   200 mg at 07/29/20 1633  . chlordiazePOXIDE (LIBRIUM) capsule 25 mg  25 mg Oral TID 07/31/20, MD   25 mg at 07/29/20 1633  . enoxaparin (LOVENOX) injection 40 mg  40 mg Subcutaneous Q24H 07/31/20, MD   40 mg at 07/29/20 1633  . folic acid injection  1 mg  1 mg Intravenous Daily Sherryll Burger, Vipul, MD      . Ipratropium-Albuterol (COMBIVENT) respimat 1 puff  1 puff Inhalation Q4H PRN Kc, Ramesh, MD      . LORazepam (ATIVAN) injection 1 mg  1 mg Intravenous Q4H PRN Charise Killian, MD   1 mg at 07/29/20 2205  . LORazepam (ATIVAN) injection 2 mg  2 mg Intravenous Once Manuela Schwartz, NP      . ondansetron [facility]) tablet 4 mg  4 mg Oral Q6H PRN Bobette Mo, MD       Or  . ondansetron [facility]) injection 4 mg  4 mg Intravenous Q6H PRN Bobette Mo, MD      . thiamine (B-1) injection 100 mg  100 mg Intravenous Daily Delfino Lovett, MD         Discharge Medications: Please see discharge summary for a list of discharge medications.  Relevant  Imaging Results:  Relevant Lab Results:   Additional Information SS #: [id number]  Matha Masse E Sohan Potvin, LCSW

## 2020-07-30 NOTE — Progress Notes (Signed)
Patient Name: William [name] Date of Encounter: 07/30/2020  Hospital Problem List     Principal Problem:   Alcohol withdrawal (HCC) Active Problems:   Tobacco abuse   Thrombocytopenia (HCC)   Leukocytopenia   CAP (community acquired pneumonia)   Pneumonia due to COVID-19 virus    Patient Profile     65 y.o.malewith history ofWarnicke's encephalopathy, hypertension, alcohol and tobacco abuse who was admitted after sustaining an unwitnessed fall at his home and was unable to stand up. This admission was done on July 17, 2020. Per report, patient drinks about 18 beers a day. EKG on admission revealed sinus tachycardia versus SVT. EKG on July 24, 2020 revealed sinus rhythm. Now is developed atrial fibrillation with rapid ventricular response. Electrolytes currently show potassium 3.4. His magnesium was 2.2. He was started on amiodarone and is currently on 200 mg twice daily.  Subjective   Poor historian  Inpatient Medications    . amiodarone  200 mg Oral BID  . chlordiazePOXIDE  25 mg Oral TID  . enoxaparin (LOVENOX) injection  40 mg Subcutaneous Q24H  . folic acid  1 mg Intravenous Daily  . LORazepam  2 mg Intravenous Once  . thiamine injection  100 mg Intravenous Daily    Vital Signs    Vitals:   07/30/20 0150 07/30/20 0405 07/30/20 0743 07/30/20 1154  BP: 111/66 116/76 94/74 100/62  Pulse: 61 77 78 92  Resp: 18  18 18   Temp: 97.6 F (36.4 C)  98 F (36.7 C) (!) 97.5 F (36.4 C)  TempSrc:    Axillary  SpO2: (!) 89% 98% (!) 79% 94%  Weight:      Height:       No intake or output data in the 24 hours ending 07/30/20 1313 Filed Weights   07/18/20 1120  Weight: 80 kg    Physical Exam    GEN: Well nourished, well developed, in no acute distress.  HEENT: normal.  Neck: Supple, no JVD, carotid bruits, or masses. Cardiac: irr Respiratory:  Respirations regular and unlabored, clear to auscultation bilaterally. GI: Soft, nontender, nondistended, BS +  x 4. MS: no deformity or atrophy. Skin: warm and dry, no rash. Neuro:  Not able to give much history   Labs    CBC Recent Labs    07/29/20 0604 07/30/20 0755  WBC 3.8* 5.5  HGB 12.3* 13.4  HCT 36.6* 38.8*  MCV 101.1* 100.5*  PLT 121* 115*   Basic Metabolic Panel Recent Labs    08/01/20 0604 07/30/20 0618  NA 142 140  K 3.5 3.4*  CL 106 102  CO2 28 29  GLUCOSE 93 83  BUN 8 7*  CREATININE 0.43* 0.47*  CALCIUM 8.1* 8.3*   Liver Function Tests No results for input(s): AST, ALT, ALKPHOS, BILITOT, PROT, ALBUMIN in the last 72 hours. No results for input(s): LIPASE, AMYLASE in the last 72 hours. Cardiac Enzymes No results for input(s): CKTOTAL, CKMB, CKMBINDEX, TROPONINI in the last 72 hours. BNP No results for input(s): BNP in the last 72 hours. D-Dimer No results for input(s): DDIMER in the last 72 hours. Hemoglobin A1C No results for input(s): HGBA1C in the last 72 hours. Fasting Lipid Panel No results for input(s): CHOL, HDL, LDLCALC, TRIG, CHOLHDL, LDLDIRECT in the last 72 hours. Thyroid Function Tests No results for input(s): TSH, T4TOTAL, T3FREE, THYROIDAB in the last 72 hours.  Invalid input(s): FREET3  Telemetry    afib  ECG       Radiology  CT HEAD WO CONTRAST  Result Date: 07/30/2020 CLINICAL DATA:  Status post fall last night. EXAM: CT HEAD WITHOUT CONTRAST TECHNIQUE: Contiguous axial images were obtained from the base of the skull through the vertex without intravenous contrast. COMPARISON:  Head CT scan 07/19/2020 FINDINGS: Brain: No evidence of acute infarction, hemorrhage, hydrocephalus, extra-axial collection or mass lesion/mass effect. 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 Vascular: No hyperdense vessel or unexpected calcification. Skull: Intact.  No focal lesion. Sinuses/Orbits: Mucosal thickening and scattered ethmoid air cells has progressed. The right sphenoid  sinus is now almost completely opacified and there is some mucosal thickening in the left sphenoid sinus which is new since the prior exam. Small focus of mucosal thickening in the right maxillary sinus is also new. Orbits are negative. Other: None. IMPRESSION: No acute intracranial normality. Atrophy and chronic microvascular ischemic change. Worsened sinus disease since the prior examination. The right sphenoid sinus is now almost completely opacified. Electronically Signed   By: Drusilla Kanner M.D.   On: 07/30/2020 09:30   CT Head Wo Contrast  Result Date: 07/26/2020 CLINICAL DATA:  Larey Seat yesterday, found down after 12 hours, alcohol abuse EXAM: CT HEAD WITHOUT CONTRAST TECHNIQUE: Contiguous axial images were obtained from the base of the skull through the vertex without intravenous contrast. COMPARISON:  12/05/2019, 12/12/2019 FINDINGS: Brain: Confluent hypodensities within the periventricular white matter are compatible with chronic small vessel ischemic changes, stable since previous exam. Focal hypodensity within the left thalamus is new since previous study, with an appearance suggesting a chronic lacunar infarct. No evidence of acute infarct or hemorrhage. Stable diffuse cerebral atrophy. The lateral ventricles and midline structures are unremarkable. No acute extra-axial fluid collections. No mass effect. Vascular: No hyperdense vessel or unexpected calcification. Skull: Normal. Negative for fracture or focal lesion. Sinuses/Orbits: Kozel thickening within the bilateral ethmoid air cells. Remaining sinuses are clear. Other: None. IMPRESSION: 1. No acute intracranial process. 2. Stable chronic small vessel ischemic changes and cerebral atrophy. 3. Chronic appearing left thalamic lacunar infarct, new since previous study. Electronically Signed   By: Sharlet Salina M.D.   On: 07/13/2020 21:59   CT ANGIO CHEST PE W OR WO CONTRAST  Result Date: 07/19/2020 CLINICAL DATA:  65 year old male with positive  D-dimer. Concern for pulmonary embolism. EXAM: CT ANGIOGRAPHY CHEST WITH CONTRAST TECHNIQUE: Multidetector CT imaging of the chest was performed using the standard protocol during bolus administration of intravenous contrast. Multiplanar CT image reconstructions and MIPs were obtained to evaluate the vascular anatomy. CONTRAST:  86mL OMNIPAQUE IOHEXOL 350 MG/ML SOLN COMPARISON:  Chest radiograph dated 08/09/2020 FINDINGS: Cardiovascular: There is no cardiomegaly or pericardial effusion. The ascending thoracic aorta is dilated measuring up to 4.7 cm in diameter (coronal 32/13). Evaluation of the pulmonary arteries is limited due to respiratory motion artifact. No pulmonary artery embolus identified. Mediastinum/Nodes: No hilar or mediastinal adenopathy. The esophagus is grossly unremarkable. No mediastinal fluid collection. Lungs/Pleura: Small bilateral pleural effusions. Bilateral lower lobe subsegmental and subpleural consolidation may represent atelectasis or pneumonia. There are however clusters of nodular density in the lower lobes bilaterally concerning for pneumonia. Aspiration is not excluded. Clinical correlation is recommended. There is background of centrilobular emphysema. There is no pneumothorax. Mucus secretions noted in the trachea and bilateral mainstem bronchus. There is mucous impaction in the left lower lobe and to a lesser degree right lower lobe bronchi. Upper Abdomen: No acute abnormality. Musculoskeletal: Degenerative changes of the spine. No acute osseous  pathology. Review of the MIP images confirms the above findings. IMPRESSION: 1. No CT evidence of pulmonary embolism. 2. Small bilateral pleural effusions. 3. Bilateral lower lobe bronchi mucous impaction with bilateral lower lobes clusters of nodularity concerning for pneumonia versus aspiration. 4. aortic Atherosclerosis (ICD10-I70.0) and Emphysema (ICD10-J43.9). Electronically Signed   By: Elgie Collard M.D.   On: 07/19/2020 16:10    DG Chest Portable 1 View  Result Date: 07/19/2020 CLINICAL DATA:  Cough. EXAM: PORTABLE CHEST 1 VIEW COMPARISON:  12/08/2019 prior radiographs FINDINGS: The cardiomediastinal silhouette is unchanged. Chronic interstitial opacities are noted. Slightly increased RIGHT basilar opacity may represent atelectasis or airspace disease. No pleural effusion or pneumothorax identified. No acute bony abnormalities are noted. IMPRESSION: 1. Slightly increased RIGHT basilar opacity -question atelectasis or airspace disease. 2. Chronic interstitial opacities. Electronically Signed   By: Harmon Pier M.D.   On: 08/06/2020 18:03   ECHOCARDIOGRAM COMPLETE  Result Date: 07/30/2020    ECHOCARDIOGRAM REPORT   Patient Name:   ELMON SHADER Date of Exam: 07/28/2020 Medical Rec #:  [id number]    Height:       73.0 in Accession #:    [id number]   Weight:       176.4 lb Date of Birth:  Mar 13, 1956     BSA:          2.040 m Patient Age:    64 years     BP:           115/80 mmHg Patient Gender: M            HR:           81 bpm. Exam Location:  [facility] Procedure: 2D Echo, Cardiac Doppler and Color Doppler Indications:     Atrial Fibrillation I48.91  History:         Patient has no prior history of Echocardiogram examinations.  Sonographer:     Neysa Bonito Roar Referring Phys:  [id number] Darlin Priestly Beckie Viscardi Diagnosing Phys: Alwyn Pea MD IMPRESSIONS  1. Ant/Lateral/septal mid hypo.  2. Mildly depressed LVF.  3. Mid septal mid ant/Lateral Hypo. Left ventricular ejection fraction, by estimation, is 40 to 45%. The left ventricle has mildly decreased function. The left ventricle demonstrates regional wall motion abnormalities (see scoring diagram/findings for description). The left ventricular internal cavity size was mildly to moderately dilated. Left ventricular diastolic parameters are consistent with Grade III diastolic dysfunction (restrictive).  4. Right ventricular systolic function is low normal. The right ventricular size is normal.  5. The  mitral valve is normal in structure. No evidence of mitral valve regurgitation.  6. The aortic valve is grossly normal. Aortic valve regurgitation is trivial. FINDINGS  Left Ventricle: Mid septal mid ant/Lateral Hypo. Left ventricular ejection fraction, by estimation, is 40 to 45%. The left ventricle has mildly decreased function. The left ventricle demonstrates regional wall motion abnormalities. The left ventricular internal cavity size was mildly to moderately dilated. There is borderline left ventricular hypertrophy. Left ventricular diastolic parameters are consistent with Grade III diastolic dysfunction (restrictive). Right Ventricle: The right ventricular size is normal. No increase in right ventricular wall thickness. Right ventricular systolic function is low normal. Left Atrium: Left atrial size was normal in size. Right Atrium: Right atrial size was normal in size. Pericardium: Trivial pericardial effusion is present. Mitral Valve: The mitral valve is normal in structure. No evidence of mitral valve regurgitation. Tricuspid Valve: The tricuspid valve is normal in structure. Tricuspid valve regurgitation is mild. Aortic Valve: The aortic  valve is grossly normal. Aortic valve regurgitation is trivial. Aortic valve peak gradient measures 6.0 mmHg. Pulmonic Valve: The pulmonic valve was grossly normal. Pulmonic valve regurgitation is not visualized. Aorta: The ascending aorta was not well visualized. IAS/Shunts: No atrial level shunt detected by color flow Doppler. Additional Comments: Mildly depressed LVF. Ant/Lateral/septal mid hypo. A pacer wire is visualized.  LEFT VENTRICLE PLAX 2D LVIDd:         4.90 cm  Diastology LVIDs:         3.90 cm  LV e' medial:    6.96 cm/s LV PW:         1.00 cm  LV E/e' medial:  3.6 LV IVS:        1.30 cm  LV e' lateral:   4.90 cm/s LVOT diam:     2.10 cm  LV E/e' lateral: 5.2 LVOT Area:     3.46 cm  RIGHT VENTRICLE RV Mid diam:    3.30 cm RV S prime:     14.70 cm/s TAPSE  (M-mode): 2.2 cm LEFT ATRIUM             Index       RIGHT ATRIUM           Index LA diam:        3.80 cm 1.86 cm/m  RA Area:     21.60 cm LA Vol (A2C):   64.2 ml 31.48 ml/m RA Volume:   53.50 ml  26.23 ml/m LA Vol (A4C):   64.1 ml 31.43 ml/m LA Biplane Vol: 64.3 ml 31.52 ml/m  AORTIC VALVE                PULMONIC VALVE AV Area (Vmax): 2.31 cm    PV Vmax:        0.75 m/s AV Vmax:        122.00 cm/s PV Peak grad:   2.2 mmHg AV Peak Grad:   6.0 mmHg    RVOT Peak grad: 1 mmHg LVOT Vmax:      81.50 cm/s  AORTA Ao Root diam: 4.00 cm MITRAL VALVE MV Area (PHT): 3.68 cm    SHUNTS MV Decel Time: 206 msec    Systemic Diam: 2.10 cm MV E velocity: 25.30 cm/s MV A velocity: 69.40 cm/s MV E/A ratio:  0.36 MV A Prime:    9.8 cm/s Dwayne Salome Arnt Callwood MD Electronically signed by Alwyn Peawayne D Callwood MD Signature Date/Time: 07/30/2020/8:17:11 AM    Final     Assessment & Plan     65 year old male with history of ethanol use admitted with history of a fall. Has a history of of drinking approximately 18 beers a day. Was then probable sinus tachycardia on admission. Subsequent EKG showed sinus rhythm now in atrial fibrillation with somewhat rapid ventricular response. Currently on amiodarone at 200 twice daily. Not an ideal candidate for chronic anticoagulation due to ethanol abuse..   1.  Atrial fibrillation-appears to have converted to sinus rhythm.  We will continue with Toprol all and decrease amiodarone to 200 mg twice daily.  We will review echo when available but most likely will continue with current regimen.  Not a candidate for chronic anticoagulation at present given heavy ethanol use.  We will discuss this as an outpatient if he is compliant and limits alcohol.  2.  Alcohol abuse-cessation  3.  COVID-continue current therapy.  Signed, Darlin PriestlyKenneth A. Darrol Brandenburg MD 07/30/2020, 1:13 PM  Pager: [phone]

## 2020-07-31 MED ORDER — CHLORDIAZEPOXIDE HCL 5 MG PO CAPS
10.0000 mg | ORAL_CAPSULE | Freq: Three times a day (TID) | ORAL | Status: DC
  Administered 2020-07-31 – 2020-08-02 (×4): 10 mg via ORAL
  Filled 2020-07-31 (×5): qty 2

## 2020-07-31 NOTE — Progress Notes (Signed)
1        [place] [facility]   PATIENT NAME: William [name]    MR#:  [id number]  DATE OF BIRTH:  11/09/1955  SUBJECTIVE:  CHIEF COMPLAINT:  No chief complaint on file. awake but not interested in talking or participating with therapy.  REVIEW OF SYSTEMS:  ROS unable to obtain due to his mental status DRUG ALLERGIES:  No Known Allergies VITALS:  Blood pressure 108/67, pulse (!) 101, temperature 98 F (36.7 C), resp. rate 19, height 6\' 1"  (1.854 m), weight 80 kg, SpO2 (!) 85 %. PHYSICAL EXAMINATION:  Physical Exam  General appears older than his stated age Respiratory decreased breath sounds bilaterally Cardiovascular S1-S2 normal, no murmur rubs or gallop CNS nonfocal, awake but not interested in talking Psychiatry flat mood and affect LABORATORY PANEL:  Male CBC Recent Labs  Lab 07/30/20 0755  WBC 5.5  HGB 13.4  HCT 38.8*  PLT 115*   ------------------------------------------------------------------------------------------------------------------ Chemistries  Recent Labs  Lab 07/26/20 0659 07/27/20 0621 07/30/20 0618  NA 145   < > 140  K 3.4*   < > 3.4*  CL 108   < > 102  CO2 29   < > 29  GLUCOSE 97   < > 83  BUN 13   < > 7*  CREATININE 0.39*   < > 0.47*  CALCIUM 7.9*   < > 8.3*  MG 2.2  --   --    < > = values in this interval not displayed.   RADIOLOGY:  No results found. ASSESSMENT AND PLAN:  65 year old male with a known history of alcohol and tobacco abuse, hypertension, Warnicke encephalopathy was admitted for sepsis due to pneumonia  Severe sepsis present on admission due to aspiration pneumonia and COVID-19 infection.  Now resolved Pneumonia secondary to COVID-19 and aspiration present on admission and now resolved.  Completed remdesivir, antibiotic and steroid courses. Acute hypoxic respiratory failure: Likely from aspiration events.  Still requiring 3 to 4 L oxygen by nasal cannula Acute metabolic encephalopathy from alcohol abuse  and history of Warnicke encephalopathy.  CT head is negative.  Patient had unwitnessed fall last night and repeat CT head is negative.  He has nonfocal examination Alcohol abuse and withdrawal: On CIWA protocol, taper librium as tolerated Paroxysmal A. Fib: Converted to normal sinus rhythm.  Continue amiodarone 200 mg p.o. twice daily.  Not a candidate for chronic anticoagulation considering falls and heavy alcohol use.  Cardiology following History of dysphagia and recurrent aspirations: Continue dysphagia 3 diet and aspiration precautions Hypokalemia/hyponatremia - repleted and resolved Acute on chronic thrombocytopenia: Likely from alcoholism  Body mass index is 23.27 kg/m.    Overall poor prognosis.  Consult palliative care  Status is: Inpatient  Remains inpatient appropriate because:Altered mental status   Dispo: The patient is from: Home              Anticipated d/c is to: SNF              Anticipated d/c date is: 2 days              Patient currently is not medically stable to d/c.  Still going through alcohol withdrawal and will need rehabilitation placement at discharge.  TOC team working on placement through 77 but so far no luck    DVT prophylaxis:       enoxaparin (LOVENOX) injection 40 mg Start: 07/19/20 1000 SCDs Start: 07/19/20 2235     Family Communication: Updated  patient's Sister William Braun over phone at [phone] on 1/19   All the records are reviewed and case discussed with Care Management/Social Worker. Management plans discussed with the patient, nursing and they are in agreement.  CODE STATUS: DNR  TOTAL TIME TAKING CARE OF THIS PATIENT: 35 minutes.   More than 50% of the time was spent in counseling/coordination of care: YES  POSSIBLE D/C IN 2-3 DAYS, DEPENDING ON CLINICAL CONDITION.   Delfino Lovett M.D on 07/31/2020 at 3:49 PM  [facility]   CC: Primary care physician; [facility], [facility]  Note: This dictation was prepared with Dragon  dictation along with smaller phrase technology. Any transcriptional errors that result from this process are unintentional.

## 2020-07-31 NOTE — Progress Notes (Signed)
Physical Therapy Treatment Patient Details Name: Mouhamadou Gittleman MRN: [id number] DOB: [birth year] Today's Date: 07/31/2020    History of Present Illness Oral Remache is a 65 y.o. male with medical history significant of Wernicke encephalopathy, hypertension, alcohol and tobacco abuse who is brought to the emergency department via EMS 07-23-20 after sustaining an unwitnessed fall at home last night with inability to stand up afterwards and remained on the floor for about 12 hours.    PT Comments    Pt awake but generally non verbal today and not following verbal or tactile cues.  Resists exercises.  Unable to progress mobility at this time.  Follow Up Recommendations  SNF     Equipment Recommendations  Rolling walker with 5" wheels;3in1 (PT)    Recommendations for Other Services       Precautions / Restrictions Precautions Precautions: Fall Precaution Comments: wounds, bleeding; B mitts Restrictions Weight Bearing Restrictions: No    Mobility  Bed Mobility                  Transfers                    Ambulation/Gait                 Stairs             Wheelchair Mobility    Modified Rankin (Stroke Patients Only)       Balance                                            Cognition Arousal/Alertness: Awake/alert Behavior During Therapy: Flat affect Overall Cognitive Status: No family/caregiver present to determine baseline cognitive functioning                                 General Comments: non verbal tdoay and not following verbal or tactile cues      Exercises      General Comments        Pertinent Vitals/Pain Pain Assessment: No/denies pain    Home Living                      Prior Function            PT Goals (current goals can now be found in the care plan section) Progress towards PT goals: Not progressing toward goals - comment    Frequency    Min 2X/week      PT  Plan Current plan remains appropriate    Co-evaluation              AM-PAC PT "6 Clicks" Mobility   Outcome Measure  Help needed turning from your back to your side while in a flat bed without using bedrails?: Total Help needed moving from lying on your back to sitting on the side of a flat bed without using bedrails?: Total Help needed moving to and from a bed to a chair (including a wheelchair)?: Total Help needed standing up from a chair using your arms (e.g., wheelchair or bedside chair)?: Total Help needed to walk in hospital room?: Total Help needed climbing 3-5 steps with a railing? : Total 6 Click Score: 6    End of Session Equipment Utilized During Treatment: Gait belt Activity Tolerance: Patient limited by fatigue  Patient left: in bed;with call bell/phone within reach;with bed alarm set;Other (comment) (bed in lowest position) Nurse Communication: Mobility status (pt's IV laying on bed upon PT arrival) PT Visit Diagnosis: Difficulty in walking, not elsewhere classified (R26.2);Other abnormalities of gait and mobility (R26.89);Muscle weakness (generalized) (M62.81);Repeated falls (R29.6)     Time: 0931-1216 PT Time Calculation (min) (ACUTE ONLY): 15 min  Charges:  $Therapeutic Exercise: 8-22 mins                    Danielle Dess, PTA 07/31/20, 9:51 AM

## 2020-07-31 NOTE — TOC Progression Note (Addendum)
Transition of Care [facility]) - Progression Note    Patient Details  Name: William [name] MRN: [id number] Date of Birth: 10/18/1955  Transition of Care [facility]) CM/SW Contact  Liliana Cline, LCSW Phone Number: 07/31/2020, 2:24 PM  Clinical Narrative:   CSW still working on [place] SNF placement- see below list for placement updates. CSW called patient's sister William Braun to discuss patient refusing PT/OT here at the hospital and potential issues with finding SNF to accept patient due to this. Informed William Braun that CSW will continue to try and find SNF placement, asked about back up plan if unable to find SNF to accept patient. William Braun reported patient's trailer that he was living in prior to hospitalization is "unlivable." She reported there is no family patient can stay with due to substance use. She asked if she can talk with patient to try and encourage him to participate with PT and OT. She said she has tried calling into patient's room with no success. CSW asking RN if staff can assist patient with calling his sister when they have time, if patient is oriented enough to talk.   -[facility]- denied [facility]- not able to accept at this time due to behaviors, can reassess if this changes -[facility]- pending [facility]- pending -[facility]- pending -[facility]- not taking any residents at this time due to COVID/staffing -[place]- pending  CSW will continue to follow.  Expected Discharge Plan: Skilled Nursing Facility Barriers to Discharge: Continued Medical Work up,Inadequate or no insurance  Expected Discharge Plan and Services Expected Discharge Plan: Skilled Nursing Facility     Post Acute Care Choice: Skilled Nursing Facility Living arrangements for the past 2 months: Mobile Home                                       Social Determinants of Health (SDOH) Interventions    Readmission Risk Interventions No flowsheet data found.

## 2020-07-31 NOTE — Progress Notes (Signed)
Patient Name: William [name] Date of Encounter: 07/31/2020  Hospital Problem List     Principal Problem:   Alcohol withdrawal (HCC) Active Problems:   Tobacco abuse   Thrombocytopenia (HCC)   Leukocytopenia   CAP (community acquired pneumonia)   Pneumonia due to COVID-19 virus    Patient Profile      64 y.o.malewith history ofWarnicke's encephalopathy, hypertension, alcohol and tobacco abuse who was admitted after sustaining an unwitnessed fall at his home and was unable to stand up. This admission was done on 07-22-2020. Per report, patient drinks about 18 beers a day. EKG on admission revealed sinus tachycardia versus SVT. EKG on July 24, 2020 revealed sinus rhythm. Now is developed atrial fibrillation with rapid ventricular response. Electrolytes currently show potassium 3.4. His magnesium was 2.2. He was started on amiodarone and is currently on200 mg twice daily.  Subjective  Minimally responsive.   Inpatient Medications    . amiodarone  200 mg Oral BID  . chlordiazePOXIDE  25 mg Oral TID  . enoxaparin (LOVENOX) injection  40 mg Subcutaneous Q24H  . folic acid  1 mg Intravenous Daily  . LORazepam  2 mg Intravenous Once  . thiamine injection  100 mg Intravenous Daily    Vital Signs    Vitals:   07/30/20 1524 07/30/20 2043 07/31/20 0037 07/31/20 0732  BP: (!) 105/59 (!) 127/92 101/60 109/60  Pulse: 85 88 99 (!) 106  Resp: 18 20 20 18   Temp: 97.9 F (36.6 C) 98.9 F (37.2 C) 98.9 F (37.2 C) 97.9 F (36.6 C)  TempSrc:  Oral    SpO2: (!) 84% 92% 90% (!) 85%  Weight:      Height:       No intake or output data in the 24 hours ending 07/31/20 0757 Filed Weights   07/18/20 1120  Weight: 80 kg    Physical Exam      Labs    CBC Recent Labs    07/29/20 0604 07/30/20 0755  WBC 3.8* 5.5  HGB 12.3* 13.4  HCT 36.6* 38.8*  MCV 101.1* 100.5*  PLT 121* 115*   Basic Metabolic Panel Recent Labs    08/01/20 0604 07/30/20 0618  NA 142  140  K 3.5 3.4*  CL 106 102  CO2 28 29  GLUCOSE 93 83  BUN 8 7*  CREATININE 0.43* 0.47*  CALCIUM 8.1* 8.3*   Liver Function Tests No results for input(s): AST, ALT, ALKPHOS, BILITOT, PROT, ALBUMIN in the last 72 hours. No results for input(s): LIPASE, AMYLASE in the last 72 hours. Cardiac Enzymes No results for input(s): CKTOTAL, CKMB, CKMBINDEX, TROPONINI in the last 72 hours. BNP No results for input(s): BNP in the last 72 hours. D-Dimer No results for input(s): DDIMER in the last 72 hours. Hemoglobin A1C No results for input(s): HGBA1C in the last 72 hours. Fasting Lipid Panel No results for input(s): CHOL, HDL, LDLCALC, TRIG, CHOLHDL, LDLDIRECT in the last 72 hours. Thyroid Function Tests No results for input(s): TSH, T4TOTAL, T3FREE, THYROIDAB in the last 72 hours.  Invalid input(s): FREET3  Telemetry    nsr  ECG       Radiology    CT HEAD WO CONTRAST  Result Date: 07/30/2020 CLINICAL DATA:  Status post fall last night. EXAM: CT HEAD WITHOUT CONTRAST TECHNIQUE: Contiguous axial images were obtained from the base of the skull through the vertex without intravenous contrast. COMPARISON:  Head CT scan 22-Jul-2020 FINDINGS: Brain: No evidence of acute infarction,  hemorrhage, hydrocephalus, extra-axial collection or mass lesion/mass effect. 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 Vascular: No hyperdense vessel or unexpected calcification. Skull: Intact.  No focal lesion. Sinuses/Orbits: Mucosal thickening and scattered ethmoid air cells has progressed. The right sphenoid sinus is now almost completely opacified and there is some mucosal thickening in the left sphenoid sinus which is new since the prior exam. Small focus of mucosal thickening in the right maxillary sinus is also new. Orbits are negative. Other: None. IMPRESSION: No acute intracranial normality. Atrophy and chronic microvascular ischemic  change. Worsened sinus disease since the prior examination. The right sphenoid sinus is now almost completely opacified. Electronically Signed   By: Drusilla Kannerhomas  Dalessio M.D.   On: 07/30/2020 09:30   CT Head Wo Contrast  Result Date: 07/18/2020 CLINICAL DATA:  Larey SeatFell yesterday, found down after 12 hours, alcohol abuse EXAM: CT HEAD WITHOUT CONTRAST TECHNIQUE: Contiguous axial images were obtained from the base of the skull through the vertex without intravenous contrast. COMPARISON:  12/05/2019, 12/12/2019 FINDINGS: Brain: Confluent hypodensities within the periventricular white matter are compatible with chronic small vessel ischemic changes, stable since previous exam. Focal hypodensity within the left thalamus is new since previous study, with an appearance suggesting a chronic lacunar infarct. No evidence of acute infarct or hemorrhage. Stable diffuse cerebral atrophy. The lateral ventricles and midline structures are unremarkable. No acute extra-axial fluid collections. No mass effect. Vascular: No hyperdense vessel or unexpected calcification. Skull: Normal. Negative for fracture or focal lesion. Sinuses/Orbits: Kozel thickening within the bilateral ethmoid air cells. Remaining sinuses are clear. Other: None. IMPRESSION: 1. No acute intracranial process. 2. Stable chronic small vessel ischemic changes and cerebral atrophy. 3. Chronic appearing left thalamic lacunar infarct, new since previous study. Electronically Signed   By: Sharlet SalinaMichael  Brown M.D.   On: 2021-02-04 21:59   CT ANGIO CHEST PE W OR WO CONTRAST  Result Date: 07/19/2020 CLINICAL DATA:  65 year old male with positive D-dimer. Concern for pulmonary embolism. EXAM: CT ANGIOGRAPHY CHEST WITH CONTRAST TECHNIQUE: Multidetector CT imaging of the chest was performed using the standard protocol during bolus administration of intravenous contrast. Multiplanar CT image reconstructions and MIPs were obtained to evaluate the vascular anatomy. CONTRAST:  75mL  OMNIPAQUE IOHEXOL 350 MG/ML SOLN COMPARISON:  Chest radiograph dated 2021-02-04 FINDINGS: Cardiovascular: There is no cardiomegaly or pericardial effusion. The ascending thoracic aorta is dilated measuring up to 4.7 cm in diameter (coronal 32/13). Evaluation of the pulmonary arteries is limited due to respiratory motion artifact. No pulmonary artery embolus identified. Mediastinum/Nodes: No hilar or mediastinal adenopathy. The esophagus is grossly unremarkable. No mediastinal fluid collection. Lungs/Pleura: Small bilateral pleural effusions. Bilateral lower lobe subsegmental and subpleural consolidation may represent atelectasis or pneumonia. There are however clusters of nodular density in the lower lobes bilaterally concerning for pneumonia. Aspiration is not excluded. Clinical correlation is recommended. There is background of centrilobular emphysema. There is no pneumothorax. Mucus secretions noted in the trachea and bilateral mainstem bronchus. There is mucous impaction in the left lower lobe and to a lesser degree right lower lobe bronchi. Upper Abdomen: No acute abnormality. Musculoskeletal: Degenerative changes of the spine. No acute osseous pathology. Review of the MIP images confirms the above findings. IMPRESSION: 1. No CT evidence of pulmonary embolism. 2. Small bilateral pleural effusions. 3. Bilateral lower lobe bronchi mucous impaction with bilateral lower lobes clusters of nodularity concerning for pneumonia versus aspiration. 4. aortic Atherosclerosis (ICD10-I70.0) and Emphysema (ICD10-J43.9). Electronically Signed  By: Elgie Collard M.D.   On: 07/19/2020 16:10   DG Chest Portable 1 View  Result Date: 08/09/2020 CLINICAL DATA:  Cough. EXAM: PORTABLE CHEST 1 VIEW COMPARISON:  12/08/2019 prior radiographs FINDINGS: The cardiomediastinal silhouette is unchanged. Chronic interstitial opacities are noted. Slightly increased RIGHT basilar opacity may represent atelectasis or airspace disease. No  pleural effusion or pneumothorax identified. No acute bony abnormalities are noted. IMPRESSION: 1. Slightly increased RIGHT basilar opacity -question atelectasis or airspace disease. 2. Chronic interstitial opacities. Electronically Signed   By: Harmon Pier M.D.   On: 07/19/2020 18:03   ECHOCARDIOGRAM COMPLETE  Result Date: 07/30/2020    ECHOCARDIOGRAM REPORT   Patient Name:   William [name] Date of Exam: 07/28/2020 Medical Rec #:  [id number]    Height:       73.0 in Accession #:    [id number]   Weight:       176.4 lb Date of Birth:  1956/05/31     BSA:          2.040 m Patient Age:    64 years     BP:           115/80 mmHg Patient Gender: M            HR:           81 bpm. Exam Location:  [facility] Procedure: 2D Echo, Cardiac Doppler and Color Doppler Indications:     Atrial Fibrillation I48.91  History:         Patient has no prior history of Echocardiogram examinations.  Sonographer:     Neysa Bonito Roar Referring Phys:  [id number] Darlin Priestly Verdis Koval Diagnosing Phys: Alwyn Pea MD IMPRESSIONS  1. Ant/Lateral/septal mid hypo.  2. Mildly depressed LVF.  3. Mid septal mid ant/Lateral Hypo. Left ventricular ejection fraction, by estimation, is 40 to 45%. The left ventricle has mildly decreased function. The left ventricle demonstrates regional wall motion abnormalities (see scoring diagram/findings for description). The left ventricular internal cavity size was mildly to moderately dilated. Left ventricular diastolic parameters are consistent with Grade III diastolic dysfunction (restrictive).  4. Right ventricular systolic function is low normal. The right ventricular size is normal.  5. The mitral valve is normal in structure. No evidence of mitral valve regurgitation.  6. The aortic valve is grossly normal. Aortic valve regurgitation is trivial. FINDINGS  Left Ventricle: Mid septal mid ant/Lateral Hypo. Left ventricular ejection fraction, by estimation, is 40 to 45%. The left ventricle has mildly decreased function. The left  ventricle demonstrates regional wall motion abnormalities. The left ventricular internal cavity size was mildly to moderately dilated. There is borderline left ventricular hypertrophy. Left ventricular diastolic parameters are consistent with Grade III diastolic dysfunction (restrictive). Right Ventricle: The right ventricular size is normal. No increase in right ventricular wall thickness. Right ventricular systolic function is low normal. Left Atrium: Left atrial size was normal in size. Right Atrium: Right atrial size was normal in size. Pericardium: Trivial pericardial effusion is present. Mitral Valve: The mitral valve is normal in structure. No evidence of mitral valve regurgitation. Tricuspid Valve: The tricuspid valve is normal in structure. Tricuspid valve regurgitation is mild. Aortic Valve: The aortic valve is grossly normal. Aortic valve regurgitation is trivial. Aortic valve peak gradient measures 6.0 mmHg. Pulmonic Valve: The pulmonic valve was grossly normal. Pulmonic valve regurgitation is not visualized. Aorta: The ascending aorta was not well visualized. IAS/Shunts: No atrial level shunt detected by color flow Doppler. Additional Comments: Mildly depressed LVF.  Ant/Lateral/septal mid hypo. A pacer wire is visualized.  LEFT VENTRICLE PLAX 2D LVIDd:         4.90 cm  Diastology LVIDs:         3.90 cm  LV e' medial:    6.96 cm/s LV PW:         1.00 cm  LV E/e' medial:  3.6 LV IVS:        1.30 cm  LV e' lateral:   4.90 cm/s LVOT diam:     2.10 cm  LV E/e' lateral: 5.2 LVOT Area:     3.46 cm  RIGHT VENTRICLE RV Mid diam:    3.30 cm RV S prime:     14.70 cm/s TAPSE (M-mode): 2.2 cm LEFT ATRIUM             Index       RIGHT ATRIUM           Index LA diam:        3.80 cm 1.86 cm/m  RA Area:     21.60 cm LA Vol (A2C):   64.2 ml 31.48 ml/m RA Volume:   53.50 ml  26.23 ml/m LA Vol (A4C):   64.1 ml 31.43 ml/m LA Biplane Vol: 64.3 ml 31.52 ml/m  AORTIC VALVE                PULMONIC VALVE AV Area (Vmax):  2.31 cm    PV Vmax:        0.75 m/s AV Vmax:        122.00 cm/s PV Peak grad:   2.2 mmHg AV Peak Grad:   6.0 mmHg    RVOT Peak grad: 1 mmHg LVOT Vmax:      81.50 cm/s  AORTA Ao Root diam: 4.00 cm MITRAL VALVE MV Area (PHT): 3.68 cm    SHUNTS MV Decel Time: 206 msec    Systemic Diam: 2.10 cm MV E velocity: 25.30 cm/s MV A velocity: 69.40 cm/s MV E/A ratio:  0.36 MV A Prime:    9.8 cm/s Dwayne Salome Arnt MD Electronically signed by Alwyn Pea MD Signature Date/Time: 07/30/2020/8:17:11 AM    Final     Assessment & Plan      65 year old male with history of ethanol use admitted with history of a fall. Has a history of of drinking approximately 18 beers a day. Was then probable sinus tachycardia on admission. Subsequent EKG showed sinus rhythm now in atrial fibrillation with somewhat rapid ventricular response. Currently on amiodarone at200 twice daily. Not an ideal candidate for chronic anticoagulation due to ethanol abuse..  1. Atrial fibrillation-appears to have converted to sinus rhythm. We will continue with Toprol all and decrease amiodarone to 200 mg twice daily. EF 40-45%. Will continue with amiodarone.  Not a candidate for chronic anticoagulation at present given heavy ethanol use. We will discuss this as an outpatient if he is compliant and limits alcohol.  2. Alcohol abuse-cessation  3. COVID-continue current therapy.   Signed, Darlin Priestly Lisaanne Lawrie MD 07/31/2020, 7:57 AM  Pager: [phone]

## 2020-07-31 NOTE — Progress Notes (Signed)
OT Cancellation Note  Patient Details Name: Moss Berry MRN: [id number] DOB: 1956-01-06   Cancelled Treatment:    Reason Eval/Treat Not Completed: Patient declined, no reason specified. Upon arrival to room, pt awake supine in bed with mittens. Pt declined OT tx, appearing agitated and stating "why do you all keep harassing me?". OT will attempt to follow-up at later date.   Matthew Folks, OTR/L [facility] [phone]

## 2020-08-01 DIAGNOSIS — R627 Adult failure to thrive: Secondary | ICD-10-CM

## 2020-08-01 NOTE — Progress Notes (Signed)
Daily Progress Note   Patient Name: William [name]       Date: 08/01/2020 DOB: 1955-10-08  Age: 65 y.o. MRN#: [id number] Attending Physician: Delfino Lovett, MD Primary Care Physician: [facility] Admit Date: 07/27/2020  Reason for Consultation/Follow-up: Establishing goals of care  Subjective: Patient wakes to voice. He is oriented to person other disoriented and uncooperative. Per nursing staff, he has been confused today with very poor oral intake and difficulty with medication administration.   Patient denies pain during visit and does not appear to be in distress or discomfort.   GOC:  Spoke with sister, William Braun via telephone to discuss goals of care. William Braun has a good understanding of her brother's condition and prognosis. She shares her understanding when admitted that he may not make it through this hospitalization and his prognosis is "not good." William Braun understands his oral intake has been poor and intermittently uncooperative. We discussed challenges with aggressive physical therapy because of his acute and chronic conditions.   Discussed consideration of hospice services and trying to get him into [place] hosp[facility]. Or if further decline through the weekend, we could pursue hospice facility in [place]. William Braun agrees with hospice on discharge and shares her belief that he is "suffering." William Braun and other family members would wish to see him if possible once transitioned to hospice.   Reassured of ongoing support from PMT when provider back on service on Monday. Answered questions. Support provided.   Length of Stay: 14  Current Medications: Scheduled Meds:   amiodarone  200 mg Oral BID   chlordiazePOXIDE  10 mg Oral TID   enoxaparin (LOVENOX) injection  40 mg Subcutaneous Q24H    folic acid  1 mg Intravenous Daily   LORazepam  2 mg Intravenous Once   thiamine injection  100 mg Intravenous Daily    Continuous Infusions:  sodium chloride Stopped (07/27/20 0617)    PRN Meds: sodium chloride, acetaminophen **OR** acetaminophen, Ipratropium-Albuterol, LORazepam, ondansetron **OR** ondansetron (ZOFRAN) IV  Physical Exam Vitals and nursing note reviewed.  Constitutional:      Appearance: He is ill-appearing.  HENT:     Head: Normocephalic and atraumatic.  Pulmonary:     Effort: No tachypnea, accessory muscle usage or respiratory distress.     Comments: Niland Skin:    General: Skin is warm and dry.  Neurological:     Mental Status: He is easily aroused.     Comments: Oriented to self, otherwise disoriented.  Psychiatric:        Speech: Speech is delayed.     Comments: irritable            Vital Signs: BP (!) 87/59 (BP Location: Left Arm)    Pulse 99    Temp 98.4 F (36.9 C)    Resp 18    Ht 6\' 1"  (1.854 m)    Wt 80 kg    SpO2 (!) 77%    BMI 23.27 kg/m  SpO2: SpO2: (!) 77 % O2 Device: O2 Device: Nasal Cannula O2 Flow Rate: O2 Flow Rate (L/min): 4 L/min  Intake/output summary:   Intake/Output Summary (Last 24 hours) at 08/01/2020 1627 Last data filed at 08/01/2020 1200 Gross per 24 hour  Intake 160 ml  Output --  Net 160 ml   LBM: Last BM Date: 07/24/20 (per chart) Baseline Weight: Weight: 80 kg Most recent weight: Weight: 80 kg       Palliative Assessment/Data: PPS 30%    Flowsheet Rows   Flowsheet Row Most Recent Value  Intake Tab   Referral Department Hospitalist  Unit at Time of Referral Med/Surg Unit  Date Notified 07/21/20  Palliative Care Type New Palliative care  Reason for referral Clarify Goals of Care  Date of Admission 07/29/2020  Date first seen by Palliative Care 07/22/20  # of days Palliative referral response time 1 Day(s)  # of days IP prior to Palliative referral 4  Clinical Assessment   Psychosocial & Spiritual  Assessment   Palliative Care Outcomes       Patient Active Problem List   Diagnosis Date Noted   Pneumonia due to COVID-19 virus 07/18/2020   Severe sepsis (HCC)    Dysphagia    Hyponatremia    DNR (do not resuscitate)    Alcohol withdrawal (HCC) 07/16/2020   Leukocytopenia 07/20/2020   CAP (community acquired pneumonia) 07/20/2020   Tachycardia    Hypokalemia    Hepatic steatosis    Acute respiratory failure with hypoxia (HCC)    COPD with acute exacerbation (HCC)    Acute metabolic encephalopathy 12/06/2019   Wernicke's encephalopathy    Wheeze    Alcohol abuse    Tobacco abuse    Thrombocytopenia (HCC)    Weakness     Palliative Care Assessment & Plan   Patient Profile: 65 y.o. male  with past medical history of Wernicke's encephalopathy, hypertension, ETOH and tobacco abuse admitted on 07/31/2020 after unwitnessed fall, found down by sister. Severe sepsis on admission due to aspiration pneumonia, COVID-19 pneumonia, acute metabolic encephalopathy with hx of ETOH abuse. CT head negative. Ongoing dysphagia. SLP recommending NPO status. Known to SLP from previous admission 02/07/20 when patient demonstrated severe oropharyngeal dysphagia with frank aspiration of all consistencies. Patient reportedly discharged on dysphagia 3, thin liquid diet. Palliative medicine consultation for goals of care.    Assessment: Severe sepsis Aspiration pneumonia Acute metabolic encephalopathy Dysphagia Aspiration risk ETOH abuse Hyponatremia Tobacco abuse Thrombocytopenia  Recommendations/Plan: Continue current plan of care and medical management. Documented HCPOA/sister, 02/09/20. Living will/HCPOA paperwork uploaded in EMR. This admission, sister has confirmed that William [name] would not want feeding tube/artificial nutrition. Dysphagia diet initiated. Aspiration precautions. Oral intake remains poor. F/u GOC discussion with sister on 1/21. Sister open to comfort  focused care plan and initiation of hospice services on discharge, understanding Vahan's condition and prognosis. Discussed with  TOC team. Could [facility] consider [facility]? If further decline through the weekend, [place] [facility] may accept.  PMT provider off service this weekend but will f/u on Monday 1/24.    Code Status: DNR   Code Status Orders  (From admission, onward)         Start     Ordered   07/18/20 1256  Do not attempt resuscitation (DNR)  Continuous       Question Answer Comment  In the event of cardiac or respiratory ARREST Do not call a code blue   In the event of cardiac or respiratory ARREST Do not perform Intubation, CPR, defibrillation or ACLS   In the event of cardiac or respiratory ARREST Use medication by any route, position, wound care, and other measures to relive pain and suffering. May use oxygen, suction and manual treatment of airway obstruction as needed for comfort.      07/18/20 1255        Code Status History    Date Active Date Inactive Code Status Order ID Comments User Context   08-12-20 2235 07/18/2020 1255 Full Code [id number]  Bobette Mo, MD ED   12/06/2019 1519 12/18/2019 2042 DNR [id number]  Alford Highland, MD ED   12/05/2019 1617 12/06/2019 1519 Full Code [id number]  Shaune Pollack, MD ED   11/15/2019 1454 11/17/2019 0858 Full Code [id number]  Lorre Nick, MD ED   Advance Care Planning Activity      Prognosis:  Poor long-term prognosis  Discharge Planning: To be determined: [facility] hospice facility?  Care plan was discussed with RN, Dr. Sherryll Burger, SW, sister/HCPOA William Braun)  Thank you for allowing the Palliative Medicine Team to assist in the care of this patient.   Total Time 30 Prolonged Time Billed  no    Greater than 50% of this time was spent counseling and coordinating care related to the above assessment and plan.   Vennie Homans, DNP, FNP-C Palliative Medicine Team  Phone: [phone] Fax:  [phone]  Please contact Palliative Medicine Team phone at [phone] for questions and concerns.

## 2020-08-01 NOTE — Progress Notes (Deleted)
Physical Therapy Treatment Patient Details Name: William [name] MRN: [id number] DOB: 1956/07/03 Today's Date: 08/01/2020    History of Present Illness William [name] is a 65 y.o. male with medical history significant of Wernicke encephalopathy, hypertension, alcohol and tobacco abuse who is brought to the emergency department via EMS 07/23/2020 after sustaining an unwitnessed fall at home last night with inability to stand up afterwards and remained on the floor for about 12 hours.    PT Comments    Pt seen this pm, received in bed, poor positioning with right lateral lean.  Pt assisted to upright position and boosted in bed with full assistance. Pillows placed to prevent leaning. Pt mumbling, difficult to understand however did understand pt's request for water. Bilateral hand mitts in place, therefore assisted pt.  Attempted B LE ROM, however pt resisted. Pt left in bed with all rails up, bed in lowest position, alarm on, and call bell in reach.   Follow Up Recommendations        Equipment Recommendations       Recommendations for Other Services       Precautions / Restrictions Precautions Precautions: Fall Precaution Comments: wounds, bleeding; B mitts Restrictions Weight Bearing Restrictions: No    Mobility  Bed Mobility Overal bed mobility: Needs Assistance             General bed mobility comments: Pt did not follow commands, required full assist to reposition in bed to maintain better posture and promote skin integrity.  Transfers                    Ambulation/Gait                 Stairs             Wheelchair Mobility    Modified Rankin (Stroke Patients Only)       Balance                                            Cognition Arousal/Alertness: Awake/alert Behavior During Therapy: Restless Overall Cognitive Status: No family/caregiver present to determine baseline cognitive functioning                                  General Comments:  (Pt mumbling, difficult to understand, did clearly ask for water.)      Exercises      General Comments        Pertinent Vitals/Pain      Home Living                      Prior Function            PT Goals (current goals can now be found in the care plan section)      Frequency           PT Plan      Co-evaluation              AM-PAC PT "6 Clicks" Mobility   Outcome Measure                   End of Session               Time: 1250-1300 PT Time Calculation (min) (ACUTE ONLY): 10 min  Charges:  $  Therapeutic Activity: 8-22 mins                     Zadie Cleverly, PTA   Jannet Askew 08/01/2020, 1:40 PM

## 2020-08-01 NOTE — Progress Notes (Signed)
Physical Therapy Treatment Patient Details Name: William [name] MRN: [id number] DOB: [birth year] Today's Date: 08/01/2020    History of Present Illness William [name] is a 65 y.o. male with medical history significant of Wernicke encephalopathy, hypertension, alcohol and tobacco abuse who is brought to the emergency department via EMS 07/22/2020 after sustaining an unwitnessed fall at home last night with inability to stand up afterwards and remained on the floor for about 12 hours.    PT Comments    Pt seen this pm, received in bed, poor positioning with right lateral lean.  Pt assisted to upright position and boosted in bed with full assistance. Pillows placed to prevent leaning. Pt mumbling, difficult to understand however did understand pt's request for water. Bilateral hand mitts in place, therefore assisted pt.  Attempted B LE ROM, however pt resisted. Pt left in bed with all rails up, bed in lowest position, alarm on, and call bell in reach.   Follow Up Recommendations  SNF     Equipment Recommendations  Rolling walker with 5" wheels;3in1 (PT)    Recommendations for Other Services       Precautions / Restrictions Precautions Precautions: Fall Precaution Comments: wounds, bleeding; B mitts Restrictions Weight Bearing Restrictions: No    Mobility  Bed Mobility Overal bed mobility: Needs Assistance             General bed mobility comments: Pt did not follow commands, required full assist to reposition in bed to maintain better posture and promote skin integrity.  Transfers                    Ambulation/Gait                 Stairs             Wheelchair Mobility    Modified Rankin (Stroke Patients Only)       Balance                                            Cognition Arousal/Alertness: Awake/alert Behavior During Therapy: Restless Overall Cognitive Status: No family/caregiver present to determine baseline cognitive  functioning                                 General Comments:  (Pt mumbling, difficult to understand, did clearly ask for water.)      Exercises      General Comments        Pertinent Vitals/Pain      Home Living                      Prior Function            PT Goals (current goals can now be found in the care plan section) Progress towards PT goals: Not progressing toward goals - comment    Frequency    Min 2X/week      PT Plan Current plan remains appropriate    Co-evaluation              AM-PAC PT "6 Clicks" Mobility   Outcome Measure  Help needed turning from your back to your side while in a flat bed without using bedrails?: Total Help needed moving from lying on your back to sitting on the side of a  flat bed without using bedrails?: Total Help needed moving to and from a bed to a chair (including a wheelchair)?: Total Help needed standing up from a chair using your arms (e.g., wheelchair or bedside chair)?: Total Help needed to walk in hospital room?: Total Help needed climbing 3-5 steps with a railing? : Total 6 Click Score: 6    End of Session         PT Visit Diagnosis: Difficulty in walking, not elsewhere classified (R26.2);Other abnormalities of gait and mobility (R26.89);Muscle weakness (generalized) (M62.81);Repeated falls (R29.6)     Time: 1250-1300 PT Time Calculation (min) (ACUTE ONLY): 10 min  Charges:  $Therapeutic Activity: 8-22 mins                     Zadie Cleverly, PTA   Jannet Askew 08/01/2020, 1:48 PM

## 2020-08-01 NOTE — TOC Progression Note (Addendum)
Transition of Care [facility]) - Progression Note    Patient Details  Name: William [name] MRN: [id number] Date of Birth: November 02, 1955  Transition of Care [facility]) CM/SW Contact  Liliana Cline, LCSW Phone Number: 08/01/2020, 10:29 AM  Clinical Narrative:   Called [facility] to inquire again if they are taking [facility] patients.  [facility], she reported they do take [facility] patients, but have no beds available. She reported CSW can call weekly about bed availability.  [facility]- Left a message with Receptionist for [facility]- Unable to reach anyone at facility by phone [place]- Left another voicemail for [employer]  Expected Discharge Plan: Skilled Nursing Facility Barriers to Discharge: Continued Medical Work up,Inadequate or no insurance  Expected Discharge Plan and Services Expected Discharge Plan: Skilled Nursing Facility     Post Acute Care Choice: Skilled Nursing Facility Living arrangements for the past 2 months: Mobile Home                                       Social Determinants of Health (SDOH) Interventions    Readmission Risk Interventions No flowsheet data found.

## 2020-08-01 NOTE — Progress Notes (Signed)
1        [place] at [facility]   PATIENT NAME: William [name]    MR#:  [id number]  DATE OF BIRTH:  [birth year]  SUBJECTIVE:  CHIEF COMPLAINT:  No chief complaint on file. awake, being fed by nurse tech at bedside.  Denies any other issues REVIEW OF SYSTEMS:  ROS unable to obtain due to his mental status DRUG ALLERGIES:  No Known Allergies VITALS:  Blood pressure (!) 87/59, pulse 99, temperature 98.4 F (36.9 C), resp. rate 18, height 6\' 1"  (1.854 m), weight 80 kg, SpO2 (!) 77 %. PHYSICAL EXAMINATION:  Physical Exam  General appears older than his stated age Respiratory decreased breath sounds bilaterally Cardiovascular S1-S2 normal, no murmur rubs or gallop CNS nonfocal, awake but not interested in talking Psychiatry flat mood and affect LABORATORY PANEL:  Male CBC Recent Labs  Lab 07/30/20 0755  WBC 5.5  HGB 13.4  HCT 38.8*  PLT 115*   ------------------------------------------------------------------------------------------------------------------ Chemistries  Recent Labs  Lab 07/26/20 0659 07/27/20 0621 07/30/20 0618  NA 145   < > 140  K 3.4*   < > 3.4*  CL 108   < > 102  CO2 29   < > 29  GLUCOSE 97   < > 83  BUN 13   < > 7*  CREATININE 0.39*   < > 0.47*  CALCIUM 7.9*   < > 8.3*  MG 2.2  --   --    < > = values in this interval not displayed.   RADIOLOGY:  No results found. ASSESSMENT AND PLAN:  65 year old male with a known history of alcohol and tobacco abuse, hypertension, Warnicke encephalopathy was admitted for sepsis due to pneumonia  Severe sepsis present on admission due to aspiration pneumonia and COVID-19 infection.  Now resolved Pneumonia secondary to COVID-19 and aspiration present on admission and now resolved.  Completed remdesivir, antibiotic and steroid courses. Acute hypoxic respiratory failure: Likely from aspiration events.  Still requiring 3 to 4 L oxygen by nasal cannula Acute metabolic encephalopathy from alcohol abuse  and history of Warnicke encephalopathy.  CT head is negative.  Patient had unwitnessed fall last night and repeat CT head is negative.  He has nonfocal examination Alcohol abuse and withdrawal: On CIWA protocol, taper librium as tolerated Paroxysmal A. Fib: Converted to normal sinus rhythm.  Continue amiodarone 200 mg p.o. twice daily.  Not a candidate for chronic anticoagulation considering falls and heavy alcohol use.  Cardiology following History of dysphagia and recurrent aspirations: Continue dysphagia 3 diet and aspiration precautions Hypokalemia/hyponatremia - repleted and resolved Acute on chronic thrombocytopenia: Likely from alcoholism  After discussion of palliative care with family decision is made to transition him to residential hospice on 1/21.  TOC team is aware and working on this  Body mass index is 23.27 kg/m.    Overall poor prognosis.  Consult palliative care  Status is: Inpatient  Remains inpatient appropriate because:Altered mental status   Dispo: The patient is from: Home              Anticipated d/c is to: SNF              Anticipated d/c date is: 2 days              Patient currently is not medically stable to d/c.  Waiting for residential hospice disposition at this point    DVT prophylaxis:       enoxaparin (LOVENOX) injection 40 mg Start:  07/19/20 1000 SCDs Start: 07/19/2020 2235     Family Communication: Updated patient's Sister William Braun over phone at [phone] on 1/19.  Palliative care discussed with family today on 1/21 and plan is for residential hospice   All the records are reviewed and case discussed with Care Management/Social Worker. Management plans discussed with the patient, nursing and they are in agreement.  CODE STATUS: DNR  TOTAL TIME TAKING CARE OF THIS PATIENT: 35 minutes.   More than 50% of the time was spent in counseling/coordination of care: YES  POSSIBLE D/C IN 2-3 DAYS, DEPENDING ON CLINICAL CONDITION.   Delfino Lovett M.D  on 08/01/2020 at 4:49 PM  [facility]   CC: Primary care physician; Clinic, [facility]  Note: This dictation was prepared with Dragon dictation along with smaller phrase technology. Any transcriptional errors that result from this process are unintentional.

## 2020-08-02 DIAGNOSIS — F10239 Alcohol dependence with withdrawal, unspecified: Secondary | ICD-10-CM

## 2020-08-02 LAB — BASIC METABOLIC PANEL
Anion gap: 12 (ref 5–15)
BUN: 17 mg/dL (ref 8–23)
CO2: 24 mmol/L (ref 22–32)
Calcium: 8.1 mg/dL — ABNORMAL LOW (ref 8.9–10.3)
Chloride: 105 mmol/L (ref 98–111)
Creatinine, Ser: 0.48 mg/dL — ABNORMAL LOW (ref 0.61–1.24)
GFR, Estimated: >60 mL/min (ref >60)
Glucose, Bld: 69 mg/dL — ABNORMAL LOW (ref 70–99)
Potassium: 2.8 mmol/L — ABNORMAL LOW (ref 3.5–5.1)
Sodium: 141 mmol/L (ref 135–145)

## 2020-08-02 MED ORDER — NYSTATIN 100000 UNIT/ML MT SUSP
5.0000 mL | Freq: Four times a day (QID) | OROMUCOSAL | Status: DC
  Administered 2020-08-02: 14:00:00 500000 [IU] via OROMUCOSAL
  Filled 2020-08-02: qty 5

## 2020-08-02 MED ORDER — GLYCOPYRROLATE 0.2 MG/ML IJ SOLN: 0.2000 mg | INTRAMUSCULAR | Status: DC | PRN

## 2020-08-02 MED ORDER — HALOPERIDOL LACTATE 5 MG/ML IJ SOLN: 2.5000 mg | INTRAMUSCULAR | Status: DC | PRN

## 2020-08-02 MED ORDER — ACETAMINOPHEN 650 MG RE SUPP: 650.0000 mg | Freq: Four times a day (QID) | RECTAL | Status: DC | PRN

## 2020-08-02 MED ORDER — ACETAMINOPHEN 325 MG PO TABS: 650.0000 mg | ORAL_TABLET | Freq: Four times a day (QID) | ORAL | Status: DC | PRN

## 2020-08-02 MED ORDER — MORPHINE SULFATE (PF) 2 MG/ML IV SOLN
2.0000 mg | INTRAVENOUS | Status: DC | PRN
  Administered 2020-08-02 (×2): 2 mg via INTRAVENOUS
  Filled 2020-08-02 (×2): qty 1

## 2020-08-02 MED ORDER — POLYVINYL ALCOHOL 1.4 % OP SOLN
1.0000 [drp] | Freq: Four times a day (QID) | OPHTHALMIC | Status: DC | PRN
  Filled 2020-08-02: qty 15

## 2020-08-02 MED ORDER — GLYCOPYRROLATE 1 MG PO TABS
1.0000 mg | ORAL_TABLET | ORAL | Status: DC | PRN
  Filled 2020-08-02: qty 1

## 2020-08-02 MED ORDER — MORPHINE SULFATE (CONCENTRATE) 10 MG/0.5ML PO SOLN
10.0000 mg | ORAL | Status: DC | PRN
  Administered 2020-08-02: 10 mg via SUBLINGUAL
  Filled 2020-08-02: qty 0.5

## 2020-08-02 MED ORDER — DIPHENHYDRAMINE HCL 50 MG/ML IJ SOLN: 25.0000 mg | INTRAMUSCULAR | Status: DC | PRN

## 2020-08-12 NOTE — Progress Notes (Signed)
IV team entered room to obtain access and immediately returned to nurses' station to report patient was no longer breathing. Patient assessed by Algis Downs, RN and Trish Mage, RN and found to have no apical pulse and no spontaneous respirations. MD made aware.

## 2020-08-12 NOTE — Progress Notes (Signed)
1        [place] at [facility]   PATIENT NAME: William [name]    MR#:  [id number]  DATE OF BIRTH:  Dec 03, 1955  SUBJECTIVE:  CHIEF COMPLAINT:  No chief complaint on file. Actively dying.  Seems comfortable REVIEW OF SYSTEMS:  ROS unable to obtain due to his mental status DRUG ALLERGIES:  No Known Allergies VITALS:  Blood pressure 95/73, pulse 93, temperature 97.6 F (36.4 C), resp. rate 15, height 6\' 1"  (1.854 m), weight 80 kg, SpO2 90 %. PHYSICAL EXAMINATION:  Physical Exam  General appears older than his stated age.  Patient actively dying Respiratory -Cheyne-Stokes respiration Cardiovascular S1-S2 normal, no murmur rubs or gallop CNS nonfocal,  Psychiatry unable to assess LABORATORY PANEL:  Male CBC Recent Labs  Lab 07/30/20 0755  WBC 5.5  HGB 13.4  HCT 38.8*  PLT 115*   ------------------------------------------------------------------------------------------------------------------ Chemistries  Recent Labs  Lab 07/16/2020 0614  NA 141  K 2.8*  CL 105  CO2 24  GLUCOSE 69*  BUN 17  CREATININE 0.48*  CALCIUM 8.1*   RADIOLOGY:  No results found. ASSESSMENT AND PLAN:  65 year old male with a known history of alcohol and tobacco abuse, hypertension, Warnicke encephalopathy was admitted for sepsis due to pneumonia  Severe sepsis present on admission due to aspiration pneumonia and COVID-19 infection.  Pneumonia secondary to COVID-19 and aspiration present on admission Acute hypoxic respiratory failure: Likely from aspiration events. Acute metabolic encephalopathy from alcohol abuse and history of Warnicke encephalopathy.   Alcohol abuse and withdrawal Paroxysmal A. Fib:  History of dysphagia and recurrent aspirations:  Hypokalemia/hyponatremia  Acute on chronic thrombocytopenia: Active dying process  Patient is full comfort care and seem to be actively dying.  Waiting for hospice home bed at this time  Body mass index is 23.27 kg/m.     Overall poor prognosis.   Status is: Inpatient  Remains inpatient appropriate because:Altered mental status   Dispo: The patient is from: Home              Anticipated d/c is to: SNF              Anticipated d/c date is: 2 days              Patient currently is not medically stable to d/c.  Waiting for residential hospice disposition at this point    DVT prophylaxis:       SCDs Start: August 05, 2020 2235     Family Communication: Updated patient's Sister 2236 over phone at [phone] on 1/19.  Palliative care discussed with family today on 1/21 and plan is for residential hospice   All the records are reviewed and case discussed with Care Management/Social Worker. Management plans discussed with the patient, nursing and they are in agreement.  CODE STATUS: DNR  TOTAL TIME TAKING CARE OF THIS PATIENT: 15 minutes.   More than 50% of the time was spent in counseling/coordination of care: YES  POSSIBLE D/C IN 1-2 DAYS, DEPENDING ON CLINICAL CONDITION.   2/21 M.D on 08/06/2020 at 12:34 PM  [facility]   CC: Primary care physician; Clinic, 08/04/2020  Note: This dictation was prepared with Dragon dictation along with smaller phrase technology. Any transcriptional errors that result from this process are unintentional.

## 2020-08-12 NOTE — Progress Notes (Signed)
Attempt made to update patient's sister via phone. No answer. Message left.

## 2020-08-12 NOTE — Hospital Course (Signed)
Admitted for alcohol withdrawal and sepsis due to pneumonia.  1/21 -palliative care meeting with family.  Comfort care.  Waiting for residential hospice bed at this time. 1/22 -comfort care only.  Waiting for hospice home bed.  May die in the hospital.

## 2020-08-12 NOTE — Death Summary Note (Addendum)
DEATH SUMMARY   Patient Details  Name: William [name] MRN: [id number] DOB: 1956/04/05  Admission/Discharge Information   Admit Date:  August 07, 2020  Date of Death:   08-23-20  Time of Death:  1655  Length of Stay: 11-15-22  Referring Physician: [facility], [facility]   Reason(s) for Hospitalization  fall Diagnoses  Preliminary cause of death: severe sepsis due to aspiration pneumonia and COVID-19 infection Secondary Diagnoses (including complications and co-morbidities):  Principal Problem:   Alcohol withdrawal (HCC) Active Problems:   Tobacco abuse   Thrombocytopenia (HCC)   Leukocytopenia   CAP (community acquired pneumonia)   Pneumonia due to COVID-19 virus  Brief Hospital Course (including significant findings, care, treatment, and services provided and events leading to death)  William [name] is a 65 y.o. year old male with a known history of alcohol and tobacco abuse, hypertension, Warnicke encephalopathy was admitted for sepsis due to pneumonia  Severe sepsis present on admission due to aspiration pneumonia and COVID-19 infection.  Pneumonia secondary to COVID-19 and aspiration present on admission Acute hypoxic respiratory failure: Likely from aspiration events. Acute metabolic encephalopathy from alcohol abuse and history of Warnicke encephalopathy.   Alcohol abuse and withdrawal Paroxysmal A. Fib:  History of dysphagia and recurrent aspirations:  Hypokalemia/hyponatremia  Acute on chronic thrombocytopenia: Active dying process  Patient was made comfort care after Palliative care d/w family on 1/21 and was waiting for hospice home bed. He was actively dying and was expected to die in the hospital. Pertinent Labs and Studies  Significant Diagnostic Studies CT HEAD WO CONTRAST  Result Date: 07/30/2020 CLINICAL DATA:  Status post fall last night. EXAM: CT HEAD WITHOUT CONTRAST TECHNIQUE: Contiguous axial images were obtained from the base of the skull through the vertex  without intravenous contrast. COMPARISON:  Head CT scan 08-07-20 FINDINGS: Brain: No evidence of acute infarction, hemorrhage, hydrocephalus, extra-axial collection or mass lesion/mass effect. 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 Vascular: No hyperdense vessel or unexpected calcification. Skull: Intact.  No focal lesion. Sinuses/Orbits: Mucosal thickening and scattered ethmoid air cells has progressed. The right sphenoid sinus is now almost completely opacified and there is some mucosal thickening in the left sphenoid sinus which is new since the prior exam. Small focus of mucosal thickening in the right maxillary sinus is also new. Orbits are negative. Other: None. IMPRESSION: No acute intracranial normality. Atrophy and chronic microvascular ischemic change. Worsened sinus disease since the prior examination. The right sphenoid sinus is now almost completely opacified. Electronically Signed   By: Drusilla Kanner M.D.   On: 07/30/2020 09:30   CT Head Wo Contrast  Result Date: 2020-08-07 CLINICAL DATA:  Larey Seat yesterday, found down after 12 hours, alcohol abuse EXAM: CT HEAD WITHOUT CONTRAST TECHNIQUE: Contiguous axial images were obtained from the base of the skull through the vertex without intravenous contrast. COMPARISON:  12/05/2019, 12/12/2019 FINDINGS: Brain: Confluent hypodensities within the periventricular white matter are compatible with chronic small vessel ischemic changes, stable since previous exam. Focal hypodensity within the left thalamus is new since previous study, with an appearance suggesting a chronic lacunar infarct. No evidence of acute infarct or hemorrhage. Stable diffuse cerebral atrophy. The lateral ventricles and midline structures are unremarkable. No acute extra-axial fluid collections. No mass effect. Vascular: No hyperdense vessel or unexpected calcification. Skull: Normal. Negative for fracture or  focal lesion. Sinuses/Orbits: Kozel thickening within the bilateral ethmoid air cells. Remaining sinuses are clear. Other: None. IMPRESSION: 1. No acute  intracranial process. 2. Stable chronic small vessel ischemic changes and cerebral atrophy. 3. Chronic appearing left thalamic lacunar infarct, new since previous study. Electronically Signed   By: Sharlet Salina M.D.   On: 2020/07/31 21:59   CT ANGIO CHEST PE W OR WO CONTRAST  Result Date: 07/19/2020 CLINICAL DATA:  65 year old male with positive D-dimer. Concern for pulmonary embolism. EXAM: CT ANGIOGRAPHY CHEST WITH CONTRAST TECHNIQUE: Multidetector CT imaging of the chest was performed using the standard protocol during bolus administration of intravenous contrast. Multiplanar CT image reconstructions and MIPs were obtained to evaluate the vascular anatomy. CONTRAST:  31mL OMNIPAQUE IOHEXOL 350 MG/ML SOLN COMPARISON:  Chest radiograph dated 07-31-2020 FINDINGS: Cardiovascular: There is no cardiomegaly or pericardial effusion. The ascending thoracic aorta is dilated measuring up to 4.7 cm in diameter (coronal 32/13). Evaluation of the pulmonary arteries is limited due to respiratory motion artifact. No pulmonary artery embolus identified. Mediastinum/Nodes: No hilar or mediastinal adenopathy. The esophagus is grossly unremarkable. No mediastinal fluid collection. Lungs/Pleura: Small bilateral pleural effusions. Bilateral lower lobe subsegmental and subpleural consolidation may represent atelectasis or pneumonia. There are however clusters of nodular density in the lower lobes bilaterally concerning for pneumonia. Aspiration is not excluded. Clinical correlation is recommended. There is background of centrilobular emphysema. There is no pneumothorax. Mucus secretions noted in the trachea and bilateral mainstem bronchus. There is mucous impaction in the left lower lobe and to a lesser degree right lower lobe bronchi. Upper Abdomen: No acute abnormality.  Musculoskeletal: Degenerative changes of the spine. No acute osseous pathology. Review of the MIP images confirms the above findings. IMPRESSION: 1. No CT evidence of pulmonary embolism. 2. Small bilateral pleural effusions. 3. Bilateral lower lobe bronchi mucous impaction with bilateral lower lobes clusters of nodularity concerning for pneumonia versus aspiration. 4. aortic Atherosclerosis (ICD10-I70.0) and Emphysema (ICD10-J43.9). Electronically Signed   By: Elgie Collard M.D.   On: 07/19/2020 16:10   DG Chest Portable 1 View  Result Date: 31-Jul-2020 CLINICAL DATA:  Cough. EXAM: PORTABLE CHEST 1 VIEW COMPARISON:  12/08/2019 prior radiographs FINDINGS: The cardiomediastinal silhouette is unchanged. Chronic interstitial opacities are noted. Slightly increased RIGHT basilar opacity may represent atelectasis or airspace disease. No pleural effusion or pneumothorax identified. No acute bony abnormalities are noted. IMPRESSION: 1. Slightly increased RIGHT basilar opacity -question atelectasis or airspace disease. 2. Chronic interstitial opacities. Electronically Signed   By: Harmon Pier M.D.   On: 07-31-2020 18:03   ECHOCARDIOGRAM COMPLETE  Result Date: 07/30/2020    ECHOCARDIOGRAM REPORT   Patient Name:   TALLEY KREISER Date of Exam: 07/28/2020 Medical Rec #:  [id number]    Height:       73.0 in Accession #:    [id number]   Weight:       176.4 lb Date of Birth:  12-Nov-1955     BSA:          2.040 m Patient Age:    64 years     BP:           115/80 mmHg Patient Gender: M            HR:           81 bpm. Exam Location:  [facility] Procedure: 2D Echo, Cardiac Doppler and Color Doppler Indications:     Atrial Fibrillation I48.91  History:         Patient has no prior history of Echocardiogram examinations.  Sonographer:     Neysa Bonito Roar Referring Phys:  [id number] Darlin Priestly FATH  Diagnosing Phys: Alwyn Peawayne D Callwood MD IMPRESSIONS  1. Ant/Lateral/septal mid hypo.  2. Mildly depressed LVF.  3. Mid septal mid ant/Lateral Hypo. Left  ventricular ejection fraction, by estimation, is 40 to 45%. The left ventricle has mildly decreased function. The left ventricle demonstrates regional wall motion abnormalities (see scoring diagram/findings for description). The left ventricular internal cavity size was mildly to moderately dilated. Left ventricular diastolic parameters are consistent with Grade III diastolic dysfunction (restrictive).  4. Right ventricular systolic function is low normal. The right ventricular size is normal.  5. The mitral valve is normal in structure. No evidence of mitral valve regurgitation.  6. The aortic valve is grossly normal. Aortic valve regurgitation is trivial. FINDINGS  Left Ventricle: Mid septal mid ant/Lateral Hypo. Left ventricular ejection fraction, by estimation, is 40 to 45%. The left ventricle has mildly decreased function. The left ventricle demonstrates regional wall motion abnormalities. The left ventricular internal cavity size was mildly to moderately dilated. There is borderline left ventricular hypertrophy. Left ventricular diastolic parameters are consistent with Grade III diastolic dysfunction (restrictive). Right Ventricle: The right ventricular size is normal. No increase in right ventricular wall thickness. Right ventricular systolic function is low normal. Left Atrium: Left atrial size was normal in size. Right Atrium: Right atrial size was normal in size. Pericardium: Trivial pericardial effusion is present. Mitral Valve: The mitral valve is normal in structure. No evidence of mitral valve regurgitation. Tricuspid Valve: The tricuspid valve is normal in structure. Tricuspid valve regurgitation is mild. Aortic Valve: The aortic valve is grossly normal. Aortic valve regurgitation is trivial. Aortic valve peak gradient measures 6.0 mmHg. Pulmonic Valve: The pulmonic valve was grossly normal. Pulmonic valve regurgitation is not visualized. Aorta: The ascending aorta was not well visualized. IAS/Shunts:  No atrial level shunt detected by color flow Doppler. Additional Comments: Mildly depressed LVF. Ant/Lateral/septal mid hypo. A pacer wire is visualized.  LEFT VENTRICLE PLAX 2D LVIDd:         4.90 cm  Diastology LVIDs:         3.90 cm  LV e' medial:    6.96 cm/s LV PW:         1.00 cm  LV E/e' medial:  3.6 LV IVS:        1.30 cm  LV e' lateral:   4.90 cm/s LVOT diam:     2.10 cm  LV E/e' lateral: 5.2 LVOT Area:     3.46 cm  RIGHT VENTRICLE RV Mid diam:    3.30 cm RV S prime:     14.70 cm/s TAPSE (M-mode): 2.2 cm LEFT ATRIUM             Index       RIGHT ATRIUM           Index LA diam:        3.80 cm 1.86 cm/m  RA Area:     21.60 cm LA Vol (A2C):   64.2 ml 31.48 ml/m RA Volume:   53.50 ml  26.23 ml/m LA Vol (A4C):   64.1 ml 31.43 ml/m LA Biplane Vol: 64.3 ml 31.52 ml/m  AORTIC VALVE                PULMONIC VALVE AV Area (Vmax): 2.31 cm    PV Vmax:        0.75 m/s AV Vmax:        122.00 cm/s PV Peak grad:   2.2 mmHg AV Peak Grad:   6.0 mmHg  RVOT Peak grad: 1 mmHg LVOT Vmax:      81.50 cm/s  AORTA Ao Root diam: 4.00 cm MITRAL VALVE MV Area (PHT): 3.68 cm    SHUNTS MV Decel Time: 206 msec    Systemic Diam: 2.10 cm MV E velocity: 25.30 cm/s MV A velocity: 69.40 cm/s MV E/A ratio:  0.36 MV A Prime:    9.8 cm/s Dwayne Salome Arnt MD Electronically signed by Alwyn Pea MD Signature Date/Time: 07/30/2020/8:17:11 AM    Final     Microbiology No results found for this or any previous visit (from the past 240 hour(s)).  Lab Basic Metabolic Panel: Recent Labs  Lab 07/27/20 0621 07/28/20 0758 07/29/20 0604 07/30/20 0618 08-25-20 0614  NA 146* 143 142 140 141  K 3.4* 3.5 3.5 3.4* 2.8*  CL 107 106 106 102 105  CO2 30 30 28 29 24   GLUCOSE 88 88 93 83 69*  BUN 11 9 8  7* 17  CREATININE 0.40* 0.44* 0.43* 0.47* 0.48*  CALCIUM 8.3* 8.2* 8.1* 8.3* 8.1*   Liver Function Tests: No results for input(s): AST, ALT, ALKPHOS, BILITOT, PROT, ALBUMIN in the last 168 hours. No results for input(s):  LIPASE, AMYLASE in the last 168 hours. No results for input(s): AMMONIA in the last 168 hours. CBC: Recent Labs  Lab 07/26/20 1844 07/27/20 0621 07/28/20 0758 07/29/20 0604 07/30/20 0755  WBC  --  4.6 3.5* 3.8* 5.5  HGB 11.5* 12.6* 12.4* 12.3* 13.4  HCT 34.8* 38.3* 36.6* 36.6* 38.8*  MCV  --  104.6* 103.1* 101.1* 100.5*  PLT  --  129* 126* 121* 115*   Cardiac Enzymes: No results for input(s): CKTOTAL, CKMB, CKMBINDEX, TROPONINI in the last 168 hours. Sepsis Labs: Recent Labs  Lab 07/27/20 0621 07/28/20 0758 07/29/20 0604 07/30/20 0755  WBC 4.6 3.5* 3.8* 5.5    Procedures/Operations  none   Kalum Minner 07/31/20 2020/08/25, 4:59 PM

## 2020-08-12 NOTE — Progress Notes (Signed)
Patient's sister Clydie Braun notified of patient death. She and other family members will be here shortly.

## 2020-08-12 NOTE — Progress Notes (Signed)
Family at bedside of deceased patient.

## 2020-08-12 NOTE — Progress Notes (Signed)
   August 13, 2020 0900  Clinical Encounter Type  Visited With Other (Comment) (Pt was not alert.)   As the chaplain on call, I spoke with the nurse. William [name] was not alert and no family was presence. I informed the nurse to call me if the family request pastoral care.   Chaplain Trudie Buckler, [place]

## 2020-08-12 DEATH — deceased

## 2022-04-02 IMAGING — CT CT HEAD W/O CM
3 series · 16 of 47 positions shown, 19 images · non-contrast
Comparison: 12/05/2019, 12/12/2019

CLINICAL DATA: Fell yesterday, found down after 12 hours, alcohol
abuse

EXAM:
CT HEAD WITHOUT CONTRAST
TECHNIQUE: Contiguous axial images were obtained from the base of the skull
through the vertex without intravenous contrast.

[Series 2: head wo · axial · 0.47mm/px · z∈[+397,+562]mm · 10 of 39 slices shown, 13 images]
[im 3/39  brain]
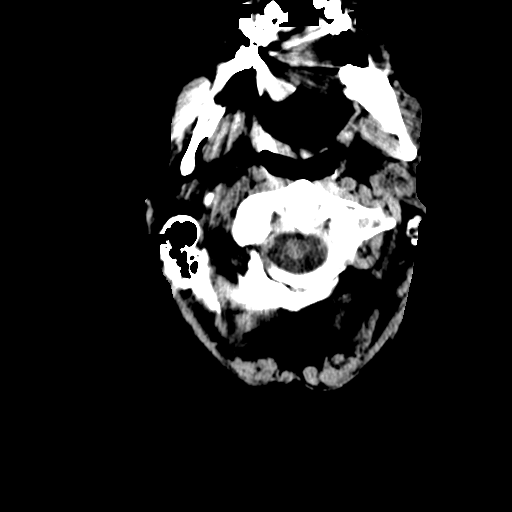
[im 3/39  bone]
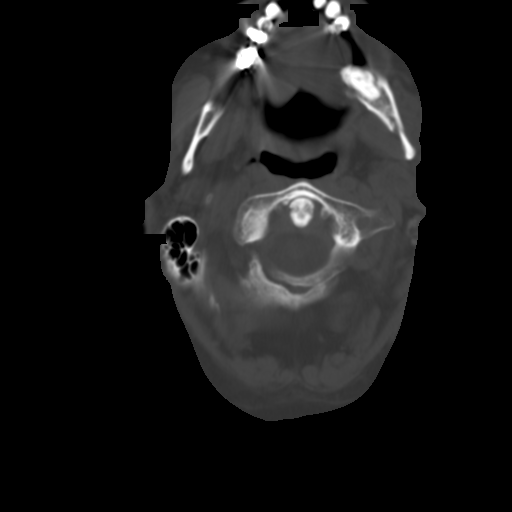
[im 7/39  brain]
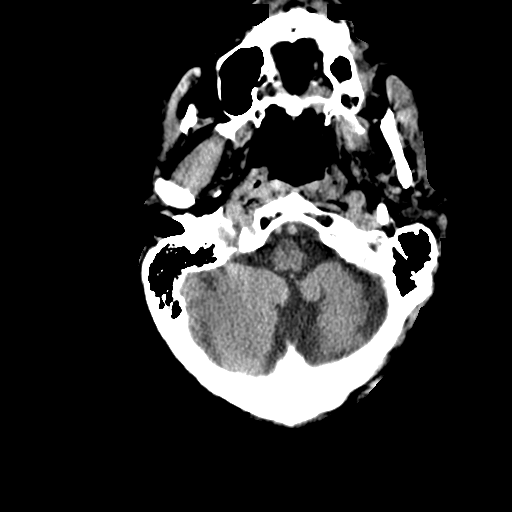
[im 11/39  brain]
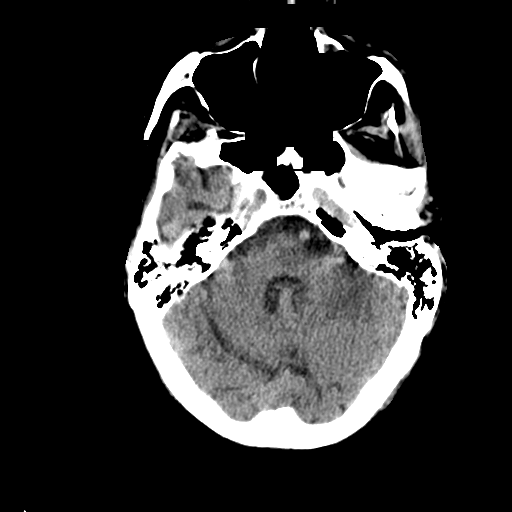
[im 14/39  brain]
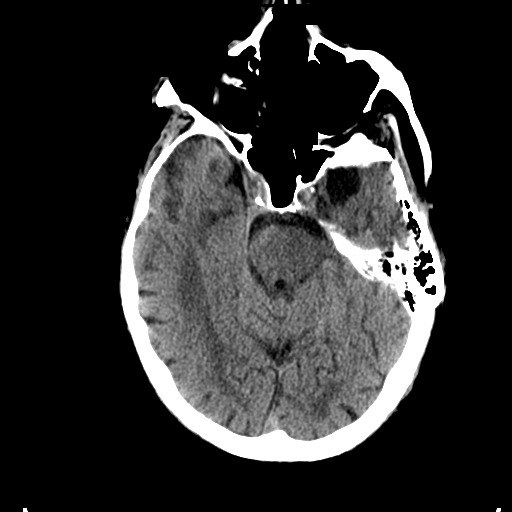
[im 18/39  brain]
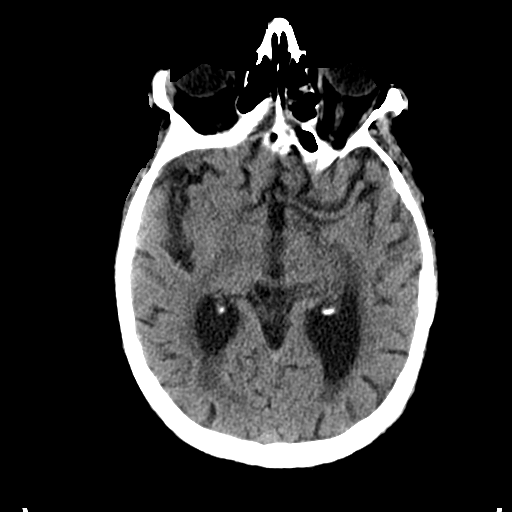
[im 18/39  bone]
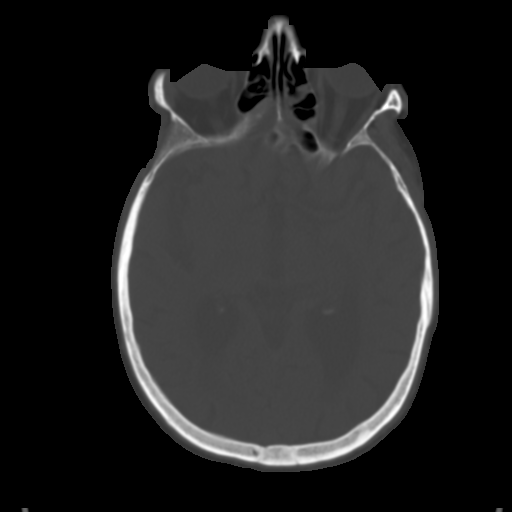
[im 21/39  brain]
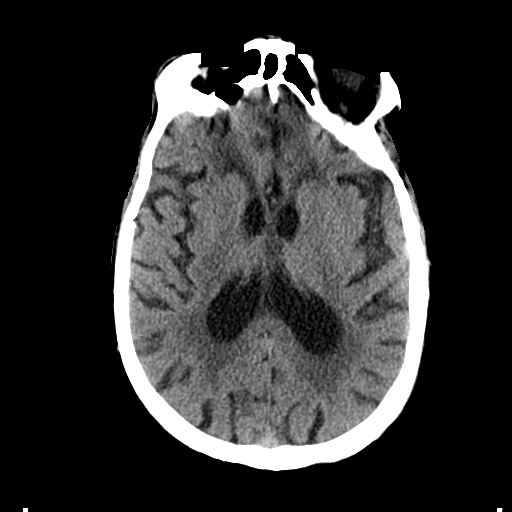
[im 25/39  brain]
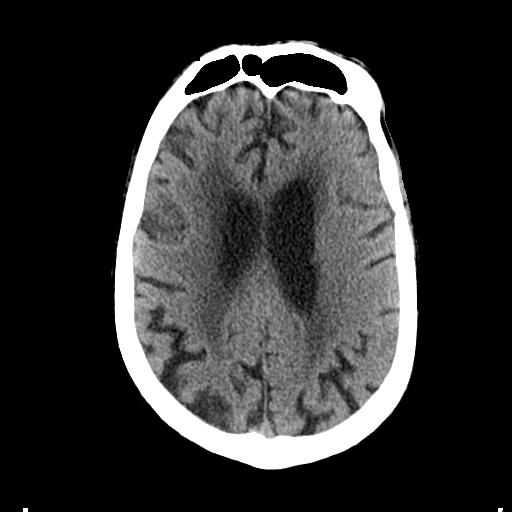
[im 29/39  brain]
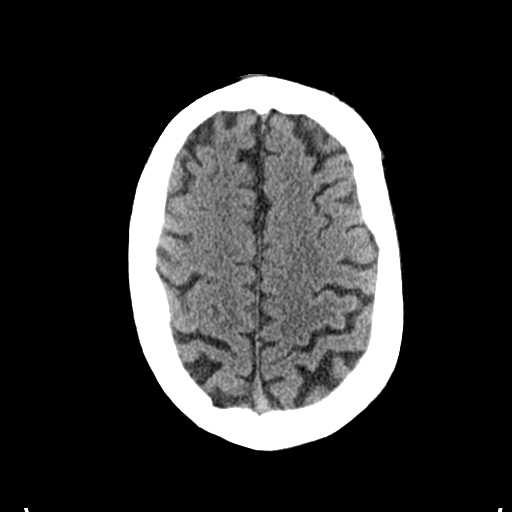
[im 32/39  brain]
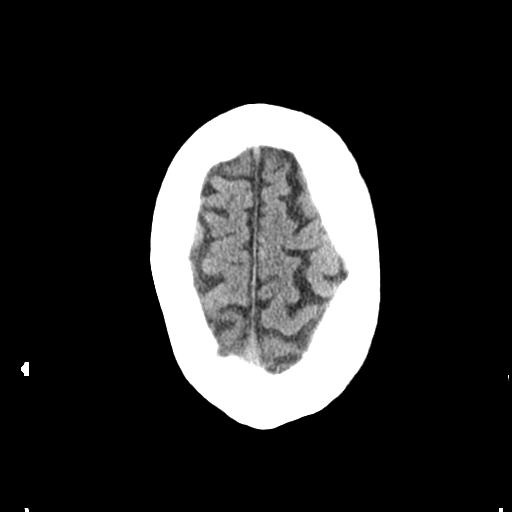
[im 32/39  bone]
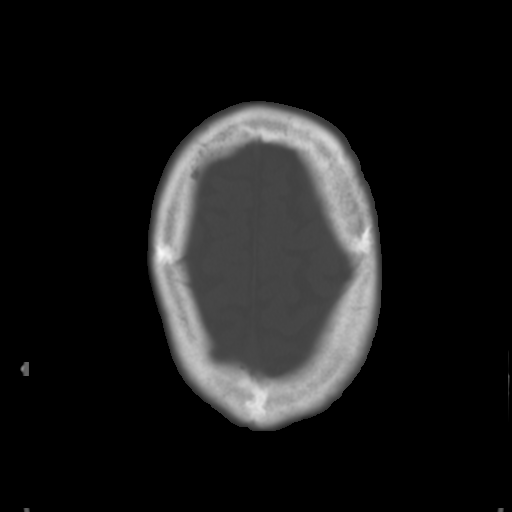
[im 36/39  brain]
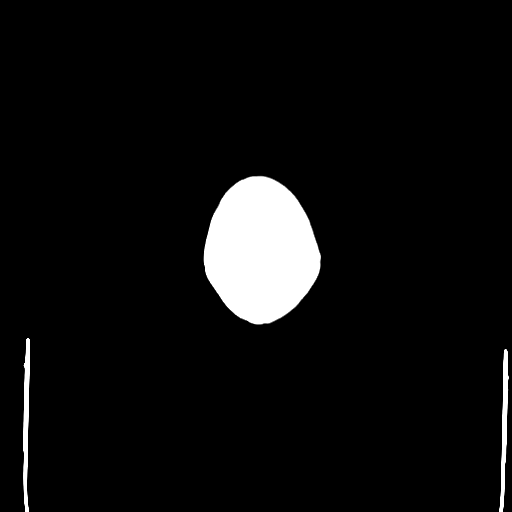

[Series 4: coronal soft tissue · coronal · 0.36mm/px · 3 of 77 slices shown]
[im 28/77  brain]
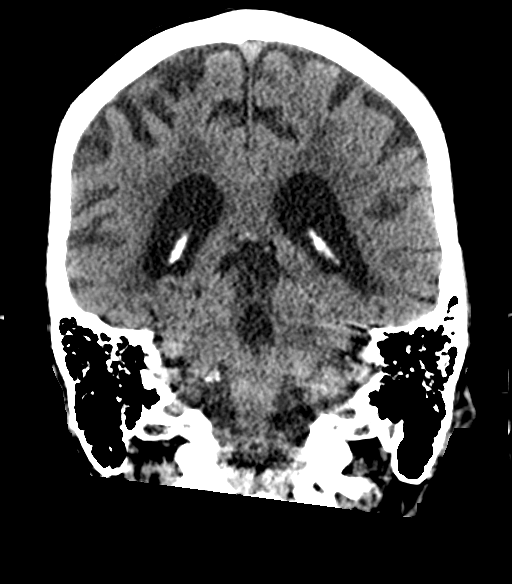
[im 35/77  brain]
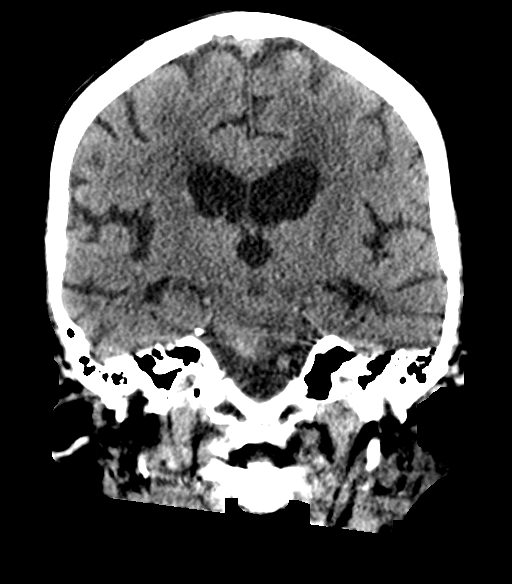
[im 42/77  brain]
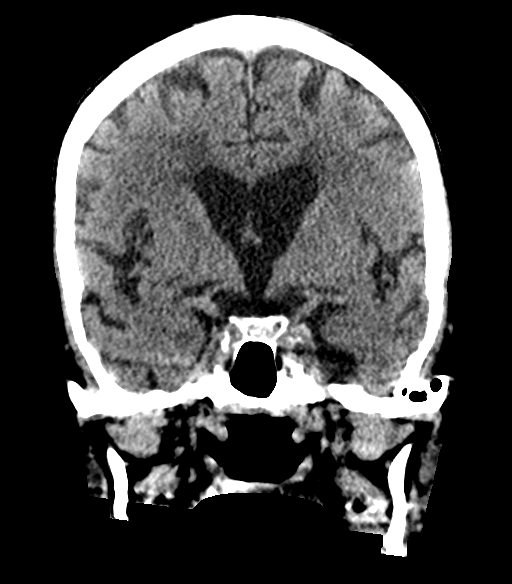

[Series 5: sagittal soft tissue · sagittal · 0.41mm/px · 3 of 58 slices shown]
[im 20/58  brain]
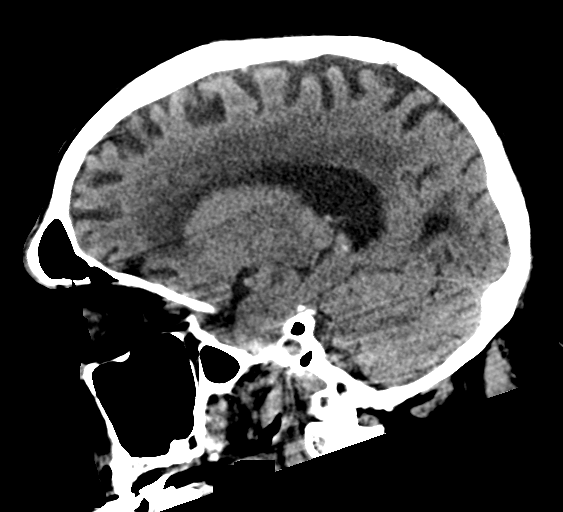
[im 29/58  brain]
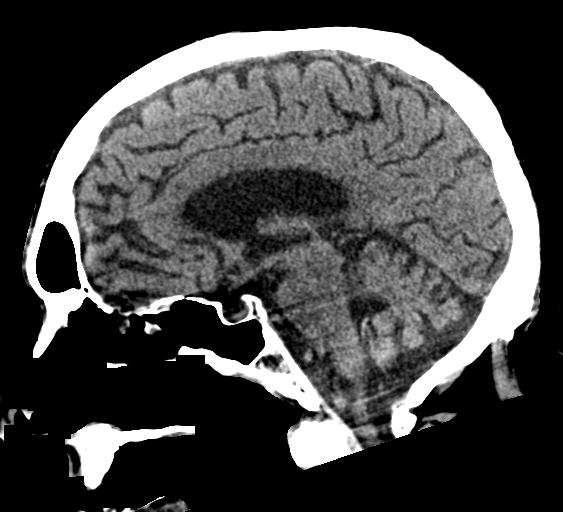
[im 38/58  brain]
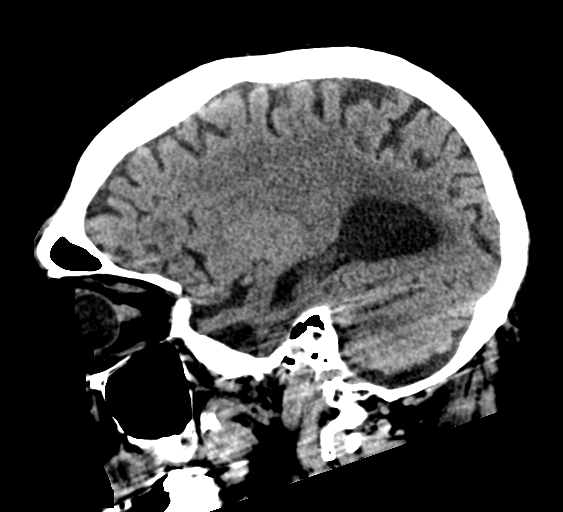

[16 of 47 positions shown; findings below may reference images not displayed]

FINDINGS: Brain: Confluent hypodensities within the periventricular white
matter are compatible with chronic small vessel ischemic changes,
stable since previous exam. Focal hypodensity within the left
thalamus is new since previous study, with an appearance suggesting
a chronic lacunar infarct. No evidence of acute infarct or
hemorrhage. Stable diffuse cerebral atrophy. The lateral ventricles
and midline structures are unremarkable. No acute extra-axial fluid
collections. No mass effect.

Vascular: No hyperdense vessel or unexpected calcification.

Skull: Normal. Negative for fracture or focal lesion.

Sinuses/Orbits: Lamar thickening within the bilateral ethmoid air
cells. Remaining sinuses are clear.

Other: None.
IMPRESSION: 1. No acute intracranial process.
2. Stable chronic small vessel ischemic changes and cerebral
atrophy.
3. Chronic appearing left thalamic lacunar infarct, new since
previous study.
